# Patient Record
Sex: Male | Born: 1965
Health system: Southern US, Community
[De-identification: ages and names within clinical notes are randomized; demographics above are authoritative.]

## PROBLEM LIST (undated history)

## (undated) DIAGNOSIS — M5430 Sciatica, unspecified side: Secondary | ICD-10-CM

## (undated) DIAGNOSIS — Z86010 Personal history of colonic polyps: Secondary | ICD-10-CM

## (undated) DIAGNOSIS — G4730 Sleep apnea, unspecified: Secondary | ICD-10-CM

## (undated) DIAGNOSIS — E785 Hyperlipidemia, unspecified: Secondary | ICD-10-CM

## (undated) DIAGNOSIS — I10 Essential (primary) hypertension: Secondary | ICD-10-CM

## (undated) HISTORY — DX: Hyperlipidemia, unspecified: E78.5

## (undated) HISTORY — DX: Personal history of colonic polyps: Z86.010

## (undated) HISTORY — DX: Sciatica, unspecified side: M54.30

## (undated) HISTORY — DX: Sleep apnea, unspecified: G47.30

## (undated) HISTORY — DX: Essential (primary) hypertension: I10

---

## 2003-03-03 ENCOUNTER — Encounter: Payer: Self-pay | Admitting: Family Medicine

## 2006-06-15 ENCOUNTER — Emergency Department (HOSPITAL_COMMUNITY): Admission: EM | Admit: 2006-06-15 | Discharge: 2006-06-15 | Payer: Self-pay | Admitting: Emergency Medicine

## 2007-04-26 ENCOUNTER — Ambulatory Visit: Payer: Self-pay | Admitting: Family Medicine

## 2007-04-26 DIAGNOSIS — F17200 Nicotine dependence, unspecified, uncomplicated: Secondary | ICD-10-CM | POA: Insufficient documentation

## 2007-04-26 DIAGNOSIS — R5383 Other fatigue: Secondary | ICD-10-CM

## 2007-04-26 DIAGNOSIS — R5381 Other malaise: Secondary | ICD-10-CM

## 2007-04-26 DIAGNOSIS — R6882 Decreased libido: Secondary | ICD-10-CM | POA: Insufficient documentation

## 2007-05-02 ENCOUNTER — Encounter: Payer: Self-pay | Admitting: Family Medicine

## 2007-05-03 ENCOUNTER — Ambulatory Visit: Payer: Self-pay | Admitting: Family Medicine

## 2007-05-03 DIAGNOSIS — E782 Mixed hyperlipidemia: Secondary | ICD-10-CM | POA: Insufficient documentation

## 2007-05-03 LAB — CONVERTED CEMR LAB
BUN: 11 mg/dL (ref 6–23)
Basophils Absolute: 0.1 10*3/uL (ref 0.0–0.1)
Basophils Relative: 1.2 % — ABNORMAL HIGH (ref 0.0–1.0)
CO2: 31 meq/L (ref 19–32)
Calcium: 9 mg/dL (ref 8.4–10.5)
Cholesterol: 223 mg/dL (ref 0–200)
Creatinine, Ser: 0.8 mg/dL (ref 0.4–1.5)
Direct LDL: 146.1 mg/dL
HDL: 27.6 mg/dL — ABNORMAL LOW (ref >39.0)
MCHC: 34.4 g/dL (ref 30.0–36.0)
Monocytes Relative: 12.9 % — ABNORMAL HIGH (ref 3.0–11.0)
Platelets: 225 10*3/uL (ref 150–400)
RBC: 4.93 M/uL (ref 4.22–5.81)
RDW: 13 % (ref 11.5–14.6)
TSH: 0.68 microintl units/mL (ref 0.35–5.50)
Total CHOL/HDL Ratio: 8.1
Triglycerides: 329 mg/dL (ref 0–149)
VLDL: 66 mg/dL — ABNORMAL HIGH (ref 0–40)
Vitamin B-12: 314 pg/mL (ref 211–911)

## 2007-05-06 LAB — CONVERTED CEMR LAB
Sex Hormone Binding: 26 nmol/L (ref 13–71)
Testosterone Free: 122.1 pg/mL (ref 47.0–244.0)

## 2007-07-23 ENCOUNTER — Ambulatory Visit: Payer: Self-pay | Admitting: Family Medicine

## 2007-07-23 DIAGNOSIS — M5430 Sciatica, unspecified side: Secondary | ICD-10-CM | POA: Insufficient documentation

## 2007-12-26 ENCOUNTER — Ambulatory Visit: Payer: Self-pay | Admitting: Family Medicine

## 2007-12-31 ENCOUNTER — Encounter: Payer: Self-pay | Admitting: Family Medicine

## 2008-02-28 ENCOUNTER — Ambulatory Visit: Payer: Self-pay | Admitting: Family Medicine

## 2008-02-28 DIAGNOSIS — R109 Unspecified abdominal pain: Secondary | ICD-10-CM

## 2008-02-28 LAB — CONVERTED CEMR LAB
BUN: 10 mg/dL (ref 6–23)
Chloride: 104 meq/L (ref 96–112)
Creatinine, Ser: 0.9 mg/dL (ref 0.4–1.5)
GFR calc Af Amer: 119 mL/min
GFR calc non Af Amer: 98 mL/min
Glucose, Bld: 100 mg/dL — ABNORMAL HIGH (ref 70–99)
Potassium: 5 meq/L (ref 3.5–5.1)

## 2008-03-02 ENCOUNTER — Ambulatory Visit: Payer: Self-pay | Admitting: Internal Medicine

## 2008-03-03 DIAGNOSIS — R1900 Intra-abdominal and pelvic swelling, mass and lump, unspecified site: Secondary | ICD-10-CM

## 2008-05-21 ENCOUNTER — Ambulatory Visit: Payer: Self-pay | Admitting: Family Medicine

## 2008-05-21 DIAGNOSIS — J069 Acute upper respiratory infection, unspecified: Secondary | ICD-10-CM | POA: Insufficient documentation

## 2008-06-10 ENCOUNTER — Ambulatory Visit: Payer: Self-pay | Admitting: Cardiology

## 2008-08-02 ENCOUNTER — Emergency Department (HOSPITAL_COMMUNITY): Admission: EM | Admit: 2008-08-02 | Discharge: 2008-08-03 | Payer: Self-pay | Admitting: Emergency Medicine

## 2008-11-23 ENCOUNTER — Emergency Department (HOSPITAL_COMMUNITY): Admission: EM | Admit: 2008-11-23 | Discharge: 2008-11-23 | Payer: Self-pay | Admitting: Emergency Medicine

## 2009-02-13 ENCOUNTER — Emergency Department (HOSPITAL_COMMUNITY): Admission: EM | Admit: 2009-02-13 | Discharge: 2009-02-13 | Payer: Self-pay | Admitting: Emergency Medicine

## 2009-08-29 ENCOUNTER — Emergency Department (HOSPITAL_COMMUNITY): Admission: EM | Admit: 2009-08-29 | Discharge: 2009-08-29 | Payer: Self-pay | Admitting: Family Medicine

## 2009-08-30 ENCOUNTER — Emergency Department (HOSPITAL_COMMUNITY): Admission: EM | Admit: 2009-08-30 | Discharge: 2009-08-31 | Payer: Self-pay | Admitting: Emergency Medicine

## 2009-08-30 ENCOUNTER — Emergency Department (HOSPITAL_COMMUNITY): Admission: EM | Admit: 2009-08-30 | Discharge: 2009-08-30 | Payer: Self-pay | Admitting: Emergency Medicine

## 2009-12-30 ENCOUNTER — Emergency Department (HOSPITAL_COMMUNITY): Admission: EM | Admit: 2009-12-30 | Discharge: 2009-12-30 | Payer: Self-pay | Admitting: Emergency Medicine

## 2010-06-14 ENCOUNTER — Ambulatory Visit: Payer: Self-pay | Admitting: Family Medicine

## 2010-06-14 DIAGNOSIS — E785 Hyperlipidemia, unspecified: Secondary | ICD-10-CM | POA: Insufficient documentation

## 2010-06-14 LAB — CONVERTED CEMR LAB
Blood in Urine, dipstick: NEGATIVE
Nitrite: NEGATIVE
Specific Gravity, Urine: 1.02
Urobilinogen, UA: 2
WBC Urine, dipstick: NEGATIVE

## 2010-06-15 LAB — CONVERTED CEMR LAB
ALT: 32 units/L (ref 0–53)
AST: 29 units/L (ref 0–37)
Albumin: 4.3 g/dL (ref 3.5–5.2)
Alkaline Phosphatase: 78 units/L (ref 39–117)
Basophils Absolute: 0 10*3/uL (ref 0.0–0.1)
Basophils Relative: 0.5 % (ref 0.0–3.0)
Calcium: 9.2 mg/dL (ref 8.4–10.5)
Eosinophils Relative: 1.8 % (ref 0.0–5.0)
GFR calc non Af Amer: 100.9 mL/min (ref >60)
Glucose, Bld: 92 mg/dL (ref 70–99)
HCT: 49 % (ref 39.0–52.0)
HDL: 36.6 mg/dL — ABNORMAL LOW (ref >39.00)
Hemoglobin: 16.7 g/dL (ref 13.0–17.0)
Lymphocytes Relative: 29.8 % (ref 12.0–46.0)
Lymphs Abs: 2.3 10*3/uL (ref 0.7–4.0)
Monocytes Relative: 11.5 % (ref 3.0–12.0)
Neutro Abs: 4.4 10*3/uL (ref 1.4–7.7)
Potassium: 4.6 meq/L (ref 3.5–5.1)
RBC: 5.12 M/uL (ref 4.22–5.81)
RDW: 14.1 % (ref 11.5–14.6)
Sodium: 140 meq/L (ref 135–145)
TSH: 0.9 microintl units/mL (ref 0.35–5.50)
Total CHOL/HDL Ratio: 7
Triglycerides: 188 mg/dL — ABNORMAL HIGH (ref 0.0–149.0)
WBC: 7.8 10*3/uL (ref 4.5–10.5)

## 2010-06-18 ENCOUNTER — Encounter: Admission: RE | Admit: 2010-06-18 | Discharge: 2010-06-18 | Payer: Self-pay | Admitting: Family Medicine

## 2010-06-22 DIAGNOSIS — M549 Dorsalgia, unspecified: Secondary | ICD-10-CM | POA: Insufficient documentation

## 2010-10-18 NOTE — Assessment & Plan Note (Signed)
Summary: PT EST (TXR FROM [facility])  OK PER DR FRY // RS   Vital Signs:  Patient profile:   45 year old male Height:      70 inches Weight:      337 pounds BMI:     48.53 Temp:     98.3 degrees F oral BP sitting:   120 / 90  (left arm) Cuff size:   regular CC: Pt to Est from [employer], src Is Patient Diabetic? No   History of Present Illness: 45 yr old male to establish with [place] and for a cpx. he had been seeing Dr. Ermalene Searing, but he switched to here because it is closer to his home and job. he is fasting today. His main compaint is chronic right sided low back pain and pain that radiates down the back of the right leg to the foot. He also has numbness down the right leg off and on. The pain is mostly a dull ache, but it can be more severe at times. No hx of trauma. This has been going on for about 3 years. He tried PT for it a few years ago, but this did not help.  He takes Motrin as needed . Because he cannot exercise the way he used to, he has put in around 100 lbs of weight over the past few years.   Preventive Screening-Counseling & Management  Alcohol-Tobacco     Smoking Status: current     Smoking Cessation Counseling: YES     Packs/Day: 0.5  Current Medications (verified): 1)  Multivitamins   Tabs (Multiple Vitamin) .... Once Daily  Allergies (verified): No Known Drug Allergies  Past History:  Past Medical History: sciatica Hyperlipidemia  Past Surgical History: Reviewed history from 04/26/2007 and no changes required. Denies surgical history  Family History: Reviewed history from 04/26/2007 and no changes required. father age  9 DM mother age 31 healthy 3 sisters, 1 brother: healthy MGF: DM, MI  uncle with  MI < 24, uncle with throat ca  Social History: Reviewed history from 04/26/2007 and no changes required. 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, longterm no abuse Current Smoker 1/2 ppd x 28 years Alcohol use-yes, 2 beers  per week Drug use-yes remote marijuana, cocaine, LSD age 16 Regular exercise-yes wlaks 45 min daily, [employer] occ Diet: fruit veggies, skips meals a lot  Packs/Day:  0.5  Review of Systems  The patient denies anorexia, fever, weight loss, vision loss, decreased hearing, hoarseness, chest pain, syncope, dyspnea on exertion, peripheral edema, prolonged cough, headaches, hemoptysis, abdominal pain, melena, hematochezia, severe indigestion/heartburn, hematuria, incontinence, genital sores, muscle weakness, suspicious skin lesions, transient blindness, difficulty walking, depression, unusual weight change, abnormal bleeding, enlarged lymph nodes, angioedema, breast masses, and testicular masses.    Physical Exam  General:  morbidly obese  Head:  Normocephalic and atraumatic without obvious abnormalities. No apparent alopecia or balding. Eyes:  No corneal or conjunctival inflammation noted. EOMI. Perrla. Funduscopic exam benign, without hemorrhages, exudates or papilledema. Vision grossly normal. Ears:  External ear exam shows no significant lesions or deformities.  Otoscopic examination reveals clear canals, tympanic membranes are intact bilaterally without bulging, retraction, inflammation or discharge. Hearing is grossly normal bilaterally. Nose:  External nasal examination shows no deformity or inflammation. Nasal mucosa are pink and moist without lesions or exudates. Mouth:  Oral mucosa and oropharynx without lesions or exudates.  Teeth in good repair. Neck:  No deformities, masses, or tenderness noted. Chest Wall:  No deformities,  masses, tenderness or gynecomastia noted. Lungs:  Normal respiratory effort, chest expands symmetrically. Lungs are clear to auscultation, no crackles or wheezes. Heart:  Normal rate and regular rhythm. S1 and S2 normal without gallop, murmur, click, rub or other extra sounds. Abdomen:  Bowel sounds positive,abdomen soft and non-tender without masses, organomegaly or  hernias noted. Genitalia:  Testes bilaterally descended without nodularity, tenderness or masses. No scrotal masses or lesions. No penis lesions or urethral discharge. Msk:  No deformity or scoliosis noted of thoracic or lumbar spine.  Not tender in the back but he is tender in the right sciatic notch. Negative SLR  Pulses:  R and L carotid,radial,femoral,dorsalis pedis and posterior tibial pulses are full and equal bilaterally Extremities:  No clubbing, cyanosis, edema, or deformity noted with normal full range of motion of all joints.   Neurologic:  No cranial nerve deficits noted. Station and gait are normal. Plantar reflexes are down-going bilaterally. DTRs are symmetrical throughout. Sensory, motor and coordinative functions appear intact. Skin:  Intact without suspicious lesions or rashes Cervical Nodes:  No lymphadenopathy noted Axillary Nodes:  No palpable lymphadenopathy Inguinal Nodes:  No significant adenopathy Psych:  Cognition and judgment appear intact. Alert and cooperative with normal attention span and concentration. No apparent delusions, illusions, hallucinations   Impression & Recommendations:  Problem # 1:  HEALTH MAINTENANCE EXAM (ICD-V70.0)  Orders: UA Dipstick w/o Micro (automated)  (81003) Venipuncture (42595) TLB-Lipid Panel (80061-LIPID) TLB-BMP (Basic Metabolic Panel-BMET) (80048-METABOL) TLB-CBC Platelet - w/Differential (85025-CBCD) TLB-Hepatic/Liver Function Pnl (80076-HEPATIC) TLB-TSH (Thyroid Stimulating Hormone) (84443-TSH) Specimen Handling (63875)  Problem # 2:  SCIATICA, RIGHT (ICD-724.3)  Complete Medication List: 1)  Multivitamins Tabs (Multiple vitamin) .... Once daily  Patient Instructions: 1)  get labs today.  2)  Tobacco is very bad for your health and your loved ones ! You should stop smoking !  3)  It is important that you exercise reguarly at least 20 minutes 5 times a week. If you develop chest pain, have severe difficulty breathing,  or feel very tired, stop exercising immediately and seek medical attention.  4)  You need to lose weight. Consider a lower calorie diet and regular exercise.  5)  We will set up an MRI of the lumbar spine soon  Laboratory Results   Urine Tests  Date/Time Recieved: June 14, 2010 12:19 PM  Date/Time Reported: June 14, 2010 12:19 PM   Routine Urinalysis   Color: yellow Appearance: Clear Glucose: negative   (Normal Range: Negative) Bilirubin: 1+   (Normal Range: Negative) Ketone: trace (5)   (Normal Range: Negative) Spec. Gravity: 1.020   (Normal Range: 1.003-1.035) Blood: negative   (Normal Range: Negative) pH: 7.0   (Normal Range: 5.0-8.0) Protein: 1+   (Normal Range: Negative) Urobilinogen: 2.0   (Normal Range: 0-1) Nitrite: negative   (Normal Range: Negative) Leukocyte Esterace: negative   (Normal Range: Negative)    Comments: Wynona Canes, CMA  June 14, 2010 12:19 PM

## 2010-10-18 NOTE — Miscellaneous (Signed)
Summary: Orders Update   Clinical Lists Changes  Orders: Added new Referral order of Radiology Referral (Radiology) - Signed 

## 2011-02-03 NOTE — Assessment & Plan Note (Signed)
[facility]                                 ON-CALL NOTE   Jeremy, Guerrero                      MRN:          [id number]  DATE:05/24/2008                            DOB:          Jul 04, 1966    DATE OF INTERACTION:  May 24, 2008 at 5:11 p.m.   PHONE NUMBER:  [phone].   CALLER:  Jorja Loa, [facility].   OBJECTIVE:  Pharmacist has a question, was given Hyco-tuss the other day  for cough and the patient said that he was told to use Zithromax if he  did not improve.  He cannot find his prescription.  He says, he gave it  to the pharmacy.  The pharmacy cannot find it either and they want to  okay to give him a prescription for his Zithromax.  I gave him a verbal  order.  I gave him a Z-PAK as directed.   Primary care Christie Copley is Dr. Ermalene Searing.  Home office is [facility].     Arta Silence, MD  Electronically Signed    RNS/MedQ  DD: 05/24/2008  DT: 05/25/2008  Job #: [id number]

## 2011-04-03 ENCOUNTER — Ambulatory Visit (INDEPENDENT_AMBULATORY_CARE_PROVIDER_SITE_OTHER)
Admission: RE | Admit: 2011-04-03 | Discharge: 2011-04-03 | Disposition: A | Discharge status: Home / Self Care | Payer: PRIVATE HEALTH INSURANCE | Source: Ambulatory Visit | Attending: Family Medicine | Admitting: Family Medicine

## 2011-04-03 ENCOUNTER — Ambulatory Visit (INDEPENDENT_AMBULATORY_CARE_PROVIDER_SITE_OTHER): Payer: PRIVATE HEALTH INSURANCE | Admitting: Family Medicine

## 2011-04-03 ENCOUNTER — Encounter: Payer: Self-pay | Admitting: Family Medicine

## 2011-04-03 VITALS — BP 126/82 | HR 81 | Temp 98.6°F | Wt 348.0 lb

## 2011-04-03 DIAGNOSIS — S9030XA Contusion of unspecified foot, initial encounter: Secondary | ICD-10-CM

## 2011-04-03 NOTE — Progress Notes (Signed)
  Subjective:    Patient ID: Jeremy Guerrero, male    DOB: [birth year], 45 y.o.   MRN: [id number]  HPI Here for an injury to the left foot which occurred at home 2 days ago. He was walking in the dark and ran the foot into an ottoman. He has had pain and swelling in the forefoot ever since. Taking Aleve and using ice.    Review of Systems  Constitutional: Negative.   Musculoskeletal: Positive for joint swelling.       Objective:   Physical Exam  Constitutional:       Walks with a limp   Musculoskeletal:       The left foot is swollen. The 4th toe is ecchymotic and tender. Alignment is good. The 4th MTP joint is quite tender.           Assessment & Plan:  Probable fracture of the toe or metatarsal. Wear supportive shoes. Get Xrays today

## 2011-04-07 ENCOUNTER — Telehealth: Payer: Self-pay | Admitting: *Deleted

## 2011-04-07 ENCOUNTER — Telehealth: Payer: Self-pay

## 2011-04-07 NOTE — Telephone Encounter (Signed)
Talked to patient about x-ray.

## 2011-04-07 NOTE — Telephone Encounter (Signed)
Message copied by Beverely Low on Fri Apr 07, 2011  3:18 PM ------      Message from: Gershon Crane A      Created: Wed Apr 05, 2011  4:59 AM       There is a fracture in the 4th toe at the joint, as expected, but the foot bone is okay. No need for casting. Wear supportive shoes and give it time to heal. Recheck prn

## 2011-04-07 NOTE — Telephone Encounter (Signed)
LMTCB

## 2011-04-13 ENCOUNTER — Telehealth: Payer: Self-pay | Admitting: Family Medicine

## 2011-04-13 NOTE — Telephone Encounter (Signed)
Left voice message with results.

## 2011-04-13 NOTE — Telephone Encounter (Signed)
Message copied by Baldemar Friday on Thu Apr 13, 2011  5:10 PM ------      Message from: Gershon Crane A      Created: Wed Apr 05, 2011  4:59 AM       There is a fracture in the 4th toe at the joint, as expected, but the foot bone is okay. No need for casting. Wear supportive shoes and give it time to heal. Recheck prn

## 2011-06-21 LAB — POCT I-STAT, CHEM 8
BUN: 10
Creatinine, Ser: 1.2
Glucose, Bld: 114 — ABNORMAL HIGH
Sodium: 139
TCO2: 28

## 2011-08-14 ENCOUNTER — Emergency Department (HOSPITAL_COMMUNITY)
Admission: EM | Admit: 2011-08-14 | Discharge: 2011-08-14 | Disposition: A | Discharge status: Home / Self Care | Payer: PRIVATE HEALTH INSURANCE | Attending: Emergency Medicine | Admitting: Emergency Medicine

## 2011-08-14 ENCOUNTER — Encounter (HOSPITAL_COMMUNITY): Payer: Self-pay | Admitting: *Deleted

## 2011-08-14 DIAGNOSIS — R0602 Shortness of breath: Secondary | ICD-10-CM | POA: Insufficient documentation

## 2011-08-14 DIAGNOSIS — R110 Nausea: Secondary | ICD-10-CM | POA: Insufficient documentation

## 2011-08-14 DIAGNOSIS — J029 Acute pharyngitis, unspecified: Secondary | ICD-10-CM

## 2011-08-14 DIAGNOSIS — H6690 Otitis media, unspecified, unspecified ear: Secondary | ICD-10-CM

## 2011-08-14 DIAGNOSIS — J069 Acute upper respiratory infection, unspecified: Secondary | ICD-10-CM | POA: Insufficient documentation

## 2011-08-14 DIAGNOSIS — F17200 Nicotine dependence, unspecified, uncomplicated: Secondary | ICD-10-CM | POA: Insufficient documentation

## 2011-08-14 DIAGNOSIS — H9209 Otalgia, unspecified ear: Secondary | ICD-10-CM | POA: Insufficient documentation

## 2011-08-14 MED ORDER — IBUPROFEN 800 MG PO TABS
800.0000 mg | ORAL_TABLET | Freq: Once | ORAL | Status: AC
  Administered 2011-08-14: 800 mg via ORAL
  Filled 2011-08-14: qty 1

## 2011-08-14 MED ORDER — AZITHROMYCIN 250 MG PO TABS: 250.0000 mg | ORAL_TABLET | Freq: Every day | ORAL | Status: AC

## 2011-08-14 MED ORDER — HYDROCOD POLST-CHLORPHEN POLST 10-8 MG/5ML PO LQCR: 5.0000 mL | Freq: Two times a day (BID) | ORAL | Status: DC

## 2011-08-14 MED ORDER — ALBUTEROL SULFATE HFA 108 (90 BASE) MCG/ACT IN AERS: 2.0000 | INHALATION_SPRAY | Freq: Four times a day (QID) | RESPIRATORY_TRACT | Status: DC

## 2011-08-14 MED ORDER — PENICILLIN G BENZATHINE 1200000 UNIT/2ML IM SUSP
1.2000 10*6.[IU] | Freq: Once | INTRAMUSCULAR | Status: AC
  Administered 2011-08-14: 1.2 10*6.[IU] via INTRAMUSCULAR
  Filled 2011-08-14 (×2): qty 2

## 2011-08-14 MED ORDER — ALBUTEROL SULFATE HFA 108 (90 BASE) MCG/ACT IN AERS
1.0000 | INHALATION_SPRAY | Freq: Four times a day (QID) | RESPIRATORY_TRACT | Status: DC | PRN
Start: 2011-08-14 — End: 2016-02-09

## 2011-08-14 NOTE — ED Provider Notes (Addendum)
History     CSN: [id number] Arrival date & time: 08/14/2011  6:12 AM   First MD Initiated Contact with Patient 08/14/11 [phone]      Chief Complaint  Patient presents with  . Otalgia  . Sore Throat  . Nausea  . Fever  . Shortness of Breath   patient had sore throat, beginning on Wednesday. Other symptoms began Thursday night, including earache, sore throat, nausea.  He states the sore throat is worsening. He is also having a left-sided earache. He is a smoker, but denies any other past medical history. He has had low-grade temps only. She denies any chest pain or any exertional dyspnea. He's had no back pain. No headache. Patient denies any recent sick contacts.  (Consider location/radiation/quality/duration/timing/severity/associated sxs/prior treatment) HPI  History reviewed. No pertinent past medical history.  History reviewed. No pertinent past surgical history.  History reviewed. No pertinent family history.  History  Substance Use Topics  . Smoking status: Current Everyday Smoker -- 0.5 packs/day    Types: Cigarettes  . Smokeless tobacco: Never Used  . Alcohol Use: Yes     5 beers a month      Review of Systems  All other systems reviewed and are negative.    Allergies  Review of patient's allergies indicates no known allergies.  Home Medications   Current Outpatient Rx  Name Route Sig Dispense Refill  . CHOLESTEROL DEFENSE PO Oral Take by mouth daily.        BP 139/74  Pulse 112  Temp(Src) 100.7 F (38.2 C) (Oral)  Resp 20  SpO2 93%  Physical Exam  Constitutional: He is oriented to person, place, and time. He appears well-developed and well-nourished. No distress.       Large frame male who is awake, alert, oriented, no acute distress. Nontoxic in appearance. He is speaking comfortably. No diaphoresis.  HENT:  Head: Normocephalic and atraumatic.  Right Ear: External ear normal.  Mouth/Throat: Oropharyngeal exudate present.       Oral pharynx  clear. Other than trace exudate on the left tonsillar pillar, airways patent, no abscess. Left tympanic membrane is erythematous.  Eyes: Conjunctivae and EOM are normal. Pupils are equal, round, and reactive to light.  Neck: Normal range of motion. Neck supple. No tracheal deviation present. No thyromegaly present.  Cardiovascular: Normal rate and regular rhythm.  Exam reveals no gallop and no friction rub.   No murmur heard. Pulmonary/Chest: Breath sounds normal. No respiratory distress. He has no wheezes. He has no rales. He exhibits no tenderness.  Abdominal: Soft. Bowel sounds are normal. He exhibits no distension. There is no tenderness. There is no rebound and no guarding.  Musculoskeletal: Normal range of motion. He exhibits no edema and no tenderness.  Lymphadenopathy:    He has no cervical adenopathy.  Neurological: He is alert and oriented to person, place, and time. No cranial nerve deficit. Coordination normal.  Skin: Skin is warm and dry. No rash noted.  Psychiatric: He has a normal mood and affect.    ED Course  Procedures (including critical care time)   Labs Reviewed  RAPID STREP SCREEN   No results found.   No diagnosis found.    MDM  Pt is seen and examined;  Initial history and physical completed.  Will follow.          Kesleigh Morson A. Patrica Duel, MD 08/14/11 0725  Results for orders placed during the hospital encounter of 08/14/11  RAPID STREP SCREEN  Component Value Range   Streptococcus, Group A Screen (Direct) NEGATIVE  NEGATIVE    No results found.    Evelynne Spiers A. Patrica Duel, MD 08/14/11 [phone]

## 2011-08-14 NOTE — Discharge Instructions (Signed)
Pharyngitis, Viral and Bacterial Pharyngitis is soreness (inflammation) or infection of the pharynx. It is also called a sore throat. CAUSES  Most sore throats are caused by viruses and are part of a cold. However, some sore throats are caused by strep and other bacteria. Sore throats can also be caused by post nasal drip from draining sinuses, allergies and sometimes from sleeping with an open mouth. Infectious sore throats can be spread from person to person by coughing, sneezing and sharing cups or eating utensils. TREATMENT  Sore throats that are viral usually last 3-4 days. Viral illness will get better without medications (antibiotics). Strep throat and other bacterial infections will usually begin to get better about 24-48 hours after you begin to take antibiotics. HOME CARE INSTRUCTIONS   If the caregiver feels there is a bacterial infection or if there is a positive strep test, they will prescribe an antibiotic. The full course of antibiotics must be taken. If the full course of antibiotic is not taken, you or your child may become ill again. If you or your child has strep throat and do not finish all of the medication, serious heart or kidney diseases may develop.   Drink enough water and fluids to keep your urine clear or pale yellow.   Only take over-the-counter or prescription medicines for pain, discomfort or fever as directed by your caregiver.   Get lots of rest.   Gargle with salt water ( tsp. of salt in a glass of water) as often as every 1-2 hours as you need for comfort.   Hard candies may soothe the throat if individual is not at risk for choking. Throat sprays or lozenges may also be used.  SEEK MEDICAL CARE IF:   Large, tender lumps in the neck develop.   A rash develops.   Green, yellow-brown or bloody sputum is coughed up.   Your baby is older than 3 months with a rectal temperature of 100.5 F (38.1 C) or higher for more than 1 day.  SEEK IMMEDIATE MEDICAL CARE  IF:   A stiff neck develops.   You or your child are drooling or unable to swallow liquids.   You or your child are vomiting, unable to keep medications or liquids down.   You or your child has severe pain, unrelieved with recommended medications.   You or your child are having difficulty breathing (not due to stuffy nose).   You or your child are unable to fully open your mouth.   You or your child develop redness, swelling, or severe pain anywhere on the neck.   You have a fever.   Your baby is older than 3 months with a rectal temperature of 102 F (38.9 C) or higher.   Your baby is 37 months old or younger with a rectal temperature of 100.4 F (38 C) or higher.  MAKE SURE YOU:   Understand these instructions.   Will watch your condition.   Will get help right away if you are not doing well or get worse.  Document Released: 09/04/2005 Document Revised: 05/17/2011 Document Reviewed: 12/02/2007 [facility] Patient Information 2012 [place], [place].   Otitis Media, Adult A middle ear infection is an infection in the space behind the eardrum. The medical name for this is "otitis media." It may happen after a common cold. It is caused by a germ that starts growing in that space. You may feel swollen glands in your neck on the side of the ear infection. HOME CARE INSTRUCTIONS  Take your medicine as directed until it is gone, even if you feel better after the first few days.   Only take over-the-counter or prescription medicines for pain, discomfort, or fever as directed by your caregiver.   Occasional use of a nasal decongestant a couple times per day may help with discomfort and help the eustachian tube to drain better.  Follow up with your caregiver in 10 to 14 days or as directed, to be certain that the infection has cleared. Not keeping the appointment could result in a chronic or permanent injury, pain, hearing loss and disability. If there is any problem keeping the  appointment, you must call back to this facility for assistance. SEEK IMMEDIATE MEDICAL CARE IF:   You are not getting better in 2 to 3 days.   You have pain that is not controlled with medication.   You feel worse instead of better.   You cannot use the medication as directed.   You develop swelling, redness or pain around the ear or stiffness in your neck.  MAKE SURE YOU:   Understand these instructions.   Will watch your condition.   Will get help right away if you are not doing well or get worse.  Document Released: 06/09/2004 Document Revised: 05/17/2011 Document Reviewed: 04/10/2008 [facility] Patient Information 2012 [place], [place].   RESOURCE GUIDE  Dental Problems  Patients with Medicaid: [facility] [phone] [address]                                           [phone] [address] Phone:  [phone]                                                  Phone:  [phone]  If unable to pay or uninsured, contact:  [facility] or [facility]. to become qualified for the adult dental clinic.  Chronic Pain Problems Contact [facility]  [phone] Patients need to be referred by their primary care doctor.  Insufficient Money for Medicine Contact United Way:  call "211" or [facility] 325-735-7688.  No Primary Care Doctor Call [facility]  [phone] Other agencies that provide inexpensive medical care    [facility]  [phone]    [facility]  [phone]    [facility]  [phone]    [facility]  [phone]    [facility]  [phone]    [facility]  [phone]  Psychological Services [facility]  [phone] [facility] Services  [phone] [facility]   [phone] (emergency services 7793606554)  Substance Abuse Resources [facility]  [phone] [facility]  [phone] [facility] [phone] Floydene Flock [phone] Residential & Outpatient Substance Abuse Program  ([phone]  Abuse/Neglect [facility] Child Abuse Hotline 4236375511 [facility] Child Abuse Hotline 854-519-4459 (After Hours)  Emergency Shelter [facility] (307)[phone]  Maternity Homes Room at the [place] [facility] [place] [phone] [facility] Services [phone]  MRSA Hotline #:   2243662253  [facility]  [facility]     [employer]                          [facility] Dept. [address] [place]                       [address]      [address]  Blondell Reveal Phone:  [phone]                                   Phone:  [phone]                 Phone:  [phone]  [facility] Phone:  [phone]  [facility] Child Abuse Hotline [phone] (After Hours)

## 2011-08-14 NOTE — ED Notes (Signed)
Pt c/o sore throat starting on Wed. All other s/s started Thursday night.

## 2011-08-14 NOTE — ED Notes (Signed)
Called called pharmacy x3 because albuterol is unavailable in this pyxis. Still no albuterol, notified md and he wrote for prescription

## 2011-08-15 ENCOUNTER — Encounter: Payer: Self-pay | Admitting: Family Medicine

## 2011-08-15 ENCOUNTER — Ambulatory Visit (INDEPENDENT_AMBULATORY_CARE_PROVIDER_SITE_OTHER): Payer: PRIVATE HEALTH INSURANCE | Admitting: Family Medicine

## 2011-08-15 VITALS — BP 120/84 | HR 98 | Temp 99.1°F | Wt 340.0 lb

## 2011-08-15 DIAGNOSIS — J40 Bronchitis, not specified as acute or chronic: Secondary | ICD-10-CM

## 2011-08-15 NOTE — Progress Notes (Signed)
  Subjective:    Patient ID: Jeremy Guerrero, male    DOB: Oct 24, 1965, 45 y.o.   MRN: [id number]  HPI Here for 5 days of fevers to 102 degrees, chest tightness, coughing up yellow sputum, and a ST. He went to the ER yesterday and was given a shot of Bicillin and a Zpack. He is using an inhaler prn. Today he feels no better. Drinking fluids and taking Tylenol   Review of Systems  Constitutional: Positive for fever.  HENT: Negative.   Eyes: Negative.   Respiratory: Positive for cough and chest tightness.        Objective:   Physical Exam  Constitutional: He appears well-developed and well-nourished.  HENT:  Right Ear: External ear normal.  Left Ear: External ear normal.  Nose: Nose normal.  Mouth/Throat: Oropharynx is clear and moist. No oropharyngeal exudate.  Eyes: Conjunctivae are normal.  Neck: No thyromegaly present.  Pulmonary/Chest: Effort normal. No respiratory distress. He has no wheezes. He has no rales.  Lymphadenopathy:    He has no cervical adenopathy.          Assessment & Plan:  Continue with the current meds. We will add a shot of Depomedrol today. Off work yesterday and today

## 2011-12-04 ENCOUNTER — Ambulatory Visit (INDEPENDENT_AMBULATORY_CARE_PROVIDER_SITE_OTHER): Payer: 59 | Admitting: Family

## 2011-12-04 ENCOUNTER — Encounter: Payer: Self-pay | Admitting: Family

## 2011-12-04 VITALS — BP 120/76 | Temp 98.7°F | Resp 18

## 2011-12-04 DIAGNOSIS — J069 Acute upper respiratory infection, unspecified: Secondary | ICD-10-CM

## 2011-12-04 DIAGNOSIS — R0982 Postnasal drip: Secondary | ICD-10-CM

## 2011-12-04 MED ORDER — FEXOFENADINE-PSEUDOEPHED ER 180-240 MG PO TB24: 1.0000 | ORAL_TABLET | Freq: Every day | ORAL | Status: DC

## 2011-12-04 MED ORDER — AMOXICILLIN 500 MG PO TABS: 1000.0000 mg | ORAL_TABLET | Freq: Two times a day (BID) | ORAL | Status: AC

## 2011-12-04 NOTE — Progress Notes (Signed)
Subjective:    Patient ID: Jeremy Guerrero, male    DOB: [birth year], 46 y.o.   MRN: [id number]  HPI 46 year old white male, half a pack per day smoker, patient of Dr. Clent Ridges his in with complaints of sore throat, sinus pressure and she'll and one on for one day. He is taking 1 Mucinex with no relief. Denies any sneezing, coughing, or congestion. Patient is concerned that he is getting sick and he is headed out of town. Patient reports a history of strep pharyngitis but upon review of the notes his strep test was negative. Denies any lightheadedness, dizziness, chest pain, palpitations, shortness of breath or edema.   Review of Systems  Constitutional: Positive for chills.  HENT: Positive for ear pain, sore throat and postnasal drip. Negative for sneezing.   Eyes: Negative.   Respiratory: Negative.  Negative for cough and wheezing.   Cardiovascular: Negative.   Gastrointestinal: Negative.   Skin: Negative.   Neurological: Negative.   Psychiatric/Behavioral: Negative.    Past Medical History  Diagnosis Date  . Sciatica   . Hyperlipidemia     History   Social History  . Marital Status: Single    Spouse Name: N/A    Number of Children: N/A  . Years of Education: N/A   Occupational History  . Not on file.   Social History Main Topics  . Smoking status: Current Everyday Smoker -- 0.5 packs/day    Types: Cigarettes  . Smokeless tobacco: Never Used  . Alcohol Use: Yes     5 beers a month  . Drug Use: No  . Sexually Active: Yes    Birth Control/ Protection: None   Other Topics Concern  . Not on file   Social History Narrative  . No narrative on file    No past surgical history on file.  Family History  Problem Relation Age of Onset  . Healthy Mother   . Diabetes Father   . Healthy Sister   . Healthy Brother   . Diabetes Maternal Grandfather   . Heart attack Maternal Grandfather   . Healthy Sister   . Healthy Sister   . Heart attack Other   . Cancer Other    throat    No Known Allergies  Current Outpatient Prescriptions on File Prior to Visit  Medication Sig Dispense Refill  . albuterol (PROVENTIL HFA;VENTOLIN HFA) 108 (90 BASE) MCG/ACT inhaler Inhale 1-2 puffs into the lungs every 6 (six) hours as needed for wheezing.  1 Inhaler  0  . guaiFENesin (MUCINEX) 600 MG 12 hr tablet Take 600 mg by mouth 2 (two) times daily.        . Nutritional Supplements (CHOLESTEROL DEFENSE PO) Take 1 tablet by mouth daily.       . Pseudoeph-Doxylamine-DM-APAP (NYQUIL PO) Take 5 mLs by mouth at bedtime as needed. Cough        . Pseudoephedrine-APAP-DM (DAYQUIL PO) Take 5 mLs by mouth daily as needed. Cough        . chlorpheniramine-HYDROcodone (TUSSIONEX PENNKINETIC ER) 10-8 MG/5ML LQCR Take 5 mLs by mouth every 12 (twelve) hours.  100 mL  0    BP 120/76  Temp(Src) 98.7 F (37.1 C) (Oral)  Resp 18chart    Objective:   Physical Exam  Constitutional: He is oriented to person, place, and time. He appears well-developed and well-nourished.  HENT:  Right Ear: External ear normal.  Left Ear: External ear normal.  Mouth/Throat: Oropharynx is clear and moist.  Neck: Normal range of  motion. Neck supple.  Cardiovascular: Normal rate, regular rhythm and normal heart sounds.   Pulmonary/Chest: Effort normal and breath sounds normal.  Musculoskeletal: Normal range of motion.  Neurological: He is alert and oriented to person, place, and time.  Skin: Skin is warm and dry.  Psychiatric: He has a normal mood and affect.          Assessment & Plan:  Assessment: Upper respiratory infection, post nasal drip  Plan: I had discussed with patient that I don't believe he needs antibiotics at this point since his symptoms started last night. I have advised Allegra-D once a day. Since he is going out of town, I will write a prescription from [facility] but only be taken if his symptoms worsen or persist. Recheck as scheduled and when necessary.

## 2011-12-04 NOTE — Patient Instructions (Signed)

## 2012-06-27 ENCOUNTER — Emergency Department (HOSPITAL_COMMUNITY)
Admission: EM | Admit: 2012-06-27 | Discharge: 2012-06-28 | Disposition: A | Discharge status: Home / Self Care | Payer: PRIVATE HEALTH INSURANCE | Attending: Emergency Medicine | Admitting: Emergency Medicine

## 2012-06-27 ENCOUNTER — Encounter (HOSPITAL_COMMUNITY): Payer: Self-pay | Admitting: *Deleted

## 2012-06-27 DIAGNOSIS — Z808 Family history of malignant neoplasm of other organs or systems: Secondary | ICD-10-CM | POA: Insufficient documentation

## 2012-06-27 DIAGNOSIS — F17200 Nicotine dependence, unspecified, uncomplicated: Secondary | ICD-10-CM | POA: Insufficient documentation

## 2012-06-27 DIAGNOSIS — Z8249 Family history of ischemic heart disease and other diseases of the circulatory system: Secondary | ICD-10-CM | POA: Insufficient documentation

## 2012-06-27 DIAGNOSIS — L089 Local infection of the skin and subcutaneous tissue, unspecified: Secondary | ICD-10-CM | POA: Insufficient documentation

## 2012-06-27 DIAGNOSIS — Z833 Family history of diabetes mellitus: Secondary | ICD-10-CM | POA: Insufficient documentation

## 2012-06-27 NOTE — ED Notes (Signed)
Pt has a mole to upper right chest he tried to cut off with a razor blade; states had a lot of bleeding at the time; no bleeding now

## 2012-06-28 NOTE — ED Provider Notes (Signed)
History     CSN: [id number]  Arrival date & time 06/27/12  2028   First MD Initiated Contact with Patient 06/27/12 2223      Chief Complaint  Patient presents with  . Laceration    (Consider location/radiation/quality/duration/timing/severity/associated sxs/prior treatment) HPI Comments: Jeremy Guerrero is a 46 y.o. Male who presents with complaint of a tender, infected, bleeding "skin tag." Pt state he had a flesh colored skin tag to the right neck. States had it for "years" but it has been gradually getting larger. States about a week ago, he read on the Internet that he could tie a string around it and it would fall off. Pt states he did that. Few days ago, noted that the tag changed color to a purple-reddish tint, and became tender. Today it started draining puss from where string was tied. States he then decided to use a razor and cut it off. States he made a small cut, but started bleeding and could not stop the bleeding with pressure so decided to come here. Pt denies easy bleeding or bruising, no blood thinners. No fever, chills, malaise. No other complaints.    Past Medical History  Diagnosis Date  . Sciatica   . Hyperlipidemia     History reviewed. No pertinent past surgical history.  Family History  Problem Relation Age of Onset  . Healthy Mother   . Diabetes Father   . Healthy Sister   . Healthy Brother   . Diabetes Maternal Grandfather   . Heart attack Maternal Grandfather   . Healthy Sister   . Healthy Sister   . Heart attack Other   . Cancer Other     throat    History  Substance Use Topics  . Smoking status: Current Every Day Smoker -- 0.5 packs/day    Types: Cigarettes  . Smokeless tobacco: Never Used  . Alcohol Use: Yes     5 beers a month      Review of Systems  Constitutional: Negative for fever and chills.  Respiratory: Negative.   Cardiovascular: Negative.   Skin: Positive for color change and wound.  Neurological: Negative for weakness  and numbness.  Hematological: Does not bruise/bleed easily.    Allergies  Review of patient's allergies indicates no known allergies.  Home Medications   Current Outpatient Rx  Name Route Sig Dispense Refill  . ALBUTEROL SULFATE HFA 108 (90 BASE) MCG/ACT IN AERS Inhalation Inhale 1-2 puffs into the lungs every 6 (six) hours as needed for wheezing. 1 Inhaler 0  . ADULT MULTIVITAMIN W/MINERALS CH Oral Take 1 tablet by mouth daily.    . CHOLESTEROL DEFENSE PO Oral Take 1 tablet by mouth daily.       BP 120/75  Pulse 76  Temp 98.4 F (36.9 C) (Oral)  Resp 18  SpO2 92%  Physical Exam  Nursing note and vitals reviewed. Constitutional: He appears well-developed and well-nourished. No distress.  Cardiovascular: Normal rate, regular rhythm and normal heart sounds.   Pulmonary/Chest: Effort normal and breath sounds normal. No respiratory distress. He has no wheezes. He has no rales.  Skin: Skin is warm and dry.       2 x2 cm pedunculated skin lesion to the right lower neck. It is purplish in color. There is a thread tied tightly around it. It is tender to palpation. There is minimal bleeding from the base of the lesion. No surrounding swelling or erythema. No purulent drainage noted.   Psychiatric: He has a normal mood  and affect.    ED Course  Procedures (including critical care time)   Excision of a skin lesion Performed by: Jaynie Crumble A Consent: Verbal consent obtained. Risks and benefits: risks, benefits and alternatives were discussed Type: skin growths  Body area: left neck  Anesthesia: local infiltration  Local anesthetic: lidocaine 2% w/ epinephrine  Anesthetic total: 2 ml  Using a #10 blade i removed the skin tag/lesion. Wound irrigated with saline. Closed with prolene 5.0, 3 stitches, simple interrupted. Wound hemostatic.    Patient tolerance: Patient tolerated the procedure well with no immediate complications.   Specimen removed sent to pathology.      1. Infected lesion of skin       MDM          Lottie Mussel, PA 06/28/12 0107

## 2012-06-28 NOTE — ED Notes (Signed)
Dressing applied to pt's right side of neck per PA request. Pt tolerated well.

## 2012-06-29 NOTE — ED Provider Notes (Signed)
Medical screening examination/treatment/procedure(s) were performed by non-physician practitioner and as supervising physician I was immediately available for consultation/collaboration.    Layia Walla R Moira Umholtz, MD 06/29/12 1650 

## 2012-07-05 ENCOUNTER — Encounter: Payer: Self-pay | Admitting: Family

## 2012-07-05 ENCOUNTER — Ambulatory Visit (INDEPENDENT_AMBULATORY_CARE_PROVIDER_SITE_OTHER): Payer: PRIVATE HEALTH INSURANCE | Admitting: Family

## 2012-07-05 VITALS — HR 85 | Temp 98.6°F | Wt 349.0 lb

## 2012-07-05 DIAGNOSIS — S1191XA Laceration without foreign body of unspecified part of neck, initial encounter: Secondary | ICD-10-CM

## 2012-07-05 DIAGNOSIS — Z4802 Encounter for removal of sutures: Secondary | ICD-10-CM

## 2012-07-05 DIAGNOSIS — S1190XA Unspecified open wound of unspecified part of neck, initial encounter: Secondary | ICD-10-CM

## 2012-07-05 NOTE — Progress Notes (Signed)
  Subjective:    Patient ID: Jeremy Guerrero, male    DOB: 28-Oct-1965, 46 y.o.   MRN: [id number]  HPI  Patient is in today for suture removal. He has 3 sutures inserted after he attempted to remove a mold from his right neck.  Review of Systems  Constitutional: Negative.   Skin: Positive for wound.       3 sutures to the right neck.       Objective:   Physical Exam  Cardiovascular: Normal rate, regular rhythm and normal heart sounds.   Pulmonary/Chest: Effort normal and breath sounds normal.  Skin: Skin is warm and dry.       Edges well approximated. Well healed 1 cm wound to the right neck. No s/s of infection. No drainage.           Assessment & Plan:  Assessment: Suture removal  Plan: Well healed. Recheck as needed.

## 2012-07-12 ENCOUNTER — Encounter: Payer: Self-pay | Admitting: Family Medicine

## 2012-07-12 ENCOUNTER — Ambulatory Visit (INDEPENDENT_AMBULATORY_CARE_PROVIDER_SITE_OTHER): Payer: PRIVATE HEALTH INSURANCE | Admitting: Family Medicine

## 2012-07-12 VITALS — BP 130/90 | Temp 98.2°F | Wt 345.0 lb

## 2012-07-12 DIAGNOSIS — M65849 Other synovitis and tenosynovitis, unspecified hand: Secondary | ICD-10-CM

## 2012-07-12 DIAGNOSIS — M778 Other enthesopathies, not elsewhere classified: Secondary | ICD-10-CM

## 2012-07-12 DIAGNOSIS — M65839 Other synovitis and tenosynovitis, unspecified forearm: Secondary | ICD-10-CM

## 2012-07-12 MED ORDER — DICLOFENAC SODIUM 75 MG PO TBEC: 75.0000 mg | DELAYED_RELEASE_TABLET | Freq: Two times a day (BID) | ORAL | Status: DC

## 2012-07-12 NOTE — Progress Notes (Signed)
  Subjective:    Patient ID: Jeremy Guerrero, male    DOB: [birth year], 46 y.o.   MRN: [id number]  HPI Here for 2 days of a sharp pain in the left hand. 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 Using hot Epsom salt soaks and Aleve.   Review of Systems  Constitutional: Negative.   Musculoskeletal: Positive for arthralgias.       Objective:   Physical Exam  Constitutional: He appears well-developed and well-nourished.  Musculoskeletal:       Tender over the left palm at the base of the flexor tendons on the 2nd and 3rd fingers. No swelling or masses. No erythema or warmth. Full ROM          Assessment & Plan:  Tendonitis. Reduce inflammation with Diclofenac. Wear a left hand and wrist brace. Recheck prn

## 2013-01-20 ENCOUNTER — Ambulatory Visit (INDEPENDENT_AMBULATORY_CARE_PROVIDER_SITE_OTHER): Payer: 59 | Admitting: Family Medicine

## 2013-01-20 ENCOUNTER — Encounter: Payer: Self-pay | Admitting: Family Medicine

## 2013-01-20 VITALS — BP 140/80 | HR 96 | Temp 98.9°F | Wt 338.0 lb

## 2013-01-20 DIAGNOSIS — M722 Plantar fascial fibromatosis: Secondary | ICD-10-CM

## 2013-01-20 MED ORDER — DICLOFENAC SODIUM 75 MG PO TBEC: 75.0000 mg | DELAYED_RELEASE_TABLET | Freq: Two times a day (BID) | ORAL | Status: DC

## 2013-01-20 NOTE — Progress Notes (Signed)
  Subjective:    Patient ID: Jeremy Guerrero, male    DOB: [birth year], 47 y.o.   MRN: [id number]  HPI Here for 2 weeks of pain in the bottom of the left foot. No recent trauma. Advil and hot soaks help a little. He is on his feet all day at his job.    Review of Systems  Constitutional: Negative.   Musculoskeletal: Positive for arthralgias.       Objective:   Physical Exam  Constitutional: He appears well-developed and well-nourished.  Limping   Musculoskeletal:  Very tender along the arch of the left foot, no erythema or swelling          Assessment & Plan:  Rest, wear arch supports inside his shoes. Add Diclofenac bid.

## 2013-07-10 ENCOUNTER — Ambulatory Visit (HOSPITAL_COMMUNITY): Payer: 59 | Attending: Family Medicine

## 2013-07-10 ENCOUNTER — Ambulatory Visit (INDEPENDENT_AMBULATORY_CARE_PROVIDER_SITE_OTHER): Payer: 59 | Admitting: Family Medicine

## 2013-07-10 ENCOUNTER — Encounter: Payer: Self-pay | Admitting: Family Medicine

## 2013-07-10 VITALS — BP 130/90 | HR 89 | Temp 98.5°F | Wt 343.0 lb

## 2013-07-10 DIAGNOSIS — F17200 Nicotine dependence, unspecified, uncomplicated: Secondary | ICD-10-CM | POA: Insufficient documentation

## 2013-07-10 DIAGNOSIS — M7989 Other specified soft tissue disorders: Secondary | ICD-10-CM

## 2013-07-10 DIAGNOSIS — E785 Hyperlipidemia, unspecified: Secondary | ICD-10-CM | POA: Insufficient documentation

## 2013-07-10 DIAGNOSIS — M79609 Pain in unspecified limb: Secondary | ICD-10-CM

## 2013-07-10 DIAGNOSIS — R609 Edema, unspecified: Secondary | ICD-10-CM | POA: Insufficient documentation

## 2013-07-10 NOTE — Progress Notes (Signed)
  Subjective:    Patient ID: Jeremy Guerrero, male    DOB: 07/15/1966, 47 y.o.   MRN: [id number]  HPI Here for the sudden onset 5 days ago of pain and swelling in the right lower leg. No recent trauma. No chest pain or SOB.    Review of Systems  Constitutional: Negative.   Respiratory: Negative.   Cardiovascular: Positive for leg swelling. Negative for chest pain and palpitations.       Objective:   Physical Exam  Constitutional: He appears well-developed and well-nourished. No distress.  Cardiovascular: Normal rate, regular rhythm, normal heart sounds and intact distal pulses.   Pulmonary/Chest: Effort normal and breath sounds normal.  Musculoskeletal:  The right calf is swollen and tender. No cords are felt. Jeremy Guerrero is positive           Assessment & Plan:  Possible DVT. Set up a venous doppler ASAP

## 2013-07-11 ENCOUNTER — Telehealth: Payer: Self-pay | Admitting: Family Medicine

## 2013-07-11 NOTE — Telephone Encounter (Signed)
Message copied by Baldemar Friday on Fri Jul 11, 2013 12:00 PM ------      Message from: Gershon Crane A      Created: Fri Jul 11, 2013 10:37 AM      Regarding: FW: prelim         Tell him the scan was negative for DVT. This indicates phlebitis which will resolve over time. Stay off the leg, keep it elevated, wrap it with hot towels or soak in a hot tub, use Ibuprofen. Recheck prn       ----- Message -----         From: Richardean Canal         Sent: 07/10/2013   3:31 PM           To: Nelwyn Salisbury, MD      Subject: prelim                                                   Venous duplex of the right leg appears negative for acute DVT.             Thank you        ------

## 2013-07-11 NOTE — Telephone Encounter (Signed)
I spoke with pt and gave results.  

## 2013-09-18 HISTORY — PX: KNEE SURGERY: SHX244

## 2013-10-28 ENCOUNTER — Ambulatory Visit (INDEPENDENT_AMBULATORY_CARE_PROVIDER_SITE_OTHER): Payer: BC Managed Care – PPO | Admitting: Family Medicine

## 2013-10-28 ENCOUNTER — Encounter: Payer: Self-pay | Admitting: Family Medicine

## 2013-10-28 VITALS — BP 132/88 | HR 93 | Temp 99.2°F | Ht 70.0 in | Wt 349.0 lb

## 2013-10-28 DIAGNOSIS — R6882 Decreased libido: Secondary | ICD-10-CM

## 2013-10-28 DIAGNOSIS — L309 Dermatitis, unspecified: Secondary | ICD-10-CM

## 2013-10-28 DIAGNOSIS — L259 Unspecified contact dermatitis, unspecified cause: Secondary | ICD-10-CM

## 2013-10-28 DIAGNOSIS — G4733 Obstructive sleep apnea (adult) (pediatric): Secondary | ICD-10-CM

## 2013-10-28 DIAGNOSIS — E785 Hyperlipidemia, unspecified: Secondary | ICD-10-CM

## 2013-10-28 LAB — TESTOSTERONE: Testosterone: 374.79 ng/dL (ref 350.00–890.00)

## 2013-10-28 LAB — CBC WITH DIFFERENTIAL/PLATELET
Basophils Absolute: 0 10*3/uL (ref 0.0–0.1)
Basophils Relative: 0.5 % (ref 0.0–3.0)
Eosinophils Absolute: 0.1 10*3/uL (ref 0.0–0.7)
Eosinophils Relative: 1.5 % (ref 0.0–5.0)
HCT: 53.8 % — ABNORMAL HIGH (ref 39.0–52.0)
HEMOGLOBIN: 17.6 g/dL — AB (ref 13.0–17.0)
Lymphocytes Relative: 30.5 % (ref 12.0–46.0)
Lymphs Abs: 2.8 10*3/uL (ref 0.7–4.0)
MCHC: 32.7 g/dL (ref 30.0–36.0)
MCV: 96.3 fl (ref 78.0–100.0)
Monocytes Absolute: 1 10*3/uL (ref 0.1–1.0)
Monocytes Relative: 10.6 % (ref 3.0–12.0)
NEUTROS ABS: 5.2 10*3/uL (ref 1.4–7.7)
NEUTROS PCT: 56.9 % (ref 43.0–77.0)
Platelets: 221 10*3/uL (ref 150.0–400.0)
RBC: 5.59 Mil/uL (ref 4.22–5.81)
RDW: 13.8 % (ref 11.5–14.6)
WBC: 9.2 10*3/uL (ref 4.5–10.5)

## 2013-10-28 LAB — BASIC METABOLIC PANEL
BUN: 10 mg/dL (ref 6–23)
CHLORIDE: 105 meq/L (ref 96–112)
CO2: 28 mEq/L (ref 19–32)
CREATININE: 1 mg/dL (ref 0.4–1.5)
Calcium: 9.4 mg/dL (ref 8.4–10.5)
GFR: 87.7 mL/min (ref >60.00)
GLUCOSE: 97 mg/dL (ref 70–99)
Potassium: 5.4 mEq/L — ABNORMAL HIGH (ref 3.5–5.1)
Sodium: 141 mEq/L (ref 135–145)

## 2013-10-28 LAB — POCT URINALYSIS DIPSTICK
Bilirubin, UA: NEGATIVE
Blood, UA: NEGATIVE
Glucose, UA: NEGATIVE
Ketones, UA: NEGATIVE
Leukocytes, UA: NEGATIVE
NITRITE UA: NEGATIVE
PH UA: 7
Spec Grav, UA: 1.02
UROBILINOGEN UA: 0.2

## 2013-10-28 LAB — HEPATIC FUNCTION PANEL
ALBUMIN: 4.1 g/dL (ref 3.5–5.2)
ALT: 31 U/L (ref 0–53)
AST: 24 U/L (ref 0–37)
Alkaline Phosphatase: 71 U/L (ref 39–117)
Bilirubin, Direct: 0 mg/dL (ref 0.0–0.3)
TOTAL PROTEIN: 7.3 g/dL (ref 6.0–8.3)
Total Bilirubin: 0.9 mg/dL (ref 0.3–1.2)

## 2013-10-28 LAB — LIPID PANEL
Cholesterol: 203 mg/dL — ABNORMAL HIGH (ref 0–200)
HDL: 29 mg/dL — AB (ref >39.00)
Total CHOL/HDL Ratio: 7
Triglycerides: 202 mg/dL — ABNORMAL HIGH (ref 0.0–149.0)
VLDL: 40.4 mg/dL — ABNORMAL HIGH (ref 0.0–40.0)

## 2013-10-28 LAB — LDL CHOLESTEROL, DIRECT: Direct LDL: 143.2 mg/dL

## 2013-10-28 LAB — TSH: TSH: 1.06 u[IU]/mL (ref 0.35–5.50)

## 2013-10-28 MED ORDER — VARENICLINE TARTRATE 0.5 MG X 11 & 1 MG X 42 PO MISC: ORAL | Status: DC

## 2013-10-28 MED ORDER — VARENICLINE TARTRATE 1 MG PO TABS: 1.0000 mg | ORAL_TABLET | Freq: Two times a day (BID) | ORAL | Status: DC

## 2013-10-28 MED ORDER — TRIAMCINOLONE ACETONIDE 0.1 % EX CREA: 1.0000 "application " | TOPICAL_CREAM | Freq: Two times a day (BID) | CUTANEOUS | Status: DC

## 2013-10-28 NOTE — Progress Notes (Signed)
   Subjective:    Patient ID: Jeremy Guerrero, male    DOB: 1966/03/14, 48 y.o.   MRN: [id number]  HPI Here for a number of isssues. First he asks for a rx for Chantix to quit smoking. He used this before successfully. He has had an itchy rash on the legs for one month. He thinks he has sleep panea because he is tired all the time, he snores at night, and his mother recently told him that he stops breathing in his sleep. He asks about erection problems, both with achieving them and keeping them.    Review of Systems  Constitutional: Positive for fatigue.  Respiratory: Negative.   Cardiovascular: Negative.        Objective:   Physical Exam  Constitutional: He is oriented to person, place, and time. He appears well-developed and well-nourished.  Neck: No thyromegaly present.  Cardiovascular: Normal rate, regular rhythm, normal heart sounds and intact distal pulses.   Pulmonary/Chest: Effort normal and breath sounds normal.  Lymphadenopathy:    He has no cervical adenopathy.  Neurological: He is alert and oriented to person, place, and time.          Assessment & Plan:  He probably does have sleep apnea, so we will refer to Pulmonary. Try Triamcinolone cream for the eczema. Get fasting labs today.

## 2013-10-28 NOTE — Progress Notes (Signed)
Pre visit review using our clinic review tool, if applicable. No additional management support is needed unless otherwise documented below in the visit note. 

## 2013-10-29 ENCOUNTER — Telehealth: Payer: Self-pay | Admitting: Family Medicine

## 2013-10-29 NOTE — Telephone Encounter (Signed)
Relevant patient education assigned to patient using Emmi. ° °

## 2013-10-30 ENCOUNTER — Encounter: Payer: Self-pay | Admitting: Pulmonary Disease

## 2013-10-30 ENCOUNTER — Ambulatory Visit (INDEPENDENT_AMBULATORY_CARE_PROVIDER_SITE_OTHER): Payer: BC Managed Care – PPO | Admitting: Pulmonary Disease

## 2013-10-30 VITALS — BP 142/98 | HR 78 | Temp 98.1°F | Ht 70.0 in | Wt 347.2 lb

## 2013-10-30 DIAGNOSIS — G4733 Obstructive sleep apnea (adult) (pediatric): Secondary | ICD-10-CM

## 2013-10-30 NOTE — Progress Notes (Signed)
   Subjective:    Patient ID: Jeremy Guerrero, male    DOB: [birth year], 48 y.o.   MRN: [id number]  HPI The patient is a 48 year old male who I've been asked to see for possible obstructive sleep apnea. He is been noted to have loud snoring, and also witnessed apneas during sleep by his bed partner. He has frequent awakenings at night, and is not rested in the mornings upon arising. He has significant sleep pressure during the day, and can fall asleep at his desk easily. He also will fall asleep watching television in the evening or movies. Finally, he does have sleep pressure with driving more than 2 hours. The patient's weight is up about 10 pounds over the last 2 years, and his Epworth score today is abnormal at 14.   Sleep Questionnaire What time do you typically go to bed?( Between what hours) 9-10pm 9-10pm at 1053 on 10/30/13 by [name], CMA How long does it take you to fall asleep? 20-29mins 20-14mins at 1053 on 10/30/13 by [name], CMA How many times during the night do you wake up? 3 3 at 1053 on 10/30/13 by [name], CMA What time do you get out of bed to start your day? 0530 0530 at 1053 on 10/30/13 by [name], CMA Do you drive or operate heavy machinery in your occupation? No No at 1053 on 10/30/13 by [name], CMA How much has your weight changed (up or down) over the past two years? (In pounds) 10 lb (4.536 kg) 10 lb (4.536 kg) at 1053 on 10/30/13 by [name], CMA Have you ever had a sleep study before? No No at 1053 on 10/30/13 by [name], CMA Do you currently use CPAP? No No at 1053 on 10/30/13 by [name], CMA Do you wear oxygen at any time? No    Review of Systems  Constitutional: Negative for fever and unexpected weight change.  HENT: Negative for congestion, dental problem, ear pain, nosebleeds, postnasal drip, rhinorrhea, sinus pressure, sneezing, sore throat and trouble swallowing.   Eyes: Negative for  redness and itching.  Respiratory: Positive for shortness of breath and wheezing. Negative for cough and chest tightness.   Cardiovascular: Negative for palpitations and leg swelling.  Gastrointestinal: Negative for nausea and vomiting.  Genitourinary: Negative for dysuria.  Musculoskeletal: Negative for joint swelling.  Skin: Negative for rash.  Neurological: Negative for headaches.  Hematological: Does not bruise/bleed easily.  Psychiatric/Behavioral: Negative for dysphoric mood. The patient is nervous/anxious.        Objective:   Physical Exam Constitutional:  Morbidly obese male, no acute distress  HENT:  Nares patent without discharge  Oropharynx without exudate, palate and uvula are edematous and swollen, +elongation.   Eyes:  Perrla, eomi, no scleral icterus  Neck:  No JVD, no TMG  Cardiovascular:  Normal rate, regular rhythm, no rubs or gallops.  No murmurs        Intact distal pulses  Pulmonary :  Normal breath sounds, no stridor or respiratory distress   No rales, rhonchi, or wheezing  Abdominal:  Soft, nondistended, bowel sounds present.  No tenderness noted.   Musculoskeletal:  1+ lower extremity edema noted.  Lymph Nodes:  No cervical lymphadenopathy noted  Skin:  No cyanosis noted  Neurologic:  Alert, appropriate, moves all 4 extremities without obvious deficit.         Assessment & Plan:

## 2013-10-30 NOTE — Patient Instructions (Signed)
Will schedule for home sleep testing, and arrange followup once the results are available.  Work on weight loss 

## 2013-10-30 NOTE — Assessment & Plan Note (Signed)
The patient's history is classic for clinically significant obstructive sleep apnea. It had a long discussion with him about sleep apnea, including its impact to his quality of life and cardiovascular health. At this point, I think he needs a sleep study for diagnosis, and he is an excellent candidate for home sleep testing. The patient is agreeable to this approach.

## 2013-11-14 ENCOUNTER — Telehealth: Payer: Self-pay | Admitting: Pulmonary Disease

## 2013-11-14 NOTE — Telephone Encounter (Signed)
lmom for pt we are alittle behind on HST WE WILL TRY TO CALL HIM NEXT WEEK Joellen Jersey

## 2013-11-14 NOTE — Telephone Encounter (Signed)
PCC's please advise on home sleep study thanks

## 2013-12-01 DIAGNOSIS — G4733 Obstructive sleep apnea (adult) (pediatric): Secondary | ICD-10-CM

## 2013-12-04 ENCOUNTER — Telehealth: Payer: Self-pay | Admitting: Pulmonary Disease

## 2013-12-04 ENCOUNTER — Encounter: Payer: Self-pay | Admitting: Pulmonary Disease

## 2013-12-04 DIAGNOSIS — G4733 Obstructive sleep apnea (adult) (pediatric): Secondary | ICD-10-CM

## 2013-12-04 NOTE — Telephone Encounter (Signed)
Pt needs ov to review sleep study results.  

## 2013-12-05 NOTE — Telephone Encounter (Signed)
Pt scheduled for 945 appt with [place] on 12/08/13

## 2013-12-08 ENCOUNTER — Ambulatory Visit (INDEPENDENT_AMBULATORY_CARE_PROVIDER_SITE_OTHER): Payer: BC Managed Care – PPO | Admitting: Pulmonary Disease

## 2013-12-08 ENCOUNTER — Encounter: Payer: Self-pay | Admitting: Pulmonary Disease

## 2013-12-08 VITALS — BP 122/84 | HR 74 | Temp 98.4°F | Ht 70.0 in | Wt 343.8 lb

## 2013-12-08 DIAGNOSIS — G4733 Obstructive sleep apnea (adult) (pediatric): Secondary | ICD-10-CM

## 2013-12-08 NOTE — Progress Notes (Signed)
   Subjective:    Patient ID: Jeremy Guerrero, male    DOB: [birth year], 48 y.o.   MRN: [id number]  HPI The patient comes in today for followup of his recent home sleep test. He was found to have moderate OSA, with an AHI of 21 events per hour. I have reviewed the study with him in detail, and answered all of his questions.   Review of Systems  Constitutional: Negative for fever and unexpected weight change.  HENT: Negative for congestion, dental problem, ear pain, nosebleeds, postnasal drip, rhinorrhea, sinus pressure, sneezing, sore throat and trouble swallowing.   Eyes: Negative for redness and itching.  Respiratory: Negative for cough, chest tightness, shortness of breath and wheezing.   Cardiovascular: Negative for palpitations and leg swelling.  Gastrointestinal: Negative for nausea and vomiting.  Genitourinary: Negative for dysuria.  Musculoskeletal: Negative for joint swelling.  Skin: Negative for rash.  Neurological: Negative for headaches.  Hematological: Does not bruise/bleed easily.  Psychiatric/Behavioral: Negative for dysphoric mood. The patient is not nervous/anxious.        Objective:   Physical Exam Obese male in no acute distress Nose without purulence or discharge noted Neck without lymphadenopathy or thyromegaly Lower extremities with mild edema, no cyanosis Alert and oriented, moves all 4 extremities.       Assessment & Plan:

## 2013-12-08 NOTE — Patient Instructions (Signed)
Will start on cpap at a moderate pressure level.  Please call if having tolerance issues.  Work on weight loss followup with me again in 8 weeks.  

## 2013-12-08 NOTE — Assessment & Plan Note (Signed)
The patient has been found to have moderate OSA by his recent home sleep testing, and I have recommended aggressive treatment with a trial of CPAP while he is working on weight reduction. The patient is agreeable to this approach. I will set the patient up on cpap at a moderate pressure level to allow for desensitization, and will troubleshoot the device over the next 4-6weeks if needed.  The pt is to call me if having issues with tolerance.  Will then optimize the pressure once patient is able to wear cpap on a consistent basis.

## 2014-02-06 ENCOUNTER — Ambulatory Visit: Payer: BC Managed Care – PPO | Admitting: Pulmonary Disease

## 2014-03-04 ENCOUNTER — Emergency Department (HOSPITAL_COMMUNITY)
Admission: EM | Admit: 2014-03-04 | Discharge: 2014-03-04 | Disposition: A | Discharge status: Home / Self Care | Payer: BC Managed Care – PPO | Source: Home / Self Care

## 2014-03-04 ENCOUNTER — Encounter (HOSPITAL_COMMUNITY): Payer: Self-pay | Admitting: Emergency Medicine

## 2014-03-04 DIAGNOSIS — M76892 Other specified enthesopathies of left lower limb, excluding foot: Secondary | ICD-10-CM

## 2014-03-04 DIAGNOSIS — M6580 Other synovitis and tenosynovitis, unspecified site: Secondary | ICD-10-CM

## 2014-03-04 NOTE — ED Notes (Signed)
States he began to have pain in his left knee and leg last week prior to flight [place] for a week long conference, and pain has only gotten worse. Minimal relief of pain w aleve

## 2014-03-04 NOTE — ED Provider Notes (Signed)
CSN: [id number]     Arrival date & time 03/04/14  1748 History   First MD Initiated Contact with Patient 03/04/14 1907     Chief Complaint  Patient presents with  . Leg Pain   (Consider location/radiation/quality/duration/timing/severity/associated sxs/prior Treatment) HPI Comments: 48 year old severely obese male complaining of left knee pain. The pain began on June 5 20/15 in the morning. Later that day he completed a flight to [place]. The next day or 2 he felt a better. He attended a conference in which he had prolonged sitting during the days following. Is exacerbated the pain in the knee which is located primarily in the popliteal and medial aspect of the knee joint. There is pain with ambulation as well prolonged sitting. Denies calf swelling or pain. No known injury or trauma.   Past Medical History  Diagnosis Date  . Sciatica   . Hyperlipidemia    Past Surgical History  Procedure Laterality Date  . No past surgeries     Family History  Problem Relation Age of Onset  . Healthy Mother   . Diabetes Father   . Healthy Sister   . Healthy Brother   . Diabetes Maternal Grandfather   . Heart attack Maternal Grandfather   . Healthy Sister   . Healthy Sister   . Heart attack Other   . Cancer Other     throat   History  Substance Use Topics  . Smoking status: Former Smoker -- 1.00 packs/day for 30 years    Types: Cigarettes    Quit date: 11/27/2013  . Smokeless tobacco: Never Used  . Alcohol Use: Yes     Comment: 6 beers a month    Review of Systems  Constitutional: Negative.   Respiratory: Negative.   Gastrointestinal: Negative.   Genitourinary: Negative.   Musculoskeletal: Negative for joint swelling.       As per HPI  Skin: Negative.   Neurological: Negative for dizziness, weakness and numbness.    Allergies  Review of patient's allergies indicates no known allergies.  Home Medications   Prior to Admission medications   Medication Sig Start Date End  Date Taking? Authorizing Lakethia Coppess  albuterol (PROVENTIL HFA;VENTOLIN HFA) 108 (90 BASE) MCG/ACT inhaler Inhale 1-2 puffs into the lungs every 6 (six) hours as needed for wheezing. 08/14/11 12/08/13  Peter A. Lauris Poag, MD  Multiple Vitamin (MULTIVITAMIN WITH MINERALS) TABS Take 1 tablet by mouth daily.    Historical Lawana Hartzell, MD  Nutritional Supplements (CHOLESTEROL DEFENSE PO) Take 1 tablet by mouth daily.     Historical Emmarose Klinke, MD  varenicline (CHANTIX CONTINUING MONTH PAK) 1 MG tablet Take 1 tablet (1 mg total) by mouth 2 (two) times daily. 10/28/13   Laurey Morale, MD   BP 149/96  Pulse 77  Temp(Src) 98.4 F (36.9 C) (Oral)  Resp 16  SpO2 97% Physical Exam  Nursing note and vitals reviewed. Constitutional: He is oriented to person, place, and time. He appears well-developed and well-nourished.  HENT:  Head: Normocephalic and atraumatic.  Eyes: EOM are normal.  Neck: Normal range of motion. Neck supple.  Pulmonary/Chest: Effort normal. No respiratory distress.  Musculoskeletal:  Left knee exam reveals no swelling, deformity or discoloration. There is tenderness to bony prominences of the medial aspect of the knee as well as tender cordlike structures/ tendons leading away from the sites insertion. No effusion is appreciated. No bulging for tenderness in the joint spaces. Some tenderness along the medial joint line. This pain and tenderness is located in  the distal hamstring tendons. No laxity. Negative drawer, negative varus and valgus. These maneuvers do cause pain localized over the medial femoral condyle. No evidence of popliteal cyst. No calf tenderness or swelling or discoloration. Distal neurovascular motor sensory is intact. Normal warm.  Neurological: He is alert and oriented to person, place, and time. No cranial nerve deficit.  Skin: Skin is warm and dry.  Psychiatric: He has a normal mood and affect.    ED Course  Procedures (including critical care time) Labs Review Labs  Reviewed - No data to display  Imaging Review No results found.   MDM   1. Tendinitis of left knee      ice Limit weightbearing increase movement such as flexion and extension. Knee immobilizer with weight bearing. Removed for above flexion and extension movements NSAIDs when necessary  If not improving call the above orthopedist for appointment.in the next 4-5 days     Janne Napoleon, NP 03/04/14 1934

## 2014-03-04 NOTE — Discharge Instructions (Signed)
Tendinitis Tendinitis is swelling and inflammation of the tendons. Tendons are band-like tissues that connect muscle to bone. Tendinitis commonly occurs in the:   Shoulders (rotator cuff).  Heels (Achilles tendon).  Elbows (triceps tendon). CAUSES Tendinitis is usually caused by overusing the tendon, muscles, and joints involved. When the tissue surrounding a tendon (synovium) becomes inflamed, it is called tenosynovitis. Tendinitis commonly develops in people whose jobs require repetitive motions. SYMPTOMS  Pain.  Tenderness.  Mild swelling. DIAGNOSIS Tendinitis is usually diagnosed by physical exam. Your health care provider may also order X-rays or other imaging tests. TREATMENT Your health care provider may recommend certain medicines or exercises for your treatment. HOME CARE INSTRUCTIONS   Use a sling or splint for as long as directed by your health care provider until the pain decreases.  Put ice on the injured area.  Put ice in a plastic bag.  Place a towel between your skin and the bag.  Leave the ice on for 15-20 minutes, 3-4 times a day, or as directed by your health care provider.  Avoid using the limb while the tendon is painful. Perform gentle range of motion exercises only as directed by your health care provider. Stop exercises if pain or discomfort increase, unless directed otherwise by your health care provider.  Only take over-the-counter or prescription medicines for pain, discomfort, or fever as directed by your health care provider. SEEK MEDICAL CARE IF:   Your pain and swelling increase.  You develop new, unexplained symptoms, especially increased numbness in the hands. MAKE SURE YOU:   Understand these instructions.  Will watch your condition.  Will get help right away if you are not doing well or get worse. Document Released: 09/01/2000 Document Revised: 09/09/2013 Document Reviewed: 11/21/2010 [facility] Patient Information 2015 [place],  [place]. This information is not intended to replace advice given to you by your health care provider. Make sure you discuss any questions you have with your health care provider.  Popliteus Tendinitis  Tendonitis is a condition that is characterized by inflammation of a tendon. A tendon is the soft tissue that connects muscles to the skeletal system allowing for body movements. Popliteus tendonitis affects the popliteus tendon, which connects the popliteus muscle to the thigh bone (femur) near the knee. The popliteus muscle helps bend and rotate the knee. Popliteus tendonitis is often caused by a tendon tear (strain). Strains are classified into three categories. Grade 1 strains cause pain, but the tendon is not lengthened. Grade 2 strains include a lengthened ligament due to the ligament being stretched or partially ruptured. With grade 2 strains there is still function, although the function may be diminished. Grade 3 strains are characterized by a complete tear of the tendon or muscle, and function is usually impaired. SYMPTOMS   Pain in the knee, specifically the outer (lateral) and back (posterior) portions.  Pain that worsens with use of the popliteus muscle (standing on a slightly bent knee or rotating the knee).  A crackling sound (crepitation) when the tendon is moved or touched (uncommon, except when tested just after exercising). CAUSES Popliteus tendonitis occurs when damage to the popliteus tendon elicits an inflammatory (healing) response. Popliteus tendonitis is often an overuse injury. RISK INCREASES WITH:  Activities that require extensive running or walking downhill.  Poor strength and flexibility.  Failure to warm-up properly before activity.  Flat feet. PREVENTION  Warm up and stretch properly before activity.  Allow for adequate recovery between workouts.  Maintain physical fitness:  Strength, flexibility, and  endurance.  Cardiovascular fitness.  Learn and  implement proper training regimens and sports technique.  Arch supports (orthotics) for individuals with flat feet. PROGNOSIS  If treated properly, then the symptoms of popliteus tendonitis usually resolve within 6 weeks.  RELATED COMPLICATIONS   Prolonged healing time, if improperly treated or re-injured.  Recurrent symptoms that result in a chronic problem. TREATMENT  Treatment initially involves the use of ice and medication to help reduce pain and inflammation. The use of strengthening and stretching exercises may help reduce pain with activity. These exercises may be performed at home or with referral to a therapist. Many individuals find that the use of a compression bandage or a knee sleeve helps reduce symptoms. If you have flat feet, then your caregiver may recommend arch supports. It is important to learn/modify techniques for running uphill/downhill that do not aggravate your symptoms. If symptoms persist for greater than 6 months despite conservative (non-surgical) treatment, then surgery may be recommended to remove the tendon sheath (lining).  MEDICATION   If pain medication is necessary, then nonsteroidal anti-inflammatory medications, such as aspirin and ibuprofen, or other minor pain relievers, such as acetaminophen, are often recommended.  Do not take pain medication within 7 days before surgery.  Prescription pain relievers may be given if deemed necessary by your caregiver. Use only as directed and only as much as you need. HEAT AND COLD  Cold treatment (icing) relieves pain and reduces inflammation. Cold treatment should be applied for 10 to 15 minutes every 2 to 3 hours for inflammation and pain and immediately after any activity that aggravates your symptoms. Use ice packs or massage the area with a piece of ice (ice massage).  Heat treatment may be used prior to performing the stretching and strengthening activities prescribed by your caregiver, physical therapist, or  athletic trainer. Use a heat pack or soak the injury in warm water. SEEK MEDICAL CARE IF:  Treatment seems to offer no benefit, or the condition worsens.  Any medications produce adverse side effects.

## 2014-03-04 NOTE — ED Provider Notes (Signed)
Medical screening examination/treatment/procedure(s) were performed by a resident physician or non-physician practitioner and as the supervising physician I was immediately available for consultation/collaboration.  Lynne Leader, MD    Gregor Hams, MD 03/04/14 2006

## 2014-03-24 ENCOUNTER — Encounter: Payer: Self-pay | Admitting: Family Medicine

## 2014-03-24 ENCOUNTER — Ambulatory Visit (INDEPENDENT_AMBULATORY_CARE_PROVIDER_SITE_OTHER): Payer: BC Managed Care – PPO | Admitting: Family Medicine

## 2014-03-24 VITALS — BP 116/82 | Temp 98.2°F | Ht 70.0 in | Wt 341.0 lb

## 2014-03-24 DIAGNOSIS — M25561 Pain in right knee: Secondary | ICD-10-CM | POA: Insufficient documentation

## 2014-03-24 DIAGNOSIS — M25569 Pain in unspecified knee: Secondary | ICD-10-CM

## 2014-03-24 DIAGNOSIS — M25562 Pain in left knee: Principal | ICD-10-CM

## 2014-03-24 MED ORDER — TRAMADOL HCL 50 MG PO TABS: 100.0000 mg | ORAL_TABLET | Freq: Three times a day (TID) | ORAL | Status: DC | PRN

## 2014-03-24 NOTE — Progress Notes (Signed)
Pre visit review using our clinic review tool, if applicable. No additional management support is needed unless otherwise documented below in the visit note. 

## 2014-03-24 NOTE — Progress Notes (Signed)
   Subjective:    Patient ID: Jeremy Guerrero, male    DOB: 1966-07-25, 48 y.o.   MRN: [id number]  HPI Here for worsening bilateral knee pains. He has had trouble with the left knee for several years but this has gotten worse, and now he has a similar pain in the right knee. He is taking Aleve bid. He has never seen an Orthopedist. He has gained 40 lbs of weight in the past few months since he quit smoking.    Review of Systems  Constitutional: Negative.   Musculoskeletal: Positive for arthralgias.       Objective:   Physical Exam  Constitutional:  Obese, limping a bit   Musculoskeletal:  Both knees show some joint space tenderness, no edema, full ROM          Assessment & Plan:  Probable osteoarthritis. Add Tramadol to use for pain. Refer to Orthopedics.

## 2014-04-16 ENCOUNTER — Encounter (HOSPITAL_COMMUNITY): Payer: Self-pay | Admitting: Emergency Medicine

## 2014-04-16 ENCOUNTER — Emergency Department (HOSPITAL_COMMUNITY)
Admission: EM | Admit: 2014-04-16 | Discharge: 2014-04-16 | Disposition: A | Discharge status: Home / Self Care | Payer: BC Managed Care – PPO | Attending: Emergency Medicine | Admitting: Emergency Medicine

## 2014-04-16 DIAGNOSIS — M25562 Pain in left knee: Secondary | ICD-10-CM

## 2014-04-16 DIAGNOSIS — M25561 Pain in right knee: Secondary | ICD-10-CM

## 2014-04-16 DIAGNOSIS — Z87891 Personal history of nicotine dependence: Secondary | ICD-10-CM | POA: Insufficient documentation

## 2014-04-16 DIAGNOSIS — Z862 Personal history of diseases of the blood and blood-forming organs and certain disorders involving the immune mechanism: Secondary | ICD-10-CM | POA: Insufficient documentation

## 2014-04-16 DIAGNOSIS — Z79899 Other long term (current) drug therapy: Secondary | ICD-10-CM | POA: Insufficient documentation

## 2014-04-16 DIAGNOSIS — G8929 Other chronic pain: Secondary | ICD-10-CM | POA: Insufficient documentation

## 2014-04-16 DIAGNOSIS — M62838 Other muscle spasm: Secondary | ICD-10-CM | POA: Insufficient documentation

## 2014-04-16 DIAGNOSIS — M25569 Pain in unspecified knee: Secondary | ICD-10-CM | POA: Insufficient documentation

## 2014-04-16 DIAGNOSIS — Z8639 Personal history of other endocrine, nutritional and metabolic disease: Secondary | ICD-10-CM | POA: Insufficient documentation

## 2014-04-16 MED ORDER — DIAZEPAM 5 MG PO TABS: 5.0000 mg | ORAL_TABLET | Freq: Four times a day (QID) | ORAL | Status: DC | PRN

## 2014-04-16 NOTE — ED Provider Notes (Signed)
CSN: [id number]     Arrival date & time 04/16/14  1856 History   First MD Initiated Contact with Patient 04/16/14 2244     Chief Complaint  Patient presents with  . Leg Pain     (Consider location/radiation/quality/duration/timing/severity/associated sxs/prior Treatment) HPI 48 year old male with chronic severe bilateral knee pain 24 hours a day for months with left knee pain worse than right knee pain scheduled for surgery next week by orthopedics, patient also has occasional sudden bilateral calf muscle spasms lasting several minutes at a time, all of his symptoms have been present for months, occasionally his left knee will give way but there is been no fall and no recent trauma, he is no color change to his legs no swelling to his legs no weakness or numbness to his legs he has no pain to his hips no pain to his thighs no pain to his ankles or feet and currently has no pain to his calves but does get intermittent bilateral calf pain multiple times per day sudden onset lasting a few minutes with palpable spasms, he takes hydrocodone during the daytime for his knee pain and oxycodone at nighttime for his knee pain, he is now looking for something to help with intermittent muscle spasms. He is able to walk with a limp due to his left knee pain and thinks crutches would help him so that he does not have to bear so much weight on his left knee. Past Medical History  Diagnosis Date  . Sciatica   . Hyperlipidemia    Past Surgical History  Procedure Laterality Date  . No past surgeries     Family History  Problem Relation Age of Onset  . Healthy Mother   . Diabetes Father   . Healthy Sister   . Healthy Brother   . Diabetes Maternal Grandfather   . Heart attack Maternal Grandfather   . Healthy Sister   . Healthy Sister   . Heart attack Other   . Cancer Other     throat   History  Substance Use Topics  . Smoking status: Former Smoker -- 1.00 packs/day for 30 years    Types: Cigarettes     Quit date: 11/27/2013  . Smokeless tobacco: Never Used  . Alcohol Use: Yes     Comment: 6 beers a month    Review of Systems 10 Systems reviewed and are negative for acute change except as noted in the HPI.   Allergies  Review of patient's allergies indicates no known allergies.  Home Medications   Prior to Admission medications   Medication Sig Start Date End Date Taking? Authorizing Provider  albuterol (PROVENTIL HFA;VENTOLIN HFA) 108 (90 BASE) MCG/ACT inhaler Inhale 1-2 puffs into the lungs every 6 (six) hours as needed for wheezing. 08/14/11 03/25/15 Yes Peter A. Lauris Poag, MD  Multiple Vitamin (MULTIVITAMIN WITH MINERALS) TABS Take 1 tablet by mouth daily.   Yes Historical Provider, MD  Nutritional Supplements (CHOLESTEROL DEFENSE PO) Take 1 tablet by mouth daily.    Yes Historical Provider, MD  oxyCODONE-acetaminophen (PERCOCET/ROXICET) 5-325 MG per tablet Take 1-2 tablets by mouth every 6 (six) hours as needed for severe pain.   Yes Historical Provider, MD  traMADol (ULTRAM) 50 MG tablet Take 2 tablets (100 mg total) by mouth every 8 (eight) hours as needed for moderate pain. 03/24/14  Yes Laurey Morale, MD  diazepam (VALIUM) 5 MG tablet Take 1 tablet (5 mg total) by mouth every 6 (six) hours as needed for muscle  spasms (spasms). 04/16/14   Babette Relic, MD   BP 140/84  Pulse 73  Temp(Src) 98.5 F (36.9 C) (Oral)  Resp 16  Ht 5\' 11"  (1.803 m)  Wt 300 lb (136.079 kg)  BMI 41.86 kg/m2  SpO2 97% Physical Exam  Nursing note and vitals reviewed. Constitutional:  Awake, alert, nontoxic appearance.  HENT:  Head: Atraumatic.  Eyes: Right eye exhibits no discharge. Left eye exhibits no discharge.  Neck: Neck supple.  Pulmonary/Chest: Effort normal. He exhibits no tenderness.  Abdominal: Soft. There is no tenderness. There is no rebound.  Musculoskeletal: He exhibits tenderness. He exhibits no edema.  Baseline ROM, no obvious new focal weakness. Bilateral lower Chinese is no  tenderness to his hips or thighs or calves ankles or feet. Bilateral dorsalis pedis pulses intact. Bilateral feet have capillary refill less than 2 seconds normal light touch good movement. Bilateral knees have diffuse tenderness without erythema or increased warmth. Bilateral knees are stable with negative Lachman's testing and no laxity with varus or valgus stress testing but he does have worse pain in both knees with McMurray's testing. There is no asymmetry to the calf circumference bilaterally.  Neurological:  Mental status and motor strength appears baseline for patient and situation.  Skin: No rash noted.  Psychiatric: He has a normal mood and affect.    ED Course  Procedures (including critical care time) Patient / Family / Caregiver informed of clinical course, understand medical decision-making process, and agree with plan. Labs Review Labs Reviewed - No data to display  Imaging Review No results found.   EKG Interpretation None      MDM   Final diagnoses:  Chronic knee pain, left  Chronic knee pain, right  Muscle spasms of both lower extremities    I doubt any other EMC precluding discharge at this time including, but not necessarily limited to the following:SBI, DVT.    Babette Relic, MD 04/17/14 2059

## 2014-04-16 NOTE — Discharge Instructions (Signed)
Return sooner to the emergency department or your doctor immediately if you develop thigh pain, leg swelling, redness to your legs, color change to your legs, weakness or numbness to your legs, chest pain, shortness of breath, or other concerns.

## 2014-04-16 NOTE — ED Notes (Signed)
Patient is alert and oriented x3.  He was given DC instructions and follow up visit instructions.  Patient gave verbal understanding.  He was DC ambulatory under his own power to home.  V/S stable.  He was not showing any signs of distress on DC 

## 2014-04-16 NOTE — ED Notes (Signed)
Pt is c/o bilateral leg pain  Pt has swelling noted in both lower extremities  Pt has known tear in left knee and is scheduled for surgery on 8/5  Pt states the pain is worse and he it is almost unbearable to bear weight on the left  Pt states the pain is more in his calf area than the knee  Pt has been taking oxycodone for the pain but states it is not helping

## 2014-04-16 NOTE — ED Notes (Addendum)
Initial contact- A&Ox4. C/o constant bilateral leg pain x2 weeks. Has torn meniscus on left knee but has constant pain on right and left leg. Took oxycodone at 1800 today. Also has ultram at home for pain. Hx sciatica pain but states "this doesn't feel the same. I'm not able to put weight on it and it's had to walk. I feel like my legs are going to buckle underneath me. " Left leg looks slightly more swollen than right. 1+ pedal and posterior popliteal pulses. Not on blood thinners. No hx blood clots. Hx smoking (30 years)-quit 11/2013. In NAD. Awaiting MD.

## 2014-05-12 ENCOUNTER — Telehealth: Payer: Self-pay | Admitting: Family Medicine

## 2014-05-12 NOTE — Telephone Encounter (Signed)
Pt is requesting an order for labs for STD's and HIV.

## 2014-05-14 NOTE — Telephone Encounter (Signed)
He needs an OV for this  

## 2014-05-14 NOTE — Telephone Encounter (Signed)
Left message for pt to call and schedule appt 

## 2014-05-18 NOTE — Telephone Encounter (Signed)
appt scheduled for pt.  

## 2014-05-19 ENCOUNTER — Ambulatory Visit (INDEPENDENT_AMBULATORY_CARE_PROVIDER_SITE_OTHER): Payer: BC Managed Care – PPO | Admitting: Family Medicine

## 2014-05-19 ENCOUNTER — Encounter: Payer: Self-pay | Admitting: Family Medicine

## 2014-05-19 VITALS — BP 126/78 | Temp 98.4°F | Ht 70.0 in | Wt 346.0 lb

## 2014-05-19 DIAGNOSIS — Z23 Encounter for immunization: Secondary | ICD-10-CM

## 2014-05-19 DIAGNOSIS — Z202 Contact with and (suspected) exposure to infections with a predominantly sexual mode of transmission: Secondary | ICD-10-CM

## 2014-05-19 NOTE — Addendum Note (Signed)
Addended by: Aggie Hacker A on: 05/19/2014 02:01 PM   Modules accepted: Orders

## 2014-05-19 NOTE — Progress Notes (Signed)
   Subjective:    Patient ID: Jeremy Guerrero, male    DOB: 04-05-1966, 49 y.o.   MRN: [id number]  HPI Here asking for advice. He had been in a monogamous relationship for 13 years but they broke up recently. Since then he has been in contact with several individuals and is exploring the possibility of meeting people online. He is worried about STD exposure and wants to be tested. He also asks about any preventive medications he can take for diseases like HIV. One of his recent contacts has been treated for HIV but his viral counts have been undetectable lately. He wonders what his risks are. He feels great and has no symptoms himself.   Review of Systems  Constitutional: Negative.        Objective:   Physical Exam  Constitutional: He appears well-developed and well-nourished.          Assessment & Plan:  We will test for various STDs today. I will refer him to Infectious Disease for more detailed advice

## 2014-05-19 NOTE — Progress Notes (Signed)
Pre visit review using our clinic review tool, if applicable. No additional management support is needed unless otherwise documented below in the visit note. 

## 2014-05-20 LAB — HEPATITIS B SURFACE ANTIBODY,QUALITATIVE: Hep B S Ab: NEGATIVE

## 2014-05-20 LAB — RPR

## 2014-05-20 LAB — HEPATITIS C ANTIBODY: HCV AB: NEGATIVE

## 2014-05-20 LAB — GC/CHLAMYDIA PROBE AMP, URINE
Chlamydia, Swab/Urine, PCR: POSITIVE — AB
GC Probe Amp, Urine: NEGATIVE

## 2014-05-20 LAB — HSV(HERPES SIMPLEX VRS) I + II AB-IGG
HSV 1 GLYCOPROTEIN G AB, IGG: 7.12 IV — AB
HSV 2 Glycoprotein G Ab, IgG: 5.69 IV — ABNORMAL HIGH

## 2014-05-20 LAB — HIV ANTIBODY (ROUTINE TESTING W REFLEX): HIV 1&2 Ab, 4th Generation: NONREACTIVE

## 2014-05-20 LAB — HEPATITIS B SURFACE ANTIGEN: Hepatitis B Surface Ag: NEGATIVE

## 2014-05-22 MED ORDER — DOXYCYCLINE HYCLATE 100 MG PO TABS: 100.0000 mg | ORAL_TABLET | Freq: Two times a day (BID) | ORAL | Status: DC

## 2014-05-22 NOTE — Addendum Note (Signed)
Addended by: Agnes Lawrence on: 05/22/2014 11:45 AM   Modules accepted: Orders

## 2014-06-02 ENCOUNTER — Ambulatory Visit (INDEPENDENT_AMBULATORY_CARE_PROVIDER_SITE_OTHER): Payer: BLUE CROSS/BLUE SHIELD | Admitting: Internal Medicine

## 2014-06-02 ENCOUNTER — Encounter: Payer: Self-pay | Admitting: Internal Medicine

## 2014-06-02 VITALS — BP 150/107 | HR 99 | Temp 98.3°F | Wt 346.0 lb

## 2014-06-02 DIAGNOSIS — Z7251 High risk heterosexual behavior: Secondary | ICD-10-CM | POA: Diagnosis not present

## 2014-06-02 DIAGNOSIS — E785 Hyperlipidemia, unspecified: Secondary | ICD-10-CM | POA: Diagnosis not present

## 2014-06-02 LAB — COMPLETE METABOLIC PANEL WITH GFR
ALK PHOS: 72 U/L (ref 39–117)
ALT: 25 U/L (ref 0–53)
AST: 25 U/L (ref 0–37)
Albumin: 4.3 g/dL (ref 3.5–5.2)
BUN: 13 mg/dL (ref 6–23)
CO2: 32 meq/L (ref 19–32)
CREATININE: 0.91 mg/dL (ref 0.50–1.35)
Calcium: 9.8 mg/dL (ref 8.4–10.5)
Chloride: 100 mEq/L (ref 96–112)
Glucose, Bld: 83 mg/dL (ref 70–99)
Potassium: 5 mEq/L (ref 3.5–5.3)
Sodium: 139 mEq/L (ref 135–145)
Total Bilirubin: 0.4 mg/dL (ref 0.2–1.2)
Total Protein: 6.8 g/dL (ref 6.0–8.3)

## 2014-06-02 MED ORDER — EMTRICITABINE-TENOFOVIR DF 200-300 MG PO TABS: 1.0000 | ORAL_TABLET | Freq: Every day | ORAL | Status: DC

## 2014-06-02 MED ORDER — ATORVASTATIN CALCIUM 20 MG PO TABS: 20.0000 mg | ORAL_TABLET | Freq: Every day | ORAL | Status: DC

## 2014-06-02 NOTE — Progress Notes (Signed)
Subjective:    Patient ID: Jeremy Guerrero, male    DOB: 1965-10-16, 48 y.o.   MRN: [id number]  HPI  48yo M who has sex with men, recently finished having a long term relationship. He is contemplating getting involved with other partners. Concern for risk of HIV. He is interested in starting prophylaxis. He was recently treated for chlamydia. hiv tests at that time were negative.  No Known Allergies Current Outpatient Prescriptions on File Prior to Visit  Medication Sig Dispense Refill  . albuterol (PROVENTIL HFA;VENTOLIN HFA) 108 (90 BASE) MCG/ACT inhaler Inhale 1-2 puffs into the lungs every 6 (six) hours as needed for wheezing.  1 Inhaler  0  . diazepam (VALIUM) 5 MG tablet Take 1 tablet (5 mg total) by mouth every 6 (six) hours as needed for muscle spasms (spasms).  15 tablet  0  . doxycycline (VIBRA-TABS) 100 MG tablet Take 1 tablet (100 mg total) by mouth 2 (two) times daily.  20 tablet  0  . Multiple Vitamin (MULTIVITAMIN WITH MINERALS) TABS Take 1 tablet by mouth daily.      . Nutritional Supplements (CHOLESTEROL DEFENSE PO) Take 1 tablet by mouth daily.       Marland Kitchen oxyCODONE-acetaminophen (PERCOCET/ROXICET) 5-325 MG per tablet Take 1-2 tablets by mouth every 6 (six) hours as needed for severe pain.      . traMADol (ULTRAM) 50 MG tablet Take 2 tablets (100 mg total) by mouth every 8 (eight) hours as needed for moderate pain.  60 tablet  2   No current facility-administered medications on file prior to visit.   Active Ambulatory Problems    Diagnosis Date Noted  . HYPERLIPIDEMIA, MIXED 05/03/2007  . HYPERLIPIDEMIA 06/14/2010  . NICOTINE ADDICTION 04/26/2007  . URI 05/21/2008  . SCIATICA, RIGHT 07/23/2007  . BACK PAIN 06/22/2010  . FATIGUE 04/26/2007  . ABDOMINAL PAIN, LOWER 02/28/2008  . ABDOMINAL MASS 03/03/2008  . LIBIDO, DECREASED 04/26/2007  . Eczema 10/28/2013  . OSA (obstructive sleep apnea) 10/30/2013  . Knee pain, bilateral 03/24/2014   Resolved Ambulatory Problems      Diagnosis Date Noted  . No Resolved Ambulatory Problems   Past Medical History  Diagnosis Date  . Hyperlipidemia    History  Substance Use Topics  . Smoking status: Former Smoker -- 1.00 packs/day for 30 years    Types: Cigarettes    Quit date: 11/27/2013  . Smokeless tobacco: Never Used  . Alcohol Use: Yes     Comment: 6 beers a month  family history includes Cancer in his other; Diabetes in his father and maternal grandfather; Healthy in his brother, mother, sister, sister, and sister; Heart attack in his maternal grandfather and other.  Review of Systems Review of Systems  Constitutional: Negative for fever, chills, diaphoresis, activity change, appetite change, fatigue and unexpected weight change.  HENT: Negative for congestion, sore throat, rhinorrhea, sneezing, trouble swallowing and sinus pressure.  Eyes: Negative for photophobia and visual disturbance.  Respiratory: Negative for cough, chest tightness, shortness of breath, wheezing and stridor.  Cardiovascular: Negative for chest pain, palpitations and leg swelling.  Gastrointestinal: Negative for nausea, vomiting, abdominal pain, diarrhea, constipation, blood in stool, abdominal distention and anal bleeding.  Genitourinary: Negative for dysuria, hematuria, flank pain and difficulty urinating.  Musculoskeletal: Negative for myalgias, back pain, joint swelling, arthralgias and gait problem.  Skin: Negative for color change, pallor, rash and wound.  Neurological: Negative for dizziness, tremors, weakness and light-headedness.  Hematological: Negative for adenopathy. Does not bruise/bleed easily.  Psychiatric/Behavioral: Negative for behavioral problems, confusion, sleep disturbance, dysphoric mood, decreased concentration and agitation.       Objective:   Physical Exam  BP 150/107  Pulse 99  Temp(Src) 98.3 F (36.8 C) (Oral)  Wt 346 lb (156.945 kg) Physical Exam  Constitutional: He is oriented to person, place,  and time. He appears well-developed and well-nourished. No distress.  HENT:  Mouth/Throat: Oropharynx is clear and moist. No oropharyngeal exudate.  Cardiovascular: Normal rate, regular rhythm and normal heart sounds. Exam reveals no gallop and no friction rub.  No murmur heard.  Pulmonary/Chest: Effort normal and breath sounds normal. No respiratory distress. He has no wheezes.  Abdominal: Soft. Bowel sounds are normal. He exhibits no distension. There is no tenderness.  Lymphadenopathy:  He has no cervical adenopathy.  Neurological: He is alert and oriented to person, place, and time.  Skin: Skin is warm and dry. No rash noted. No erythema.  Psychiatric: He has a normal mood and affect. His behavior is normal.         Assessment & Plan:  To start PrEP with truvada Will check cmp Will recheck hiv ab receommned to get hep b and hep a vaccine Will start on lipitor and stop otc med  rtc in 4 wk

## 2014-06-15 ENCOUNTER — Encounter: Payer: Self-pay | Admitting: Family Medicine

## 2014-06-15 ENCOUNTER — Ambulatory Visit (INDEPENDENT_AMBULATORY_CARE_PROVIDER_SITE_OTHER): Payer: BC Managed Care – PPO | Admitting: Family Medicine

## 2014-06-15 VITALS — Temp 98.4°F | Ht 70.0 in | Wt 349.0 lb

## 2014-06-15 DIAGNOSIS — L509 Urticaria, unspecified: Secondary | ICD-10-CM

## 2014-06-15 NOTE — Progress Notes (Signed)
Pre visit review using our clinic review tool, if applicable. No additional management support is needed unless otherwise documented below in the visit note. 

## 2014-06-15 NOTE — Progress Notes (Signed)
   Subjective:    Patient ID: Jeremy Guerrero, male    DOB: Feb 28, 1966, 48 y.o.   MRN: [id number]  HPI Here for 5 days of diffuse itching. No rash had been visible until last night when a small red spot appeared on the LLQ of the abdomen. He denies any new soaps or detergents, but he was started on a new medication 13 days ago. He has seen me with concerns about possible exposure to HIV and we did testing that was negative. He was referred to Infectious Disease and they started him on Truvada, which he started that day.    Review of Systems  Constitutional: Negative.   Respiratory: Negative.   Cardiovascular: Negative.   Skin: Positive for rash.       Objective:   Physical Exam  Constitutional: He appears well-developed and well-nourished. No distress.  Skin:  The LLQ has a small red wheal           Assessment & Plan:  He is having an allergic reaction, most likely to Truvada. He will stop this for now and use Benadryl prn. He will contact Dr. Baxter Flattery tomorrow to discuss this

## 2014-06-17 ENCOUNTER — Telehealth: Payer: Self-pay | Admitting: Family Medicine

## 2014-06-17 NOTE — Telephone Encounter (Signed)
Brandy with [facility] called to verify follow up and treatment for STD testing. Advised her to contact Dr Baxter Flattery at [facility] for further information.

## 2014-06-18 NOTE — Telephone Encounter (Signed)
Do I need to do anything with this note?

## 2014-06-19 ENCOUNTER — Telehealth: Payer: Self-pay | Admitting: Internal Medicine

## 2014-06-19 DIAGNOSIS — Z7251 High risk heterosexual behavior: Secondary | ICD-10-CM

## 2014-06-19 NOTE — Telephone Encounter (Signed)
Left message to stop taking truvada based upon recent visit with pcp starting to develop rash concerning for tenofovir rash. Secondly, I am not sure if I had acquired the correct information on his last unprotected sexual encounter and his HIV testing. Ideally would like him to retest for HIV in addition to doing HIV viral load before re-initiation truvada. Will sort out sexual history and current clinical presentation of rash

## 2014-06-30 ENCOUNTER — Encounter: Payer: Self-pay | Admitting: Internal Medicine

## 2014-06-30 ENCOUNTER — Ambulatory Visit (INDEPENDENT_AMBULATORY_CARE_PROVIDER_SITE_OTHER): Payer: BLUE CROSS/BLUE SHIELD | Admitting: Internal Medicine

## 2014-06-30 VITALS — BP 150/100 | HR 94 | Temp 98.8°F | Ht 71.0 in | Wt 354.0 lb

## 2014-06-30 DIAGNOSIS — Z7251 High risk heterosexual behavior: Secondary | ICD-10-CM

## 2014-06-30 NOTE — Progress Notes (Signed)
Subjective:    Patient ID: Jeremy Guerrero, male    DOB: 04-03-1966, 48 y.o.   MRN: [id number]  HPI Jeremy Guerrero is a 48yo M who was seen on 10/2 for evaluation of starting PrEP to prevent acquisition of HIV disease. He had been tested for HIV and STI roughly 1 month prior to that visit on 9/1 since he had been with several partners, one of which HIV+ with good viral suppression. He was found to have chlamydia and treated for STI. He has been on truvada/RLG in the past for post exposure prophylaxis and tolerated the regimen without difficulty. He states that he started taking truvada and after a week he started to notice a pruritic rash develop on legs ,arms, and abdomen. He thought it may be due to scabies from work. When he was seen by PCP who noticed irritation around abdomen mostly. He was instructed to stop all meds including truvada. Even after he stopped meds, he still noticed that he was itching. He then remembered that he used a new soap which may have contributed to itchy rash. He has replaced all soaps, takes benadryl plus hydrocodone lotion to address his rash which has now resolved.  He also mentioned that his insurance plus co pay card did not cover medication since his truvada out of pocket cost was $1500  Last time he had unprotected sex: 20-30 days.   Current Outpatient Prescriptions on File Prior to Visit  Medication Sig Dispense Refill  . albuterol (PROVENTIL HFA;VENTOLIN HFA) 108 (90 BASE) MCG/ACT inhaler Inhale 1-2 puffs into the lungs every 6 (six) hours as needed for wheezing.  1 Inhaler  0  . atorvastatin (LIPITOR) 20 MG tablet Take 1 tablet (20 mg total) by mouth daily.  30 tablet  11  . diazepam (VALIUM) 5 MG tablet Take 1 tablet (5 mg total) by mouth every 6 (six) hours as needed for muscle spasms (spasms).  15 tablet  0  . doxycycline (VIBRA-TABS) 100 MG tablet Take 1 tablet (100 mg total) by mouth 2 (two) times daily.  20 tablet  0  . emtricitabine-tenofovir (TRUVADA)  200-300 MG per tablet Take 1 tablet by mouth daily.  30 tablet  11  . Multiple Vitamin (MULTIVITAMIN WITH MINERALS) TABS Take 1 tablet by mouth daily.      . Nutritional Supplements (CHOLESTEROL DEFENSE PO) Take 1 tablet by mouth daily.       Marland Kitchen oxyCODONE-acetaminophen (PERCOCET/ROXICET) 5-325 MG per tablet Take 1-2 tablets by mouth every 6 (six) hours as needed for severe pain.      . traMADol (ULTRAM) 50 MG tablet Take 2 tablets (100 mg total) by mouth every 8 (eight) hours as needed for moderate pain.  60 tablet  2   No current facility-administered medications on file prior to visit.   Active Ambulatory Problems    Diagnosis Date Noted  . HYPERLIPIDEMIA, MIXED 05/03/2007  . HYPERLIPIDEMIA 06/14/2010  . NICOTINE ADDICTION 04/26/2007  . URI 05/21/2008  . SCIATICA, RIGHT 07/23/2007  . BACK PAIN 06/22/2010  . FATIGUE 04/26/2007  . ABDOMINAL PAIN, LOWER 02/28/2008  . ABDOMINAL MASS 03/03/2008  . LIBIDO, DECREASED 04/26/2007  . Eczema 10/28/2013  . OSA (obstructive sleep apnea) 10/30/2013  . Knee pain, bilateral 03/24/2014   Resolved Ambulatory Problems    Diagnosis Date Noted  . No Resolved Ambulatory Problems   Past Medical History  Diagnosis Date  . Hyperlipidemia      Review of Systems     Objective:   Physical  Exam BP 150/100  Pulse 94  Temp(Src) 98.8 F (37.1 C) (Oral)  Ht 5\' 11"  (1.803 m)  Wt 354 lb (160.573 kg)  BMI 49.39 kg/m2 Physical Exam  Constitutional: He is oriented to person, place, and time. He appears well-developed and well-nourished. No distress.  HENT:  Mouth/Throat: Oropharynx is clear and moist. No oropharyngeal exudate.  Cardiovascular: Normal rate, regular rhythm and normal heart sounds. Exam reveals no gallop and no friction rub.  No murmur heard.  Pulmonary/Chest: Effort normal and breath sounds normal. No respiratory distress. He has no wheezes.  Abdominal: Soft. Bowel sounds are normal. He exhibits no distension. There is no tenderness.    Lymphadenopathy:  He has no cervical adenopathy.  Neurological: He is alert and oriented to person, place, and time.  Skin: Skin is warm and dry. No rash noted. No erythema.  Psychiatric: He has a normal mood and affect. His behavior is normal.       Assessment & Plan:   PReP = we will wait on restarting truvada until we can get Patient assistance for PREP. Check HIV viral load, cbc.  Use protection for now.

## 2014-07-01 LAB — HIV-1 RNA ULTRAQUANT REFLEX TO GENTYP+: HIV-1 RNA Quant, Log: <1.3 {Log} (ref <1.30)

## 2014-07-01 LAB — HIV ANTIBODY (ROUTINE TESTING W REFLEX): HIV 1&2 Ab, 4th Generation: NONREACTIVE

## 2014-07-15 ENCOUNTER — Other Ambulatory Visit: Payer: Self-pay | Admitting: *Deleted

## 2014-07-15 DIAGNOSIS — Z7251 High risk heterosexual behavior: Secondary | ICD-10-CM

## 2014-07-15 MED ORDER — EMTRICITABINE-TENOFOVIR DF 200-300 MG PO TABS: 1.0000 | ORAL_TABLET | Freq: Every day | ORAL | Status: DC

## 2014-07-20 ENCOUNTER — Ambulatory Visit (INDEPENDENT_AMBULATORY_CARE_PROVIDER_SITE_OTHER): Payer: BC Managed Care – PPO | Admitting: Internal Medicine

## 2014-07-20 ENCOUNTER — Encounter: Payer: Self-pay | Admitting: Internal Medicine

## 2014-07-20 VITALS — BP 155/90 | HR 91 | Temp 97.2°F | Wt 362.0 lb

## 2014-07-20 DIAGNOSIS — Z7251 High risk heterosexual behavior: Secondary | ICD-10-CM | POA: Diagnosis not present

## 2014-07-21 NOTE — Progress Notes (Signed)
Cc: follow up on rash, Subjective:    Patient ID: Jeremy Guerrero, male    DOB: Dec 05, 1965, 48 y.o.   MRN: [id number]  HPI 48yo M who has MSM. Previously seen for PrEP. He was last seen 2 wk ago after having new onset rash, thought to be related to environmental exposure from work rather than [place] which he had just started. We are also trying to get patient assistance with high co pay cost. He is doing well. Feeling like he is back to his baseline.  Not on File   Current Outpatient Prescriptions on File Prior to Visit  Medication Sig Dispense Refill  . albuterol (PROVENTIL HFA;VENTOLIN HFA) 108 (90 BASE) MCG/ACT inhaler Inhale 1-2 puffs into the lungs every 6 (six) hours as needed for wheezing. 1 Inhaler 0  . atorvastatin (LIPITOR) 20 MG tablet Take 1 tablet (20 mg total) by mouth daily. 30 tablet 11  . diazepam (VALIUM) 5 MG tablet Take 1 tablet (5 mg total) by mouth every 6 (six) hours as needed for muscle spasms (spasms). 15 tablet 0  . doxycycline (VIBRA-TABS) 100 MG tablet Take 1 tablet (100 mg total) by mouth 2 (two) times daily. 20 tablet 0  . emtricitabine-tenofovir (TRUVADA) 200-300 MG per tablet Take 1 tablet by mouth daily. 30 tablet 2  . Multiple Vitamin (MULTIVITAMIN WITH MINERALS) TABS Take 1 tablet by mouth daily.    . Nutritional Supplements (CHOLESTEROL DEFENSE PO) Take 1 tablet by mouth daily.     Marland Kitchen oxyCODONE-acetaminophen (PERCOCET/ROXICET) 5-325 MG per tablet Take 1-2 tablets by mouth every 6 (six) hours as needed for severe pain.    . traMADol (ULTRAM) 50 MG tablet Take 2 tablets (100 mg total) by mouth every 8 (eight) hours as needed for moderate pain. 60 tablet 2   No current facility-administered medications on file prior to visit.   Active Ambulatory Problems    Diagnosis Date Noted  . HYPERLIPIDEMIA, MIXED 05/03/2007  . HYPERLIPIDEMIA 06/14/2010  . NICOTINE ADDICTION 04/26/2007  . URI 05/21/2008  . SCIATICA, RIGHT 07/23/2007  . BACK PAIN 06/22/2010  .  FATIGUE 04/26/2007  . ABDOMINAL PAIN, LOWER 02/28/2008  . ABDOMINAL MASS 03/03/2008  . LIBIDO, DECREASED 04/26/2007  . Eczema 10/28/2013  . OSA (obstructive sleep apnea) 10/30/2013  . Knee pain, bilateral 03/24/2014   Resolved Ambulatory Problems    Diagnosis Date Noted  . No Resolved Ambulatory Problems   Past Medical History  Diagnosis Date  . Hyperlipidemia       Review of Systems Review of Systems  Constitutional: Negative for fever, chills, diaphoresis, activity change, appetite change, fatigue and unexpected weight change.  HENT: Negative for congestion, sore throat, rhinorrhea, sneezing, trouble swallowing and sinus pressure.  Eyes: Negative for photophobia and visual disturbance.  Respiratory: Negative for cough, chest tightness, shortness of breath, wheezing and stridor.  Cardiovascular: Negative for chest pain, palpitations and leg swelling.  Gastrointestinal: Negative for nausea, vomiting, abdominal pain, diarrhea, constipation, blood in stool, abdominal distention and anal bleeding.  Genitourinary: Negative for dysuria, hematuria, flank pain and difficulty urinating.  Musculoskeletal: Negative for myalgias, back pain, joint swelling, arthralgias and gait problem.  Skin: Negative for color change, pallor, rash and wound.  Neurological: Negative for dizziness, tremors, weakness and light-headedness.  Hematological: Negative for adenopathy. Does not bruise/bleed easily.  Psychiatric/Behavioral: Negative for behavioral problems, confusion, sleep disturbance, dysphoric mood, decreased concentration and agitation.       Objective:   Physical Exam BP 155/90 mmHg  Pulse 91  Temp(Src) 97.2  F (36.2 C) (Oral)  Wt 362 lb (164.202 kg) Physical Exam  Constitutional: He is oriented to person, place, and time. He appears well-developed and well-nourished. No distress.  Neurological: He is alert and oriented to person, place, and time.  Skin: Skin is warm and dry. No rash  noted. No erythema.  Psychiatric: He has a normal mood and affect. His behavior is normal.       Assessment & Plan:  PrEP = awaiting to get patient assistance for truvada. Asked him to remain using condoms. Start truvada once he gets access. We will have him come back to clinic in 4-6 wk while on truvada to check CMP. If he does recurrent rash, he will need to call back for assessment.

## 2014-11-08 ENCOUNTER — Other Ambulatory Visit: Payer: Self-pay | Admitting: Internal Medicine

## 2014-11-14 ENCOUNTER — Other Ambulatory Visit: Payer: Self-pay | Admitting: Internal Medicine

## 2014-11-19 ENCOUNTER — Other Ambulatory Visit: Payer: Self-pay | Admitting: Licensed Clinical Social Worker

## 2014-11-19 DIAGNOSIS — Z7251 High risk heterosexual behavior: Secondary | ICD-10-CM

## 2014-11-19 MED ORDER — EMTRICITABINE-TENOFOVIR DF 200-300 MG PO TABS
1.0000 | ORAL_TABLET | Freq: Every day | ORAL | Status: DC
Start: 2014-11-19 — End: 2015-01-25

## 2014-12-28 ENCOUNTER — Emergency Department (INDEPENDENT_AMBULATORY_CARE_PROVIDER_SITE_OTHER): Payer: BLUE CROSS/BLUE SHIELD

## 2014-12-28 ENCOUNTER — Encounter (HOSPITAL_COMMUNITY): Payer: Self-pay

## 2014-12-28 ENCOUNTER — Emergency Department (INDEPENDENT_AMBULATORY_CARE_PROVIDER_SITE_OTHER)
Admission: EM | Admit: 2014-12-28 | Discharge: 2014-12-28 | Disposition: A | Discharge status: Home / Self Care | Payer: BLUE CROSS/BLUE SHIELD | Source: Home / Self Care | Attending: Emergency Medicine | Admitting: Emergency Medicine

## 2014-12-28 DIAGNOSIS — S93402A Sprain of unspecified ligament of left ankle, initial encounter: Secondary | ICD-10-CM | POA: Diagnosis not present

## 2014-12-28 NOTE — Discharge Instructions (Signed)

## 2014-12-28 NOTE — ED Notes (Addendum)
C/o pain , swelling after he stepped wrong yesterday, and twisted left ankle. C/o minimal relief w 400 mg motrin

## 2014-12-28 NOTE — ED Provider Notes (Signed)
CSN: [id number]     Arrival date & time 12/28/14  1350 History   First MD Initiated Contact with Patient 12/28/14 1624     Chief Complaint  Patient presents with  . Ankle Pain   (Consider location/radiation/quality/duration/timing/severity/associated sxs/prior Treatment) HPI Comments: 49 year old morbidly obese male states he was descending stairs and he accidentally twisted his left ankle yesterday. He had some swelling in pain yesterday but he treated it with ice and elevation and it improved. 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   Past Medical History  Diagnosis Date  . Sciatica   . Hyperlipidemia   . HIV disease    Past Surgical History  Procedure Laterality Date  . No past surgeries     Family History  Problem Relation Age of Onset  . Healthy Mother   . Diabetes Father   . Healthy Sister   . Healthy Brother   . Diabetes Maternal Grandfather   . Heart attack Maternal Grandfather   . Healthy Sister   . Healthy Sister   . Heart attack Other   . Cancer Other     throat   History  Substance Use Topics  . Smoking status: Former Smoker -- 1.00 packs/day for 30 years    Types: Cigarettes    Quit date: 11/27/2013  . Smokeless tobacco: Never Used  . Alcohol Use: Yes     Comment: 6 beers a month    Review of Systems  Constitutional: Positive for activity change. Negative for fever.  Respiratory: Negative.   Gastrointestinal: Negative.   Genitourinary: Negative.   Musculoskeletal:       As per history of present illness  Skin: Negative.   Neurological: Negative.     Allergies  Review of patient's allergies indicates not on file.  Home Medications   Prior to Admission medications   Medication Sig Start Date End Date Taking? Authorizing Provider  albuterol (PROVENTIL HFA;VENTOLIN HFA) 108 (90 BASE) MCG/ACT inhaler Inhale 1-2 puffs into the lungs every 6 (six) hours as needed for wheezing. 08/14/11 03/25/15  Peter A.  Lauris Poag, MD  atorvastatin (LIPITOR) 20 MG tablet Take 1 tablet (20 mg total) by mouth daily. 06/02/14   Carlyle Basques, MD  diazepam (VALIUM) 5 MG tablet Take 1 tablet (5 mg total) by mouth every 6 (six) hours as needed for muscle spasms (spasms). 04/16/14   Riki Altes, MD  emtricitabine-tenofovir (TRUVADA) 200-300 MG per tablet Take 1 tablet by mouth daily. 11/19/14   Carlyle Basques, MD  Multiple Vitamin (MULTIVITAMIN WITH MINERALS) TABS Take 1 tablet by mouth daily.    Historical Provider, MD  Nutritional Supplements (CHOLESTEROL DEFENSE PO) Take 1 tablet by mouth daily.     Historical Provider, MD   BP 127/83 mmHg  Pulse 80  Temp(Src) 98 F (36.7 C) (Oral)  Resp 18  SpO2 94% Physical Exam  Constitutional: He is oriented to person, place, and time. He appears well-developed and well-nourished. No distress.  Neck: Normal range of motion. Neck supple.  Pulmonary/Chest: Effort normal. No respiratory distress.  Musculoskeletal:  Left ankle with swelling and tenderness primarily to the lateral aspect. No deformity. Plantar flexion and dorsiflexion mostly intact. Distal neurovascular motor sensory is intact. Pedal pulses 2+.  Neurological: He is alert and oriented to person, place, and time.  Skin: Skin is warm and dry.  Psychiatric: He has a normal mood and affect.  Nursing note and vitals reviewed.   ED Course  Procedures (including  critical care time) Labs Review Labs Reviewed - No data to display  Imaging Review Dg Ankle Complete Left  12/28/2014   CLINICAL DATA:  Pain and swelling  EXAM: LEFT ANKLE COMPLETE - 3+ VIEW  COMPARISON:  None.  FINDINGS: Frontal, oblique, and lateral views were obtained. There is generalized soft tissue swelling. There is no demonstrable fracture or effusion. Ankle mortise appears intact. There are small spurs arising from the posterior and inferior calcaneus.  IMPRESSION: Soft tissue swelling. No demonstrable fracture. Mortise intact. Small calcaneal spurs  present.   Electronically Signed   By: Lowella Grip III M.D.   On: 12/28/2014 16:02     MDM   1. Ankle sprain, left, initial encounter    RICE ASO Stay off [facility]  Janne Napoleon, NP 12/28/14 1637

## 2015-01-25 ENCOUNTER — Telehealth: Payer: Self-pay | Admitting: *Deleted

## 2015-01-25 DIAGNOSIS — Z7251 High risk heterosexual behavior: Secondary | ICD-10-CM

## 2015-01-25 MED ORDER — EMTRICITABINE-TENOFOVIR DF 200-300 MG PO TABS
1.0000 | ORAL_TABLET | Freq: Every day | ORAL | Status: DC
Start: 2015-01-25 — End: 2015-04-19

## 2015-01-25 NOTE — Telephone Encounter (Signed)
Pt needing f/u appt w/ Dr. Baxter Flattery.  Requested pt call [facility] for appt.  Number given.

## 2015-01-25 NOTE — Telephone Encounter (Signed)
Pt need to make a f/u appt with Dr. Baxter Flattery.  Last appt 07/2014.

## 2015-02-27 ENCOUNTER — Other Ambulatory Visit: Payer: Self-pay | Admitting: Internal Medicine

## 2015-03-24 ENCOUNTER — Other Ambulatory Visit: Payer: BLUE CROSS/BLUE SHIELD

## 2015-03-24 DIAGNOSIS — Z7251 High risk heterosexual behavior: Secondary | ICD-10-CM

## 2015-03-25 LAB — HIV-1 RNA QUANT-NO REFLEX-BLD
HIV 1 RNA Quant: <20 copies/mL (ref <20)
HIV-1 RNA Quant, Log: <1.3 {Log} (ref <1.30)

## 2015-03-25 LAB — RPR

## 2015-03-26 LAB — URINE CYTOLOGY ANCILLARY ONLY
Chlamydia: NEGATIVE
Neisseria Gonorrhea: NEGATIVE

## 2015-04-12 ENCOUNTER — Other Ambulatory Visit: Payer: Self-pay | Admitting: Internal Medicine

## 2015-04-15 ENCOUNTER — Ambulatory Visit (INDEPENDENT_AMBULATORY_CARE_PROVIDER_SITE_OTHER): Payer: BLUE CROSS/BLUE SHIELD | Admitting: Internal Medicine

## 2015-04-15 ENCOUNTER — Encounter: Payer: Self-pay | Admitting: Internal Medicine

## 2015-04-15 VITALS — BP 148/94 | HR 80 | Temp 98.7°F | Wt 364.0 lb

## 2015-04-15 DIAGNOSIS — R030 Elevated blood-pressure reading, without diagnosis of hypertension: Secondary | ICD-10-CM

## 2015-04-15 DIAGNOSIS — Z7251 High risk heterosexual behavior: Secondary | ICD-10-CM

## 2015-04-15 DIAGNOSIS — Z79899 Other long term (current) drug therapy: Secondary | ICD-10-CM | POA: Diagnosis not present

## 2015-04-15 LAB — BASIC METABOLIC PANEL WITH GFR
BUN: 13 mg/dL (ref 7–25)
CO2: 29 meq/L (ref 20–31)
CREATININE: 0.86 mg/dL (ref 0.60–1.35)
Calcium: 9.4 mg/dL (ref 8.6–10.3)
Chloride: 102 mEq/L (ref 98–110)
GFR, Est Non African American: >89 mL/min (ref >=60)
Glucose, Bld: 86 mg/dL (ref 65–99)
Potassium: 5.4 mEq/L — ABNORMAL HIGH (ref 3.5–5.3)
Sodium: 141 mEq/L (ref 135–146)

## 2015-04-15 NOTE — Progress Notes (Signed)
   Subjective:    Patient ID: Jeremy Guerrero, male    DOB: 09/06/1966, 49 y.o.   MRN: [id number]  HPI 49yo M who is taking truvada to prevent risk for HIV acquisition. He reports not missing doses. He is using condoms with new partners but also has main partners where he is also on [place]. He reports doing well otherwise.   Current Outpatient Prescriptions on File Prior to Visit  Medication Sig Dispense Refill  . atorvastatin (LIPITOR) 20 MG tablet Take 1 tablet (20 mg total) by mouth daily. 30 tablet 11  . diazepam (VALIUM) 5 MG tablet Take 1 tablet (5 mg total) by mouth every 6 (six) hours as needed for muscle spasms (spasms). 15 tablet 0  . Multiple Vitamin (MULTIVITAMIN WITH MINERALS) TABS Take 1 tablet by mouth daily.    . Nutritional Supplements (CHOLESTEROL DEFENSE PO) Take 1 tablet by mouth daily.     Marland Kitchen albuterol (PROVENTIL HFA;VENTOLIN HFA) 108 (90 BASE) MCG/ACT inhaler Inhale 1-2 puffs into the lungs every 6 (six) hours as needed for wheezing. 1 Inhaler 0   No current facility-administered medications on file prior to visit.   Active Ambulatory Problems    Diagnosis Date Noted  . HYPERLIPIDEMIA, MIXED 05/03/2007  . HYPERLIPIDEMIA 06/14/2010  . NICOTINE ADDICTION 04/26/2007  . URI 05/21/2008  . SCIATICA, RIGHT 07/23/2007  . BACK PAIN 06/22/2010  . FATIGUE 04/26/2007  . ABDOMINAL PAIN, LOWER 02/28/2008  . ABDOMINAL MASS 03/03/2008  . LIBIDO, DECREASED 04/26/2007  . Eczema 10/28/2013  . OSA (obstructive sleep apnea) 10/30/2013  . Knee pain, bilateral 03/24/2014   Resolved Ambulatory Problems    Diagnosis Date Noted  . No Resolved Ambulatory Problems   Past Medical History  Diagnosis Date  . Hyperlipidemia   . HIV disease      Review of Systems     Objective:   Physical Exam  BP 148/94 mmHg  Pulse 80  Temp(Src) 98.7 F (37.1 C) (Oral)  Wt 364 lb (165.109 kg)  Constitutional: He is oriented to person, place, and time. He appears well-developed and  well-nourished. No distress.  HENT:  Mouth/Throat: Oropharynx is clear and moist. No oropharyngeal exudate.  Cardiovascular: Normal rate, regular rhythm and normal heart sounds. Exam reveals no gallop and no friction rub.  No murmur heard.  Pulmonary/Chest: Effort normal and breath sounds normal. No respiratory distress. He has no wheezes.  Lymphadenopathy:  He has no cervical adenopathy.  Neurological: He is alert and oriented to person, place, and time.  Skin: Skin is warm and dry. No rash noted. No erythema.  Psychiatric: He has a normal mood and affect. His behavior is normal.        Assessment & Plan:  High risk sex = continue with taking truvada, will check for hiv  Monitoring toxicity = will check cmp to look at kidney function and if having any changes due to truvada  Pre-hypertension = recommend to check occasionally to see if elevated  And may warrant treatment if still elevated at next visit

## 2015-04-19 ENCOUNTER — Other Ambulatory Visit: Payer: Self-pay | Admitting: Licensed Clinical Social Worker

## 2015-04-19 DIAGNOSIS — Z7251 High risk heterosexual behavior: Secondary | ICD-10-CM

## 2015-04-19 MED ORDER — EMTRICITABINE-TENOFOVIR DF 200-300 MG PO TABS: 1.0000 | ORAL_TABLET | Freq: Every day | ORAL | Status: DC

## 2015-07-17 ENCOUNTER — Other Ambulatory Visit: Payer: Self-pay | Admitting: Internal Medicine

## 2015-07-17 DIAGNOSIS — Z7251 High risk heterosexual behavior: Secondary | ICD-10-CM

## 2015-10-23 ENCOUNTER — Other Ambulatory Visit: Payer: Self-pay | Admitting: Internal Medicine

## 2016-01-12 ENCOUNTER — Ambulatory Visit (INDEPENDENT_AMBULATORY_CARE_PROVIDER_SITE_OTHER): Payer: BLUE CROSS/BLUE SHIELD | Admitting: Family Medicine

## 2016-01-12 ENCOUNTER — Encounter: Payer: Self-pay | Admitting: Family Medicine

## 2016-01-12 VITALS — BP 156/98 | Temp 98.3°F | Ht 71.0 in | Wt 373.0 lb

## 2016-01-12 DIAGNOSIS — I10 Essential (primary) hypertension: Secondary | ICD-10-CM | POA: Diagnosis not present

## 2016-01-12 MED ORDER — LISINOPRIL-HYDROCHLOROTHIAZIDE 20-25 MG PO TABS: 1.0000 | ORAL_TABLET | Freq: Every day | ORAL | Status: DC

## 2016-01-12 NOTE — Progress Notes (Signed)
Pre visit review using our clinic review tool, if applicable. No additional management support is needed unless otherwise documented below in the visit note. 

## 2016-01-12 NOTE — Progress Notes (Signed)
   Subjective:    Patient ID: Jeremy Guerrero, male    DOB: July 02, 1966, 50 y.o.   MRN: [id number]  HPI Here with concerns about high BP. Over the past 2 years he has steadily been gaining weight, and his chart reveals a slow rise in BP as well. He admits to feeling dizzy and having mild headaches in the past few months. No chest pain or SOB. He had a wellness screening at work yesterday and his BP was 166/102. He quit smoking a few years ago fortunately.    Review of Systems  Constitutional: Negative.   Respiratory: Negative.   Cardiovascular: Negative.   Neurological: Positive for dizziness and headaches. Negative for tremors, seizures, syncope, facial asymmetry, speech difficulty, weakness, light-headedness and numbness.       Objective:   Physical Exam  Constitutional: He is oriented to person, place, and time.  Morbidly obese   Neck: No thyromegaly present.  Cardiovascular: Normal rate, regular rhythm, normal heart sounds and intact distal pulses.   Pulmonary/Chest: Effort normal and breath sounds normal. No respiratory distress. He has no wheezes. He has no rales.  Musculoskeletal:  Lower legs and feet show 1+ edema   Lymphadenopathy:    He has no cervical adenopathy.  Neurological: He is alert and oriented to person, place, and time.          Assessment & Plan:  HTN. He needs to lose weight and we discussed diet and exercise. Start on Lisinopril HCT daily. I asked him to schedule for a cpx and fasting labs here in 3 weeks. We will reassess the HTN at that time and will get an EKG.  Laurey Morale, MD

## 2016-01-26 ENCOUNTER — Other Ambulatory Visit (INDEPENDENT_AMBULATORY_CARE_PROVIDER_SITE_OTHER): Payer: BLUE CROSS/BLUE SHIELD

## 2016-01-26 DIAGNOSIS — Z0000 Encounter for general adult medical examination without abnormal findings: Secondary | ICD-10-CM

## 2016-01-26 LAB — CBC WITH DIFFERENTIAL/PLATELET
BASOS ABS: 0.1 10*3/uL (ref 0.0–0.1)
Basophils Relative: 0.5 % (ref 0.0–3.0)
EOS ABS: 0.1 10*3/uL (ref 0.0–0.7)
Eosinophils Relative: 1.2 % (ref 0.0–5.0)
HCT: 52.5 % — ABNORMAL HIGH (ref 39.0–52.0)
HEMOGLOBIN: 17.9 g/dL — AB (ref 13.0–17.0)
LYMPHS ABS: 2.7 10*3/uL (ref 0.7–4.0)
Lymphocytes Relative: 28.2 % (ref 12.0–46.0)
MCHC: 34 g/dL (ref 30.0–36.0)
MCV: 95.6 fl (ref 78.0–100.0)
MONO ABS: 0.7 10*3/uL (ref 0.1–1.0)
Monocytes Relative: 7.9 % (ref 3.0–12.0)
NEUTROS PCT: 62.2 % (ref 43.0–77.0)
Neutro Abs: 5.9 10*3/uL (ref 1.4–7.7)
Platelets: 209 10*3/uL (ref 150.0–400.0)
RBC: 5.49 Mil/uL (ref 4.22–5.81)
RDW: 13.6 % (ref 11.5–15.5)
WBC: 9.4 10*3/uL (ref 4.0–10.5)

## 2016-01-26 LAB — TSH: TSH: 1.05 u[IU]/mL (ref 0.35–4.50)

## 2016-01-26 LAB — LIPID PANEL
Cholesterol: 234 mg/dL — ABNORMAL HIGH (ref 0–200)
HDL: 34.2 mg/dL — ABNORMAL LOW (ref >39.00)
LDL Cholesterol: 163 mg/dL — ABNORMAL HIGH (ref 0–99)
NonHDL: 199.86
Total CHOL/HDL Ratio: 7
Triglycerides: 185 mg/dL — ABNORMAL HIGH (ref 0.0–149.0)
VLDL: 37 mg/dL (ref 0.0–40.0)

## 2016-01-26 LAB — HEPATIC FUNCTION PANEL
ALBUMIN: 4.3 g/dL (ref 3.5–5.2)
ALK PHOS: 64 U/L (ref 39–117)
ALT: 31 U/L (ref 0–53)
AST: 24 U/L (ref 0–37)
Bilirubin, Direct: 0.1 mg/dL (ref 0.0–0.3)
Total Bilirubin: 0.7 mg/dL (ref 0.2–1.2)
Total Protein: 6.8 g/dL (ref 6.0–8.3)

## 2016-01-26 LAB — POC URINALSYSI DIPSTICK (AUTOMATED)
Bilirubin, UA: NEGATIVE
GLUCOSE UA: NEGATIVE
KETONES UA: NEGATIVE
Leukocytes, UA: NEGATIVE
Nitrite, UA: NEGATIVE
Protein, UA: NEGATIVE
RBC UA: NEGATIVE
Spec Grav, UA: 1.015
UROBILINOGEN UA: 1
pH, UA: 7

## 2016-01-26 LAB — BASIC METABOLIC PANEL
BUN: 16 mg/dL (ref 6–23)
CO2: 31 mEq/L (ref 19–32)
CREATININE: 0.87 mg/dL (ref 0.40–1.50)
Calcium: 10 mg/dL (ref 8.4–10.5)
Chloride: 98 mEq/L (ref 96–112)
GFR: 98.52 mL/min (ref >60.00)
GLUCOSE: 108 mg/dL — AB (ref 70–99)
Potassium: 4.5 mEq/L (ref 3.5–5.1)
Sodium: 138 mEq/L (ref 135–145)

## 2016-01-26 LAB — PSA: PSA: 1.08 ng/mL (ref 0.10–4.00)

## 2016-02-09 ENCOUNTER — Ambulatory Visit (INDEPENDENT_AMBULATORY_CARE_PROVIDER_SITE_OTHER): Payer: BLUE CROSS/BLUE SHIELD | Admitting: Family Medicine

## 2016-02-09 ENCOUNTER — Encounter: Payer: Self-pay | Admitting: Family Medicine

## 2016-02-09 VITALS — BP 138/84 | HR 116 | Temp 98.5°F | Ht 71.0 in | Wt 366.0 lb

## 2016-02-09 DIAGNOSIS — R739 Hyperglycemia, unspecified: Secondary | ICD-10-CM | POA: Insufficient documentation

## 2016-02-09 DIAGNOSIS — Z0000 Encounter for general adult medical examination without abnormal findings: Secondary | ICD-10-CM | POA: Diagnosis not present

## 2016-02-09 NOTE — Progress Notes (Signed)
   Subjective:    Patient ID: Jeremy Guerrero, male    DOB: 1966-09-04, 50 y.o.   MRN: [id number]  HPI 50 yr old male for a well exam. This is also to follow up on HTN. One month ago he was started on Lisinopril HCT and he has tolerated this well. He has changed his diet and is working out at [employer] 3 days a week. He has lost 7 lbs.    Review of Systems  Constitutional: Negative.   HENT: Negative.   Eyes: Negative.   Respiratory: Negative.   Cardiovascular: Negative.   Gastrointestinal: Negative.   Genitourinary: Negative.   Musculoskeletal: Negative.   Skin: Negative.   Neurological: Negative.   Psychiatric/Behavioral: Negative.        Objective:   Physical Exam  Constitutional: He is oriented to person, place, and time. He appears well-developed. No distress.  Morbidly obese   HENT:  Head: Normocephalic and atraumatic.  Right Ear: External ear normal.  Left Ear: External ear normal.  Nose: Nose normal.  Mouth/Throat: Oropharynx is clear and moist. No oropharyngeal exudate.  Eyes: Conjunctivae and EOM are normal. Pupils are equal, round, and reactive to light. Right eye exhibits no discharge. Left eye exhibits no discharge. No scleral icterus.  Neck: Neck supple. No JVD present. No tracheal deviation present. No thyromegaly present.  Cardiovascular: Normal rate, regular rhythm, normal heart sounds and intact distal pulses.  Exam reveals no gallop and no friction rub.   No murmur heard. EKG normal   Pulmonary/Chest: Effort normal and breath sounds normal. No respiratory distress. He has no wheezes. He has no rales. He exhibits no tenderness.  Abdominal: Soft. Bowel sounds are normal. He exhibits no distension and no mass. There is no tenderness. There is no rebound and no guarding.  Genitourinary: Rectum normal, prostate normal and penis normal. Guaiac negative stool. No penile tenderness.  Musculoskeletal: Normal range of motion. He exhibits no edema or tenderness.    Lymphadenopathy:    He has no cervical adenopathy.  Neurological: He is alert and oriented to person, place, and time. He has normal reflexes. No cranial nerve deficit. He exhibits normal muscle tone. Coordination normal.  Skin: Skin is warm and dry. No rash noted. He is not diaphoretic. No erythema. No pallor.  Psychiatric: He has a normal mood and affect. His behavior is normal. Judgment and thought content normal.          Assessment & Plan:  Well exam. We discussed diet and exercise. His LDL and glucose are also elevated, and we will try to improve these with weight loss and exercise. Refer for a colonoscopy. Recheck BP, lipids, and glucose in 3 months.  Laurey Morale, MD

## 2016-02-09 NOTE — Progress Notes (Signed)
Pre visit review using our clinic review tool, if applicable. No additional management support is needed unless otherwise documented below in the visit note. 

## 2016-02-10 ENCOUNTER — Encounter: Payer: Self-pay | Admitting: Internal Medicine

## 2016-03-17 ENCOUNTER — Telehealth: Payer: Self-pay | Admitting: *Deleted

## 2016-03-17 NOTE — Telephone Encounter (Signed)
Dr Carlean Purl, This patient is a direct colon with you on 7-19, Wednesday in the [facility].  He has a hx of OSA, HTN, Abd.mass, never had a colon before.  He has a BMI of 51.2 in may 2017 and weight 366.0lb    Do you want an OV or can he be a direct hospital? Please advise, thanks for your time,  Marijean Niemann

## 2016-03-21 NOTE — Telephone Encounter (Signed)
Direct hospital 

## 2016-03-22 NOTE — Telephone Encounter (Signed)
I have scheduled him for 05/30/16 8:30 with a 7:00 arrival time.  Let me know Friday if that won't work

## 2016-03-22 NOTE — Telephone Encounter (Signed)
Thanks Dr Carlean Purl, Barbera Setters can you schedule please. Thanks Lelan Pons PV

## 2016-03-22 NOTE — Telephone Encounter (Signed)
Sheri, this gentleman's PV is Friday 7-7 if you can schedule him before Friday . Thanks  Lelan Pons

## 2016-03-22 NOTE — Telephone Encounter (Signed)
Noted, thanks marie PV

## 2016-04-05 ENCOUNTER — Encounter: Payer: BLUE CROSS/BLUE SHIELD | Admitting: Internal Medicine

## 2016-04-12 ENCOUNTER — Telehealth: Payer: Self-pay | Admitting: *Deleted

## 2016-04-12 NOTE — Telephone Encounter (Signed)
Patient called the front desk, asking when his medication could be refilled.  Patient's call was lost during transfer.  RN called back, left generic message notifying patient that he needed regular labs and physician follow up for this medication to be refilled.  Asked him to call back if he wanted to set up appointments. Landis Gandy, RN

## 2016-05-12 ENCOUNTER — Other Ambulatory Visit: Payer: Self-pay | Admitting: Family Medicine

## 2016-05-16 ENCOUNTER — Other Ambulatory Visit (INDEPENDENT_AMBULATORY_CARE_PROVIDER_SITE_OTHER): Payer: Managed Care, Other (non HMO)

## 2016-05-16 DIAGNOSIS — Z7251 High risk heterosexual behavior: Secondary | ICD-10-CM

## 2016-05-16 LAB — COMPREHENSIVE METABOLIC PANEL
ALT: 27 U/L (ref 9–46)
AST: 21 U/L (ref 10–35)
Albumin: 4.1 g/dL (ref 3.6–5.1)
Alkaline Phosphatase: 66 U/L (ref 40–115)
BUN: 14 mg/dL (ref 7–25)
CALCIUM: 9.5 mg/dL (ref 8.6–10.3)
CHLORIDE: 101 mmol/L (ref 98–110)
CO2: 29 mmol/L (ref 20–31)
Creat: 0.96 mg/dL (ref 0.70–1.33)
Glucose, Bld: 87 mg/dL (ref 65–99)
Potassium: 5.1 mmol/L (ref 3.5–5.3)
Sodium: 139 mmol/L (ref 135–146)
TOTAL PROTEIN: 6.7 g/dL (ref 6.1–8.1)
Total Bilirubin: 0.6 mg/dL (ref 0.2–1.2)

## 2016-05-16 NOTE — Addendum Note (Signed)
Addended by: Dolan Amen D on: 05/16/2016 04:03 PM   Modules accepted: Orders

## 2016-05-17 LAB — URINE CYTOLOGY ANCILLARY ONLY
Chlamydia: NEGATIVE
Neisseria Gonorrhea: NEGATIVE

## 2016-05-17 LAB — T-HELPER CELL (CD4) - (RCID CLINIC ONLY)
CD4 T CELL HELPER: 49 % (ref 33–55)
CD4 T Cell Abs: 1580 /uL (ref 400–2700)

## 2016-05-17 LAB — HIV-1 RNA QUANT-NO REFLEX-BLD: HIV-1 RNA Quant, Log: <1.3 Log copies/mL (ref <1.30)

## 2016-05-17 LAB — RPR

## 2016-05-18 ENCOUNTER — Ambulatory Visit (AMBULATORY_SURGERY_CENTER): Payer: Self-pay | Admitting: *Deleted

## 2016-05-18 ENCOUNTER — Encounter (INDEPENDENT_AMBULATORY_CARE_PROVIDER_SITE_OTHER): Payer: Self-pay

## 2016-05-18 VITALS — Ht 71.0 in | Wt 359.2 lb

## 2016-05-18 DIAGNOSIS — Z1211 Encounter for screening for malignant neoplasm of colon: Secondary | ICD-10-CM

## 2016-05-18 NOTE — Progress Notes (Signed)
No egg or soy allergy known to patient  No issues with past sedation with any surgeries  or procedures, no intubation problems  No diet pills per patient No home 02 use per patient  No blood thinners per patient  Pt denies issues with constipation  No A fib or A flutter   emmi declined'   

## 2016-05-23 ENCOUNTER — Encounter (HOSPITAL_COMMUNITY): Payer: Self-pay | Admitting: *Deleted

## 2016-05-23 NOTE — Progress Notes (Signed)
Showed dr germeroth 02-09-16 ekg, ok to use per dr germeroth for 05-30-16 procedure per dr germeroth.

## 2016-05-30 ENCOUNTER — Encounter: Payer: Self-pay | Admitting: Internal Medicine

## 2016-05-30 ENCOUNTER — Ambulatory Visit (HOSPITAL_COMMUNITY)
Admission: RE | Admit: 2016-05-30 | Discharge: 2016-05-30 | Disposition: A | Discharge status: Home / Self Care | Payer: Managed Care, Other (non HMO) | Source: Ambulatory Visit | Attending: Internal Medicine | Admitting: Internal Medicine

## 2016-05-30 ENCOUNTER — Ambulatory Visit (HOSPITAL_COMMUNITY): Admit: 2016-05-30 | Payer: Managed Care, Other (non HMO) | Admitting: Internal Medicine

## 2016-05-30 ENCOUNTER — Ambulatory Visit (INDEPENDENT_AMBULATORY_CARE_PROVIDER_SITE_OTHER): Payer: Managed Care, Other (non HMO) | Admitting: Internal Medicine

## 2016-05-30 ENCOUNTER — Encounter (HOSPITAL_COMMUNITY)
Admission: RE | Disposition: A | Discharge status: Home / Self Care | Payer: Self-pay | Source: Ambulatory Visit | Attending: Internal Medicine

## 2016-05-30 ENCOUNTER — Encounter (HOSPITAL_COMMUNITY): Payer: Self-pay

## 2016-05-30 ENCOUNTER — Other Ambulatory Visit (HOSPITAL_COMMUNITY)
Admission: RE | Admit: 2016-05-30 | Discharge: 2016-05-30 | Disposition: A | Discharge status: Home / Self Care | Payer: Managed Care, Other (non HMO) | Source: Ambulatory Visit | Attending: Internal Medicine | Admitting: Internal Medicine

## 2016-05-30 ENCOUNTER — Ambulatory Visit (HOSPITAL_COMMUNITY): Payer: Managed Care, Other (non HMO) | Admitting: Anesthesiology

## 2016-05-30 VITALS — BP 132/87 | HR 93 | Wt 356.0 lb

## 2016-05-30 DIAGNOSIS — G4730 Sleep apnea, unspecified: Secondary | ICD-10-CM | POA: Diagnosis not present

## 2016-05-30 DIAGNOSIS — D122 Benign neoplasm of ascending colon: Secondary | ICD-10-CM | POA: Insufficient documentation

## 2016-05-30 DIAGNOSIS — Z7251 High risk heterosexual behavior: Secondary | ICD-10-CM

## 2016-05-30 DIAGNOSIS — Z113 Encounter for screening for infections with a predominantly sexual mode of transmission: Secondary | ICD-10-CM | POA: Insufficient documentation

## 2016-05-30 DIAGNOSIS — Z6841 Body Mass Index (BMI) 40.0 and over, adult: Secondary | ICD-10-CM | POA: Insufficient documentation

## 2016-05-30 DIAGNOSIS — I10 Essential (primary) hypertension: Secondary | ICD-10-CM | POA: Diagnosis not present

## 2016-05-30 DIAGNOSIS — Z79899 Other long term (current) drug therapy: Secondary | ICD-10-CM | POA: Insufficient documentation

## 2016-05-30 DIAGNOSIS — B20 Human immunodeficiency virus [HIV] disease: Secondary | ICD-10-CM | POA: Diagnosis not present

## 2016-05-30 DIAGNOSIS — Z9989 Dependence on other enabling machines and devices: Secondary | ICD-10-CM | POA: Diagnosis not present

## 2016-05-30 DIAGNOSIS — Z87891 Personal history of nicotine dependence: Secondary | ICD-10-CM | POA: Diagnosis not present

## 2016-05-30 DIAGNOSIS — Z1211 Encounter for screening for malignant neoplasm of colon: Secondary | ICD-10-CM | POA: Diagnosis not present

## 2016-05-30 HISTORY — PX: COLONOSCOPY WITH PROPOFOL: SHX5780

## 2016-05-30 SURGERY — COLONOSCOPY WITH PROPOFOL
Anesthesia: Monitor Anesthesia Care

## 2016-05-30 MED ORDER — LIDOCAINE 2% (20 MG/ML) 5 ML SYRINGE
INTRAMUSCULAR | Status: DC | PRN
  Administered 2016-05-30 (×2): 50 mg via INTRAVENOUS

## 2016-05-30 MED ORDER — PROPOFOL 500 MG/50ML IV EMUL
INTRAVENOUS | Status: DC | PRN
  Administered 2016-05-30: 100 ug/kg/min via INTRAVENOUS

## 2016-05-30 MED ORDER — EMTRICITABINE-TENOFOVIR DF 200-300 MG PO TABS: 1.0000 | ORAL_TABLET | Freq: Every day | ORAL | 11 refills | Status: DC

## 2016-05-30 MED ORDER — SODIUM CHLORIDE 0.9 % IV SOLN: INTRAVENOUS | Status: DC

## 2016-05-30 MED ORDER — LACTATED RINGERS IV SOLN
INTRAVENOUS | Status: DC
  Administered 2016-05-30: 08:00:00 via INTRAVENOUS

## 2016-05-30 MED ORDER — PROPOFOL 10 MG/ML IV BOLUS
INTRAVENOUS | Status: DC | PRN
  Administered 2016-05-30: 30 mg via INTRAVENOUS
  Administered 2016-05-30: 50 mg via INTRAVENOUS

## 2016-05-30 SURGICAL SUPPLY — 21 items

## 2016-05-30 NOTE — Patient Instructions (Signed)
Will need to have you come back every 3 months for testing.   I will see you back in clinic in 6 months   Start taking your medication after I call you in 2 days

## 2016-05-30 NOTE — Anesthesia Postprocedure Evaluation (Signed)
Anesthesia Post Note  Patient: Jules Seliga  Procedure(s) Performed: Procedure(s) (LRB): COLONOSCOPY WITH PROPOFOL (N/A)  Patient location during evaluation: PACU Anesthesia Type: MAC Level of consciousness: awake and alert Pain management: pain level controlled Vital Signs Assessment: post-procedure vital signs reviewed and stable Respiratory status: spontaneous breathing, nonlabored ventilation, respiratory function stable and patient connected to nasal cannula oxygen Cardiovascular status: stable and blood pressure returned to baseline Anesthetic complications: no    Last Vitals:  Vitals:   05/30/16 0900 05/30/16 0910  BP: (!) 149/92 (!) 150/76  Pulse: 89 81  Resp: 20 (!) 22  Temp:      Last Pain:  Vitals:   05/30/16 0855  TempSrc: Leonard Schwartz

## 2016-05-30 NOTE — Op Note (Signed)
[facility] Patient Name: Jeremy Guerrero Procedure Date: 05/30/2016 MRN: [id number] Attending MD: Gatha Mayer , MD Date of Birth: 07-03-1966 CSN: [id number] Age: 50 Admit Type: Outpatient Procedure:                Colonoscopy Indications:              Screening for colorectal malignant neoplasm, This                            is the patient's first colonoscopy Providers:                Gatha Mayer, MD, Cleda Daub, RN, Ralene Bathe, Technician, Karis Juba, CRNA Referring MD:              Medicines:                Propofol per Anesthesia, Monitored Anesthesia Care Complications:            No immediate complications. Estimated Blood Loss:     Estimated blood loss was minimal. Procedure:                Pre-Anesthesia Assessment:                           - Prior to the procedure, a History and Physical                            was performed, and patient medications and                            allergies were reviewed. The patient's tolerance of                            previous anesthesia was also reviewed. The risks                            and benefits of the procedure and the sedation                            options and risks were discussed with the patient.                            All questions were answered, and informed consent                            was obtained. Prior Anticoagulants: The patient has                            taken no previous anticoagulant or antiplatelet                            agents. ASA Grade Assessment: III - A patient with  severe systemic disease. After reviewing the risks                            and benefits, the patient was deemed in                            satisfactory condition to undergo the procedure.                           After obtaining informed consent, the colonoscope                            was passed under direct vision.  Throughout the                            procedure, the patient's blood pressure, pulse, and                            oxygen saturations were monitored continuously. The                            [id number] scope was introduced through                            the anus and advanced to the the cecum, identified                            by appendiceal orifice and ileocecal valve. The                            ileocecal valve, appendiceal orifice, and rectum                            were photographed. The quality of the bowel                            preparation was excellent. The bowel preparation                            used was Miralax. Scope In: 8:29:38 AM Scope Out: 8:43:38 AM Scope Withdrawal Time: 0 hours 11 minutes 58 seconds  Total Procedure Duration: 0 hours 14 minutes 0 seconds  Findings:      The perianal and digital rectal examinations were normal. Pertinent       negatives include normal prostate (size, shape, and consistency).      A 2 mm polyp was found in the ascending colon. The polyp was sessile.       The polyp was removed with a cold biopsy forceps. Resection and       retrieval were complete. Verification of patient identification for the       specimen was done. Estimated blood loss was minimal.      The exam was otherwise without abnormality on direct and retroflexion       views. Impression:               -  One 2 mm polyp in the ascending colon, removed                            with a cold biopsy forceps. Resected and retrieved.                           - The examination was otherwise normal on direct                            and retroflexion views. Moderate Sedation:      Please see anesthesia notes, moderate sedation not given Recommendation:           - Patient has a contact number available for                            emergencies. The signs and symptoms of potential                            delayed complications were  discussed with the                            patient. Return to normal activities tomorrow.                            Written discharge instructions were provided to the                            patient.                           - Resume previous diet.                           - Continue present medications.                           - Repeat colonoscopy is recommended. The                            colonoscopy date will be determined after pathology                            results from today's exam become available for                            review.                           - CC TO DR. Alysia Penna Procedure Code(s):        --- Professional ---                           [phone], Colonoscopy, flexible; with biopsy, single                            or multiple Diagnosis Code(s):        ---  Professional ---                           Z12.11, Encounter for screening for malignant                            neoplasm of colon                           D12.2, Benign neoplasm of ascending colon CPT copyright 2016 American Medical Association. All rights reserved. The codes documented in this report are preliminary and upon coder review may  be revised to meet current compliance requirements. Gatha Mayer, MD 05/30/2016 8:59:38 AM This report has been signed electronically. Number of Addenda: 0

## 2016-05-30 NOTE — Anesthesia Preprocedure Evaluation (Addendum)
Anesthesia Evaluation  Patient identified by MRN, date of birth, ID band Patient awake    Reviewed: Allergy & Precautions, NPO status , Patient's Chart, lab work & pertinent test results  Airway Mallampati: III  TM Distance: >3 FB Neck ROM: Full    Dental   Pulmonary sleep apnea and Continuous Positive Airway Pressure Ventilation , former smoker,    breath sounds clear to auscultation       Cardiovascular hypertension, Pt. on medications  Rhythm:Regular Rate:Normal     Neuro/Psych negative neurological ROS     GI/Hepatic negative GI ROS, Neg liver ROS,   Endo/Other  Morbid obesity  Renal/GU negative Renal ROS     Musculoskeletal   Abdominal   Peds  Hematology negative hematology ROS (+)   Anesthesia Other Findings   Reproductive/Obstetrics                            Anesthesia Physical Anesthesia Plan  ASA: III  Anesthesia Plan: MAC   Post-op Pain Management:    Induction: Intravenous  Airway Management Planned: Natural Airway, Nasal Cannula and Simple Face Mask  Additional Equipment:   Intra-op Plan:   Post-operative Plan:   Informed Consent: I have reviewed the patients History and Physical, chart, labs and discussed the procedure including the risks, benefits and alternatives for the proposed anesthesia with the patient or authorized representative who has indicated his/her understanding and acceptance.     Plan Discussed with: CRNA  Anesthesia Plan Comments:         Anesthesia Quick Evaluation

## 2016-05-30 NOTE — Progress Notes (Signed)
RFV: PrEP  Patient ID: Jeremy Guerrero, male   DOB: Jan 05, 1966, 50 y.o.   MRN: [id number]  HPI Jeremy Guerrero is a 50yo M with history of HTN, on PreP. He reports doing well overall though he is working more hours, feeling stressed. He uses condoms with new partners, though admits not using condoms with his former partner.  Outpatient Encounter Prescriptions as of 05/30/2016  Medication Sig  . lisinopril-hydrochlorothiazide (PRINZIDE,ZESTORETIC) 20-25 MG tablet TAKE 1 TABLET BY MOUTH DAILY  . Multiple Vitamin (MULTIVITAMIN WITH MINERALS) TABS Take 1 tablet by mouth daily.  . Nutritional Supplements (CHOLESTEROL DEFENSE PO) Take 1 tablet by mouth daily.   . TRUVADA 200-300 MG tablet TAKE 1 TABLET BY MOUTH EVERY DAY   No facility-administered encounter medications on file as of 05/30/2016.      Patient Active Problem List   Diagnosis Date Noted  . Colon cancer screening   . Benign neoplasm of ascending colon   . Hyperglycemia 02/09/2016  . HTN (hypertension) 01/12/2016  . Morbid obesity ([place]) 01/12/2016  . Knee pain, bilateral 03/24/2014  . OSA (obstructive sleep apnea) 10/30/2013  . Eczema 10/28/2013  . BACK PAIN 06/22/2010  . HYPERLIPIDEMIA 06/14/2010  . ABDOMINAL MASS 03/03/2008  . ABDOMINAL PAIN, LOWER 02/28/2008  . SCIATICA, RIGHT 07/23/2007  . FATIGUE 04/26/2007  . LIBIDO, DECREASED 04/26/2007     Health Maintenance Due  Topic Date Due  . TETANUS/TDAP  07/22/1984  . COLONOSCOPY  07/23/2015  . INFLUENZA VACCINE  04/18/2016     Review of Systems  Constitutional: Negative for fever, chills, diaphoresis, activity change, appetite change, fatigue and unexpected weight change.  HENT: Negative for congestion, sore throat, rhinorrhea, sneezing, trouble swallowing and sinus pressure.  Eyes: Negative for photophobia and visual disturbance.  Respiratory: Negative for cough, chest tightness, shortness of breath, wheezing and stridor.  Cardiovascular: Negative for chest pain,  palpitations and leg swelling.  Gastrointestinal: Negative for nausea, vomiting, abdominal pain, diarrhea, constipation, blood in stool, abdominal distention and anal bleeding.  Genitourinary: Negative for dysuria, hematuria, flank pain and difficulty urinating.  Musculoskeletal: Negative for myalgias, back pain, joint swelling, arthralgias and gait problem.  Skin: Negative for color change, pallor, rash and wound.  Neurological: Negative for dizziness, tremors, weakness and light-headedness.  Hematological: Negative for adenopathy. Does not bruise/bleed easily.  Psychiatric/Behavioral: Negative for behavioral problems, confusion, sleep disturbance, dysphoric mood, decreased concentration and agitation.    Physical Exam   BP 132/87   Pulse 93   Wt (!) 356 lb (161.5 kg)   BMI 49.65 kg/m  Physical Exam  Constitutional: He is oriented to person, place, and time. He appears well-developed and well-nourished. No distress.  HENT:  Mouth/Throat: Oropharynx is clear and moist. No oropharyngeal exudate.  Cardiovascular: Normal rate, regular rhythm and normal heart sounds. Exam reveals no gallop and no friction rub.  No murmur heard.  Pulmonary/Chest: Effort normal and breath sounds normal. No respiratory distress. He has no wheezes.  Abdominal: Soft. Bowel sounds are normal. He exhibits no distension. There is no tenderness.  Lymphadenopathy:  He has no cervical adenopathy.  Neurological: He is alert and oriented to person, place, and time.  Skin: Skin is warm and dry. No rash noted. No erythema.  Psychiatric: He has a normal mood and affect. His behavior is normal.    Lab Results  Component Value Date   CD4TCELL 49 05/16/2016   Lab Results  Component Value Date   CD4TABS 1,580 05/16/2016   Lab Results  Component Value  Date   HIV1RNAQUANT <20 05/16/2016   Lab Results  Component Value Date   HEPBSAB NEG 05/19/2014   No results found for: RPR  CBC Lab Results  Component Value  Date   WBC 9.4 01/26/2016   RBC 5.49 01/26/2016   HGB 17.9 (H) 01/26/2016   HCT 52.5 (H) 01/26/2016   PLT 209.0 01/26/2016   MCV 95.6 01/26/2016   MCHC 34.0 01/26/2016   RDW 13.6 01/26/2016   LYMPHSABS 2.7 01/26/2016   MONOABS 0.7 01/26/2016   EOSABS 0.1 01/26/2016   BASOSABS 0.1 01/26/2016   BMET Lab Results  Component Value Date   NA 139 05/16/2016   K 5.1 05/16/2016   CL 101 05/16/2016   CO2 29 05/16/2016   GLUCOSE 87 05/16/2016   BUN 14 05/16/2016   CREATININE 0.96 05/16/2016   CALCIUM 9.5 05/16/2016   GFRNONAA >89 04/15/2015   GFRAA >89 04/15/2015     Assessment and Plan   Prep = will refill truvada, has been off for one month, will retest for hiv   Health maintenance = getting flu vac next week at work  Sexual health = will check gc/chlam of mouth

## 2016-05-30 NOTE — H&P (Signed)
[facility] History and Physical   Primary Care Physician:  Laurey Morale, MD   Reason for Procedure:   colon cancer screening  Plan:    Colonoscopy - The risks and benefits as well as alternatives of endoscopic procedure(s) have been discussed and reviewed. All questions answered. The patient agrees to proceed.       HPI: Jeremy Guerrero is a 50 y.o. male here for preventive colonoscopy.   Past Medical History:  Diagnosis Date  . Hyperlipidemia   . Hypertension   . Sciatica   . Sleep apnea    wears cpap    Past Surgical History:  Procedure Laterality Date  . KNEE SURGERY Left 2015   torn meniscus    Prior to Admission medications   Medication Sig Start Date End Date Taking? Authorizing Provider  bisacodyl (DULCOLAX) 5 MG EC tablet Take 5 mg by mouth once. X 4 for colon 9-12 Tuesday   Yes Historical Provider, MD  lisinopril-hydrochlorothiazide (PRINZIDE,ZESTORETIC) 20-25 MG tablet TAKE 1 TABLET BY MOUTH DAILY 05/12/16  Yes Laurey Morale, MD  Multiple Vitamin (MULTIVITAMIN WITH MINERALS) TABS Take 1 tablet by mouth daily.   Yes Historical Provider, MD  Nutritional Supplements (CHOLESTEROL DEFENSE PO) Take 1 tablet by mouth daily.    Yes Historical Provider, MD  polyethylene glycol powder (MIRALAX) powder Take 1 Container by mouth once. For colon 9-12 [facility]   Yes Historical Provider, MD  TRUVADA 200-300 MG tablet TAKE 1 TABLET BY MOUTH EVERY DAY 07/19/15  Yes Carlyle Basques, MD    Current Facility-Administered Medications  Medication Dose Route Frequency Provider Last Rate Last Dose  . lactated ringers infusion   Intravenous Continuous Garlan Fair, MD        Allergies as of 03/22/2016  . (No Known Allergies)    Family History  Problem Relation Age of Onset  . Healthy Sister   . Healthy Brother   . Ovarian cancer Sister   . Healthy Sister   . Healthy Mother   . Diabetes Father   . Diabetes Maternal Grandfather   . Heart attack Maternal  Grandfather   . Heart attack Other   . Cancer Other     throat  . Colon cancer Neg Hx   . Colon polyps Neg Hx   . Esophageal cancer Neg Hx   . Rectal cancer Neg Hx   . Stomach cancer Neg Hx     Social History   Social History  . Marital status: Single    Spouse name: N/A  . Number of children: N/A  . Years of education: N/A   Occupational History  . Advice worker [employer]   Social History Main Topics  . Smoking status: Former Smoker    Packs/day: 1.00    Years: 30.00    Types: Cigarettes    Quit date: 11/27/2013  . Smokeless tobacco: Never Used  . Alcohol use 0.0 oz/week     Comment: 6 beers a month  . Drug use: No  . Sexual activity: Yes    Partners: Male    Birth control/ protection: None   Other Topics Concern  . Not on file   Social History Narrative  . No narrative on file    Review of Systems:  All other review of systems negative except as mentioned in the HPI.  Physical Exam: Vital signs in last 24 hours: Temp:  [97.7 F (36.5 C)] 97.7 F (36.5 C) (09/12 0731) Pulse Rate:  [90] 90 (09/12 0731) Resp:  [  26] 26 (09/12 0731) BP: (141)/(87) 141/87 (09/12 0731) SpO2:  [94 %] 94 % (09/12 0731) Weight:  [359 lb (162.8 kg)] 359 lb (162.8 kg) (09/12 0731)   General:   Alert,  Well-developed, well-nourished, pleasant and cooperative in NAD Lungs:  Clear throughout to auscultation.   Heart:  Regular rate and rhythm; no murmurs, clicks, rubs,  or gallops. Abdomen:  Soft, nontender and nondistended. Normal bowel sounds.   Neuro/Psych:  Alert and cooperative. Normal mood and affect. A and O x 3   @Carl  Simonne Maffucci, MD, [facility] [phone] (pager) 05/30/2016 8:19 AM@

## 2016-05-30 NOTE — Discharge Instructions (Signed)
° °  I found and removed one tiny polyp that looks benign - all else ok. I will let you know pathology results and when to have another routine colonoscopy by mail.  I appreciate the opportunity to care for you. Gatha Mayer, MD, [facility]

## 2016-05-30 NOTE — Transfer of Care (Signed)
Immediate Anesthesia Transfer of Care Note  Patient: Jeremy Guerrero  Procedure(s) Performed: Procedure(s): COLONOSCOPY WITH PROPOFOL (N/A)  Patient Location: PACU  Anesthesia Type:MAC  Level of Consciousness: awake, alert  and oriented  Airway & Oxygen Therapy: Patient Spontanous Breathing and Patient connected to face mask oxygen  Post-op Assessment: Report given to RN and Post -op Vital signs reviewed and stable  Post vital signs: Reviewed and stable  Last Vitals:  Vitals:   05/30/16 0731  BP: (!) 141/87  Pulse: 90  Resp: (!) 26  Temp: 36.5 C    Last Pain:  Vitals:   05/30/16 0731  TempSrc: Oral         Complications: No apparent anesthesia complications

## 2016-05-31 ENCOUNTER — Encounter (HOSPITAL_COMMUNITY): Payer: Self-pay | Admitting: Internal Medicine

## 2016-05-31 LAB — HIV ANTIBODY (ROUTINE TESTING W REFLEX): HIV: NONREACTIVE

## 2016-06-01 LAB — CYTOLOGY, (ORAL, ANAL, URETHRAL) ANCILLARY ONLY
Chlamydia: NEGATIVE
NEISSERIA GONORRHEA: NEGATIVE

## 2016-06-02 ENCOUNTER — Encounter: Payer: Self-pay | Admitting: Internal Medicine

## 2016-06-02 ENCOUNTER — Encounter: Payer: Self-pay | Admitting: *Deleted

## 2016-06-02 DIAGNOSIS — Z7251 High risk heterosexual behavior: Secondary | ICD-10-CM | POA: Insufficient documentation

## 2016-06-02 DIAGNOSIS — Z86010 Personal history of colonic polyps: Secondary | ICD-10-CM

## 2016-06-02 DIAGNOSIS — Z860101 Personal history of adenomatous and serrated colon polyps: Secondary | ICD-10-CM

## 2016-06-02 HISTORY — DX: Personal history of colonic polyps: Z86.010

## 2016-06-02 HISTORY — DX: Personal history of adenomatous and serrated colon polyps: Z86.0101

## 2016-06-02 NOTE — Progress Notes (Signed)
2 mm adenoma Colon recall 2024 - 7 yrs Letter created

## 2016-06-05 ENCOUNTER — Telehealth: Payer: Self-pay | Admitting: *Deleted

## 2016-06-05 NOTE — Telephone Encounter (Signed)
Resubmitted prior authorization for truvada for PREP.

## 2016-06-07 NOTE — Telephone Encounter (Addendum)
PA Approved - 06/05/16-09/04/16 by [facility] per faxed information.  Per [facility] the patient has already picked it up.

## 2016-06-09 NOTE — Telephone Encounter (Signed)
Thanks

## 2016-07-06 ENCOUNTER — Emergency Department (HOSPITAL_COMMUNITY)
Admission: EM | Admit: 2016-07-06 | Discharge: 2016-07-06 | Disposition: A | Discharge status: Home / Self Care | Payer: Managed Care, Other (non HMO) | Attending: Emergency Medicine | Admitting: Emergency Medicine

## 2016-07-06 ENCOUNTER — Emergency Department (HOSPITAL_COMMUNITY): Payer: Managed Care, Other (non HMO)

## 2016-07-06 ENCOUNTER — Encounter (HOSPITAL_COMMUNITY): Payer: Self-pay | Admitting: Emergency Medicine

## 2016-07-06 DIAGNOSIS — R1011 Right upper quadrant pain: Secondary | ICD-10-CM

## 2016-07-06 DIAGNOSIS — R112 Nausea with vomiting, unspecified: Secondary | ICD-10-CM

## 2016-07-06 DIAGNOSIS — Z87891 Personal history of nicotine dependence: Secondary | ICD-10-CM | POA: Insufficient documentation

## 2016-07-06 DIAGNOSIS — R072 Precordial pain: Secondary | ICD-10-CM | POA: Diagnosis present

## 2016-07-06 DIAGNOSIS — R0789 Other chest pain: Secondary | ICD-10-CM

## 2016-07-06 DIAGNOSIS — I10 Essential (primary) hypertension: Secondary | ICD-10-CM | POA: Diagnosis not present

## 2016-07-06 DIAGNOSIS — R1010 Upper abdominal pain, unspecified: Secondary | ICD-10-CM

## 2016-07-06 DIAGNOSIS — R197 Diarrhea, unspecified: Secondary | ICD-10-CM | POA: Insufficient documentation

## 2016-07-06 LAB — CBC
HEMATOCRIT: 53.8 % — AB (ref 39.0–52.0)
HEMOGLOBIN: 18.5 g/dL — AB (ref 13.0–17.0)
MCH: 33.2 pg (ref 26.0–34.0)
MCHC: 34.4 g/dL (ref 30.0–36.0)
MCV: 96.6 fL (ref 78.0–100.0)
Platelets: 204 10*3/uL (ref 150–400)
RBC: 5.57 MIL/uL (ref 4.22–5.81)
RDW: 13.4 % (ref 11.5–15.5)
WBC: 10.9 10*3/uL — ABNORMAL HIGH (ref 4.0–10.5)

## 2016-07-06 LAB — I-STAT TROPONIN, ED: Troponin i, poc: 0 ng/mL (ref 0.00–0.08)

## 2016-07-06 LAB — HEPATIC FUNCTION PANEL
ALBUMIN: 4.3 g/dL (ref 3.5–5.0)
ALT: 27 U/L (ref 17–63)
AST: 26 U/L (ref 15–41)
Alkaline Phosphatase: 67 U/L (ref 38–126)
BILIRUBIN DIRECT: 0.2 mg/dL (ref 0.1–0.5)
Indirect Bilirubin: 0.7 mg/dL (ref 0.3–0.9)
TOTAL PROTEIN: 7.4 g/dL (ref 6.5–8.1)
Total Bilirubin: 0.9 mg/dL (ref 0.3–1.2)

## 2016-07-06 LAB — BASIC METABOLIC PANEL
ANION GAP: 8 (ref 5–15)
BUN: 16 mg/dL (ref 6–20)
CO2: 30 mmol/L (ref 22–32)
Calcium: 9.6 mg/dL (ref 8.9–10.3)
Chloride: 99 mmol/L — ABNORMAL LOW (ref 101–111)
Creatinine, Ser: 0.97 mg/dL (ref 0.61–1.24)
GLUCOSE: 100 mg/dL — AB (ref 65–99)
POTASSIUM: 4.2 mmol/L (ref 3.5–5.1)
Sodium: 137 mmol/L (ref 135–145)

## 2016-07-06 LAB — LIPASE, BLOOD: Lipase: 20 U/L (ref 11–51)

## 2016-07-06 MED ORDER — DICYCLOMINE HCL 20 MG PO TABS: 20.0000 mg | ORAL_TABLET | Freq: Two times a day (BID) | ORAL | 0 refills | Status: DC

## 2016-07-06 MED ORDER — ONDANSETRON HCL 4 MG/2ML IJ SOLN
4.0000 mg | Freq: Once | INTRAMUSCULAR | Status: AC
  Administered 2016-07-06: 4 mg via INTRAVENOUS
  Filled 2016-07-06: qty 2

## 2016-07-06 MED ORDER — SODIUM CHLORIDE 0.9 % IV BOLUS (SEPSIS)
1000.0000 mL | Freq: Once | INTRAVENOUS | Status: AC
  Administered 2016-07-06: 1000 mL via INTRAVENOUS

## 2016-07-06 MED ORDER — ONDANSETRON HCL 4 MG PO TABS: 4.0000 mg | ORAL_TABLET | Freq: Four times a day (QID) | ORAL | 0 refills | Status: DC

## 2016-07-06 NOTE — ED Notes (Signed)
US at bedside

## 2016-07-06 NOTE — ED Notes (Signed)
Lab called and lab confirmed they had the blood for the lipase and hepatic function.  Lab will run the lab.

## 2016-07-06 NOTE — ED Provider Notes (Signed)
[facility] DEPT Provider Note   CSN: [id number] Arrival date & time: 07/06/16  1830     History   Chief Complaint Chief Complaint  Patient presents with  . Chest Pain    HPI Jeremy Guerrero is a 50 y.o. male who presents with chest pain. PMH significant for HTN and obesity. He states that he started to have substernal chest pain 2 days ago. It was severe, sharp, and radiated to the right side of his chest. It is intermittent, lasting about 5 sec each time and occurs every couple hours. He has never had this pain before. Denies cardiac or pulmonary hx. Never had a stress test. Yesterday he started to have diffuse upper abdominal pain. It is constant and feels like a dull ache. Worse with PO intake which causes him to become nausea and have dry heaves. Reports associated HA, lightheadedness, bilateral lateral neck pain, and diarrhea. Denies fever, chills, ear pain, sore throat, SOB, cough, vomiting, melena/hematochezia. Denies previous abdominal surgeries. Had a colonoscopy on 9/12 which was remarkable for a colon polyp which was removed.   HPI  Past Medical History:  Diagnosis Date  . History of adenomatous polyp of colon 06/02/2016  . Hyperlipidemia   . Hypertension   . Sciatica   . Sleep apnea    wears cpap    Patient Active Problem List   Diagnosis Date Noted  . History of adenomatous polyp of colon 06/02/2016  . High risk sexual behavior 06/02/2016  . Colon cancer screening   . Benign neoplasm of ascending colon   . Hyperglycemia 02/09/2016  . HTN (hypertension) 01/12/2016  . Morbid obesity ([place]) 01/12/2016  . Knee pain, bilateral 03/24/2014  . OSA (obstructive sleep apnea) 10/30/2013  . Eczema 10/28/2013  . BACK PAIN 06/22/2010  . HYPERLIPIDEMIA 06/14/2010  . ABDOMINAL MASS 03/03/2008  . ABDOMINAL PAIN, LOWER 02/28/2008  . SCIATICA, RIGHT 07/23/2007  . FATIGUE 04/26/2007  . LIBIDO, DECREASED 04/26/2007    Past Surgical History:  Procedure Laterality Date    . COLONOSCOPY WITH PROPOFOL N/A 05/30/2016   Procedure: COLONOSCOPY WITH PROPOFOL;  Surgeon: Gatha Mayer, MD;  Location: [facility]COPY;  Service: Endoscopy;  Laterality: N/A;  . KNEE SURGERY Left 2015   torn meniscus       Home Medications    Prior to Admission medications   Medication Sig Start Date End Date Taking? Authorizing Provider  lisinopril-hydrochlorothiazide (PRINZIDE,ZESTORETIC) 20-25 MG tablet TAKE 1 TABLET BY MOUTH DAILY 05/12/16  Yes Laurey Morale, MD  naproxen sodium (ANAPROX) 220 MG tablet Take 660 mg by mouth 2 (two) times daily as needed (pain).   Yes Historical Provider, MD  emtricitabine-tenofovir (TRUVADA) 200-300 MG tablet Take 1 tablet by mouth daily. Patient not taking: Reported on 07/06/2016 05/30/16   Carlyle Basques, MD    Family History Family History  Problem Relation Age of Onset  . Healthy Sister   . Healthy Brother   . Ovarian cancer Sister   . Healthy Sister   . Healthy Mother   . Diabetes Father   . Diabetes Maternal Grandfather   . Heart attack Maternal Grandfather   . Heart attack Other   . Cancer Other     throat  . Colon cancer Neg Hx   . Colon polyps Neg Hx   . Esophageal cancer Neg Hx   . Rectal cancer Neg Hx   . Stomach cancer Neg Hx     Social History Social History  Substance Use Topics  . Smoking status:  Former Smoker    Packs/day: 1.00    Years: 30.00    Types: Cigarettes    Quit date: 11/27/2013  . Smokeless tobacco: Never Used  . Alcohol use 0.0 oz/week     Comment: 6 beers a month     Allergies   Review of patient's allergies indicates no known allergies.   Review of Systems Review of Systems  Constitutional: Negative for chills and fever.  HENT: Negative for ear pain and sore throat.   Respiratory: Negative for cough and shortness of breath.   Cardiovascular: Positive for chest pain.  Gastrointestinal: Positive for abdominal pain, diarrhea and nausea. Negative for vomiting.     Physical Exam Updated  Vital Signs BP 114/77 (BP Location: Left Arm)   Pulse 86   Temp 98.3 F (36.8 C) (Oral)   Resp 25   SpO2 91%   Physical Exam  Constitutional: He is oriented to person, place, and time. He appears well-developed and well-nourished. No distress.  Obese, NAD, pleasant  HENT:  Head: Normocephalic and atraumatic.  Mouth/Throat: Uvula is midline, oropharynx is clear and moist and mucous membranes are normal.  Eyes: Conjunctivae are normal. Pupils are equal, round, and reactive to light. Right eye exhibits no discharge. Left eye exhibits no discharge. No scleral icterus.  Neck: Normal range of motion. Neck supple.  No midline tenderness. Bilateral SCM tenderness  Cardiovascular: Normal rate and regular rhythm.  Exam reveals no gallop and no friction rub.   No murmur heard. Pulmonary/Chest: Effort normal and breath sounds normal. No respiratory distress. He has no wheezes. He has no rales. He exhibits no tenderness.  Abdominal: Soft. Bowel sounds are normal. He exhibits no distension and no mass. There is no tenderness. There is no rebound and no guarding. No hernia.  Musculoskeletal: He exhibits no edema.  Lymphadenopathy:    He has no cervical adenopathy.  Neurological: He is alert and oriented to person, place, and time.  Skin: Skin is warm and dry.  Psychiatric: He has a normal mood and affect.  Nursing note and vitals reviewed.    ED Treatments / Results  Labs (all labs ordered are listed, but only abnormal results are displayed) Labs Reviewed  BASIC METABOLIC PANEL - Abnormal; Notable for the following:       Result Value   Chloride 99 (*)    Glucose, Bld 100 (*)    All other components within normal limits  CBC - Abnormal; Notable for the following:    WBC 10.9 (*)    Hemoglobin 18.5 (*)    HCT 53.8 (*)    All other components within normal limits  HEPATIC FUNCTION PANEL  LIPASE, BLOOD  I-STAT TROPOININ, ED    EKG  EKG Interpretation None       Radiology Dg  Chest 2 View  Result Date: 07/06/2016 CLINICAL DATA:  Intermittent sternal RIGHT chest pain. Nausea, vomiting and headache today. History of hypertension. EXAM: CHEST  2 VIEW COMPARISON:  Chest radiograph June 15, 2006 FINDINGS: Cardiomediastinal silhouette is normal. No pleural effusions or focal consolidations. Trachea projects midline and there is no pneumothorax. Soft tissue planes and included osseous structures are non-suspicious. Large body habitus. IMPRESSION: No acute cardiopulmonary process. Electronically Signed   By: Elon Alas M.D.   On: 07/06/2016 20:57   US Abdomen Limited Ruq  Result Date: 07/06/2016 CLINICAL DATA:  Acute onset of right upper quadrant abdominal pain. Nausea and vomiting. Initial encounter. EXAM: US ABDOMEN LIMITED - RIGHT UPPER QUADRANT COMPARISON:  CT of the abdomen and pelvis from 06/10/2008 FINDINGS: Gallbladder: Vague sludge is noted within the gallbladder. No definite stones are seen. No gallbladder wall thickening or pericholecystic fluid is seen. No ultrasonographic Murphy's sign is elicited. Common bile duct: Diameter: 0.4 cm, within normal limits in caliber, though not fully assessed distally. Liver: No focal lesion identified. Diffusely increased parenchymal echogenicity likely reflects fatty infiltration. IMPRESSION: 1. No acute abnormality seen at the right upper quadrant. 2. Vague sludge noted within the gallbladder. Gallbladder otherwise unremarkable. 3. Diffuse fatty infiltration within the liver. Electronically Signed   By: Garald Balding M.D.   On: 07/06/2016 22:46    Procedures Procedures (including critical care time)  Medications Ordered in ED Medications  sodium chloride 0.9 % bolus 1,000 mL (0 mLs Intravenous Stopped 07/06/16 2231)  ondansetron (ZOFRAN) injection 4 mg (4 mg Intravenous Given 07/06/16 2133)     Initial Impression / Assessment and Plan / ED Course  I have reviewed the triage vital signs and the nursing  notes.  Pertinent labs & imaging results that were available during my care of the patient were reviewed by me and considered in my medical decision making (see chart for details).  Clinical Course   50 year old male presents with atypical chest pain and abdominal pain with N/V/D. Most likely viral process. Patient is afebrile, not tachycardic or tachypneic, normotensive, and not hypoxic. CBC remarkable for mild leukocytosis of 10.9. CMP overall unremarkable. Lipase normal. Troponin is 0. EKG is NSR. CXR is clear. RUQ remarkable for sludge without gallstones or evidence of cholecystitis. Discussed results with patient. HEART score is 2. Do not feel delta trop is necessary as pain has been intermittent for 2 days. He is tolerating PO. Will d/c with Zofran and Bentyl. Follow up with PCP. Patient is NAD, non-toxic, with stable VS. Patient is informed of clinical course, understands medical decision making process, and agrees with plan. Opportunity for questions provided and all questions answered. Return precautions given.   Final Clinical Impressions(s) / ED Diagnoses   Final diagnoses:  RUQ pain  Chest pain, atypical  Pain of upper abdomen  Nausea vomiting and diarrhea    New Prescriptions New Prescriptions   DICYCLOMINE (BENTYL) 20 MG TABLET    Take 1 tablet (20 mg total) by mouth 2 (two) times daily.   ONDANSETRON (ZOFRAN) 4 MG TABLET    Take 1 tablet (4 mg total) by mouth every 6 (six) hours.     Recardo Evangelist, PA-C 07/06/16 [place], MD 07/07/16 [phone]

## 2016-07-06 NOTE — ED Triage Notes (Addendum)
Pt reports mid-CP that began this am. Radiated to R side and then had epigastric pain. Accompanied by lightheadedness at work and dry heaves. No SOB. Also having bilateral neck and head pain.

## 2016-07-10 ENCOUNTER — Ambulatory Visit (INDEPENDENT_AMBULATORY_CARE_PROVIDER_SITE_OTHER): Payer: Managed Care, Other (non HMO) | Admitting: Family Medicine

## 2016-07-10 VITALS — BP 110/72 | Temp 98.4°F | Wt 351.0 lb

## 2016-07-10 DIAGNOSIS — R1013 Epigastric pain: Secondary | ICD-10-CM

## 2016-07-11 ENCOUNTER — Encounter: Payer: Self-pay | Admitting: Family Medicine

## 2016-07-11 NOTE — Progress Notes (Signed)
   Subjective:    Patient ID: Jeremy Guerrero, male    DOB: 11/24/1965, 50 y.o.   MRN: [id number]  HPI Here for 6 days of generalized weakness, lightheadedness, nausea without vomiting, epigastric pain, and diarrhea. No fever. He went to the ER on 07-06-16 and had a normal EKG. His labs were remarkable only for a mildly elevated WBC count. An US showed sludge in the gall bladder. He was given IV fluids and Zofran, and he improved a bit. Today he feels about like he did last week, no better or worse. He is able to eat and drink. No recent travel hx.    Review of Systems  Constitutional: Positive for fatigue. Negative for chills, diaphoresis and fever.  Respiratory: Negative.   Cardiovascular: Negative.   Gastrointestinal: Positive for abdominal pain, diarrhea and nausea. Negative for abdominal distention, anal bleeding, blood in stool, constipation and vomiting.  Genitourinary: Negative.   Neurological: Negative.        Objective:   Physical Exam  Constitutional: He is oriented to person, place, and time. He appears well-developed and well-nourished. No distress.  Eyes: No scleral icterus.  Neck: No thyromegaly present.  Cardiovascular: Normal rate, regular rhythm, normal heart sounds and intact distal pulses.   Pulmonary/Chest: Effort normal and breath sounds normal.  Abdominal: Soft. Bowel sounds are normal. He exhibits no distension and no mass. There is no rebound and no guarding.  Moderately tender in the RUQ  Lymphadenopathy:    He has no cervical adenopathy.  Neurological: He is alert and oriented to person, place, and time.          Assessment & Plan:  Epigastric pain with nausea and diarrhea. This is most likely due to a viral illness or to gall bladder disease. We agreed to monitor this for now. He will drink plenty of fluids. He will recheck here tomorrow for follow up lab work.  Laurey Morale, MD

## 2016-09-13 ENCOUNTER — Telehealth: Payer: Self-pay | Admitting: *Deleted

## 2016-09-13 DIAGNOSIS — Z7253 High risk bisexual behavior: Secondary | ICD-10-CM

## 2016-09-13 NOTE — Telephone Encounter (Signed)
Received PA request for Truvada, used as Prep.  Patient should have scheduled 3 month lab for early December.  Patient will come 12/28 9:00 for lab.  Orders placed per protocol. PA request submitted. Landis Gandy, RN

## 2016-09-14 ENCOUNTER — Other Ambulatory Visit: Payer: Managed Care, Other (non HMO)

## 2016-09-14 DIAGNOSIS — Z7253 High risk bisexual behavior: Secondary | ICD-10-CM

## 2016-09-14 LAB — COMPLETE METABOLIC PANEL WITH GFR
ALK PHOS: 66 U/L (ref 40–115)
ALT: 29 U/L (ref 9–46)
AST: 23 U/L (ref 10–35)
Albumin: 4.1 g/dL (ref 3.6–5.1)
BUN: 13 mg/dL (ref 7–25)
CHLORIDE: 99 mmol/L (ref 98–110)
CO2: 32 mmol/L — AB (ref 20–31)
Calcium: 9.5 mg/dL (ref 8.6–10.3)
Creat: 0.81 mg/dL (ref 0.70–1.33)
GFR, Est African American: >89 mL/min (ref >=60)
GLUCOSE: 76 mg/dL (ref 65–99)
POTASSIUM: 4.6 mmol/L (ref 3.5–5.3)
SODIUM: 138 mmol/L (ref 135–146)
Total Bilirubin: 0.5 mg/dL (ref 0.2–1.2)
Total Protein: 7 g/dL (ref 6.1–8.1)

## 2016-09-15 LAB — HIV ANTIBODY (ROUTINE TESTING W REFLEX): HIV 1&2 Ab, 4th Generation: NONREACTIVE

## 2016-10-11 ENCOUNTER — Other Ambulatory Visit: Payer: Self-pay | Admitting: *Deleted

## 2016-10-11 DIAGNOSIS — B20 Human immunodeficiency virus [HIV] disease: Secondary | ICD-10-CM

## 2016-10-11 MED ORDER — EMTRICITABINE-TENOFOVIR DF 200-300 MG PO TABS: 1.0000 | ORAL_TABLET | Freq: Every day | ORAL | 2 refills | Status: DC

## 2016-10-25 ENCOUNTER — Other Ambulatory Visit: Payer: Self-pay | Admitting: *Deleted

## 2016-10-25 DIAGNOSIS — Z7251 High risk heterosexual behavior: Secondary | ICD-10-CM

## 2016-10-25 MED ORDER — EMTRICITABINE-TENOFOVIR DF 200-300 MG PO TABS: 1.0000 | ORAL_TABLET | Freq: Every day | ORAL | 2 refills | Status: DC

## 2016-11-27 ENCOUNTER — Other Ambulatory Visit: Payer: Self-pay | Admitting: *Deleted

## 2016-11-27 ENCOUNTER — Other Ambulatory Visit (HOSPITAL_COMMUNITY)
Admission: RE | Admit: 2016-11-27 | Discharge: 2016-11-27 | Disposition: A | Discharge status: Home / Self Care | Payer: Managed Care, Other (non HMO) | Source: Ambulatory Visit | Attending: Internal Medicine | Admitting: Internal Medicine

## 2016-11-27 ENCOUNTER — Other Ambulatory Visit: Payer: Managed Care, Other (non HMO)

## 2016-11-27 DIAGNOSIS — Z113 Encounter for screening for infections with a predominantly sexual mode of transmission: Secondary | ICD-10-CM | POA: Diagnosis not present

## 2016-11-27 DIAGNOSIS — Z7251 High risk heterosexual behavior: Secondary | ICD-10-CM

## 2016-11-27 LAB — BASIC METABOLIC PANEL
BUN: 16 mg/dL (ref 7–25)
CHLORIDE: 101 mmol/L (ref 98–110)
CO2: 29 mmol/L (ref 20–31)
CREATININE: 0.93 mg/dL (ref 0.70–1.33)
Calcium: 9.5 mg/dL (ref 8.6–10.3)
Glucose, Bld: 78 mg/dL (ref 65–99)
Potassium: 4.4 mmol/L (ref 3.5–5.3)
Sodium: 141 mmol/L (ref 135–146)

## 2016-11-27 MED ORDER — EMTRICITABINE-TENOFOVIR DF 200-300 MG PO TABS: 1.0000 | ORAL_TABLET | Freq: Every day | ORAL | 5 refills | Status: DC

## 2016-11-28 LAB — HIV ANTIBODY (ROUTINE TESTING W REFLEX): HIV 1&2 Ab, 4th Generation: NONREACTIVE

## 2016-11-28 LAB — RPR

## 2016-11-29 LAB — URINE CYTOLOGY ANCILLARY ONLY
CHLAMYDIA, DNA PROBE: NEGATIVE
NEISSERIA GONORRHEA: NEGATIVE

## 2016-12-12 ENCOUNTER — Ambulatory Visit (INDEPENDENT_AMBULATORY_CARE_PROVIDER_SITE_OTHER): Payer: Managed Care, Other (non HMO) | Admitting: Internal Medicine

## 2016-12-12 ENCOUNTER — Encounter: Payer: Self-pay | Admitting: Internal Medicine

## 2016-12-12 ENCOUNTER — Other Ambulatory Visit (HOSPITAL_COMMUNITY)
Admission: RE | Admit: 2016-12-12 | Discharge: 2016-12-12 | Disposition: A | Discharge status: Home / Self Care | Payer: Managed Care, Other (non HMO) | Source: Ambulatory Visit | Attending: Internal Medicine | Admitting: Internal Medicine

## 2016-12-12 VITALS — BP 128/85 | HR 78 | Temp 98.1°F | Wt 358.0 lb

## 2016-12-12 DIAGNOSIS — Z113 Encounter for screening for infections with a predominantly sexual mode of transmission: Secondary | ICD-10-CM | POA: Diagnosis not present

## 2016-12-12 DIAGNOSIS — Z7251 High risk heterosexual behavior: Secondary | ICD-10-CM

## 2016-12-12 DIAGNOSIS — Z79899 Other long term (current) drug therapy: Secondary | ICD-10-CM

## 2016-12-12 MED ORDER — EMTRICITABINE-TENOFOVIR DF 200-300 MG PO TABS: 1.0000 | ORAL_TABLET | Freq: Every day | ORAL | 2 refills | Status: DC

## 2016-12-12 MED ORDER — EMTRICITABINE-TENOFOVIR DF 200-300 MG PO TABS: 1.0000 | ORAL_TABLET | Freq: Every day | ORAL | 5 refills | Status: DC

## 2016-12-12 MED ORDER — LISINOPRIL-HYDROCHLOROTHIAZIDE 20-25 MG PO TABS: 1.0000 | ORAL_TABLET | Freq: Every day | ORAL | 10 refills | Status: DC

## 2016-12-12 NOTE — Progress Notes (Signed)
HPI: Jeremy Guerrero is a 51 y.o. male who is here for his PreP visit with Dr. Baxter Flattery  Allergies: No Known Allergies  Vitals: Temp: 98.1 F (36.7 C) (03/27 0902) BP: 128/85 (03/27 0902) Pulse Rate: 78 (03/27 0902)  Past Medical History: Past Medical History:  Diagnosis Date  . History of adenomatous polyp of colon 06/02/2016  . Hyperlipidemia   . Hypertension   . Sciatica   . Sleep apnea    wears cpap    Social History: Social History   Social History  . Marital status: Single    Spouse name: N/A  . Number of children: N/A  . Years of education: N/A   Occupational History  . [employer]   Social History Main Topics  . Smoking status: Former Smoker    Packs/day: 1.00    Years: 30.00    Types: Cigarettes    Quit date: 11/27/2013  . Smokeless tobacco: Never Used  . Alcohol use 0.0 oz/week     Comment: 6 beers a month  . Drug use: No  . Sexual activity: Yes    Partners: Male    Birth control/ protection: None   Other Topics Concern  . None   Social History Narrative  . None    Previous Regimen:   Current Regimen: Truvada  Labs: HIV 1 RNA Quant (copies/mL)  Date Value  05/16/2016 <20  03/24/2015 <20  06/30/2014 <20   CD4 T Cell Abs (/uL)  Date Value  05/16/2016 1,580   Hep B S Ab (no units)  Date Value  05/19/2014 NEG   Hepatitis B Surface Ag (no units)  Date Value  05/19/2014 NEGATIVE   HCV Ab (no units)  Date Value  05/19/2014 NEGATIVE    CrCl: CrCl cannot be calculated (Unknown ideal weight.).  Lipids:    Component Value Date/Time   CHOL 234 (H) 01/26/2016 0820   TRIG 185.0 (H) 01/26/2016 0820   HDL 34.20 (L) 01/26/2016 0820   CHOLHDL 7 01/26/2016 0820   VLDL 37.0 01/26/2016 0820   LDLCALC 163 (H) 01/26/2016 0820    Assessment: Butch has been on PreP for a while. Dr. Baxter Flattery referred him to f/u with pharmacy from now on. He currently has [facility]. Tx his prescription to [facility] along with his  Prinzide. His HIV ab came back neg 2 wks ago.   Recommendations:  Cont Truvada 1 PO qday x3 month only F/u in 3 months  Onnie Boer, PharmD, BCPS, AAHIVP, CPP Clinical Infectious [facility] 12/12/2016, 9:50 AM

## 2016-12-12 NOTE — Progress Notes (Signed)
RFV: 6 month visit for PrEP  Patient ID: Jeremy Guerrero, male   DOB: 18-Sep-1966, 51 y.o.   MRN: [id number]  HPI [name] is a 51yo M with HTN, OSA, eczema who is also taking PrEP. He takes truvada regularly without difficulty.he states that he has had only had one partner since November. He is starting to be involved in long distance relationship. His partner lives in [place].  Outpatient Encounter Prescriptions as of 12/12/2016  Medication Sig  . dicyclomine (BENTYL) 20 MG tablet Take 1 tablet (20 mg total) by mouth 2 (two) times daily.  Marland Kitchen emtricitabine-tenofovir (TRUVADA) 200-300 MG tablet Take 1 tablet by mouth daily.  Marland Kitchen lisinopril-hydrochlorothiazide (PRINZIDE,ZESTORETIC) 20-25 MG tablet TAKE 1 TABLET BY MOUTH DAILY  . naproxen sodium (ANAPROX) 220 MG tablet Take 660 mg by mouth 2 (two) times daily as needed (pain).  . ondansetron (ZOFRAN) 4 MG tablet Take 1 tablet (4 mg total) by mouth every 6 (six) hours.   No facility-administered encounter medications on file as of 12/12/2016.      Patient Active Problem List   Diagnosis Date Noted  . History of adenomatous polyp of colon 06/02/2016  . High risk sexual behavior 06/02/2016  . Colon cancer screening   . Benign neoplasm of ascending colon   . Hyperglycemia 02/09/2016  . HTN (hypertension) 01/12/2016  . Morbid obesity ([place]) 01/12/2016  . Knee pain, bilateral 03/24/2014  . OSA (obstructive sleep apnea) 10/30/2013  . Eczema 10/28/2013  . BACK PAIN 06/22/2010  . HYPERLIPIDEMIA 06/14/2010  . ABDOMINAL MASS 03/03/2008  . ABDOMINAL PAIN, LOWER 02/28/2008  . SCIATICA, RIGHT 07/23/2007  . FATIGUE 04/26/2007  . LIBIDO, DECREASED 04/26/2007   Social History  Substance Use Topics  . Smoking status: Former Smoker    Packs/day: 1.00    Years: 30.00    Types: Cigarettes    Quit date: 11/27/2013  . Smokeless tobacco: Never Used  . Alcohol use 0.0 oz/week     Comment: 6 beers a month    Health Maintenance Due  Topic Date Due  .  TETANUS/TDAP  07/22/1984  . INFLUENZA VACCINE  04/18/2016     Review of Systems Review of Systems  Constitutional: Negative for fever, chills, diaphoresis, activity change, appetite change, fatigue and unexpected weight change.  HENT: Negative for congestion, sore throat, rhinorrhea, sneezing, trouble swallowing and sinus pressure.  Eyes: Negative for photophobia and visual disturbance.  Respiratory: Negative for cough, chest tightness, shortness of breath, wheezing and stridor.  Cardiovascular: Negative for chest pain, palpitations and leg swelling.  Gastrointestinal: Negative for nausea, vomiting, abdominal pain, diarrhea, constipation, blood in stool, abdominal distention and anal bleeding.  Genitourinary: Negative for dysuria, hematuria, flank pain and difficulty urinating.  Musculoskeletal: Negative for myalgias, back pain, joint swelling, arthralgias and gait problem.  Skin: Negative for color change, pallor, rash and wound.  Neurological: Negative for dizziness, tremors, weakness and light-headedness.  Hematological: Negative for adenopathy. Does not bruise/bleed easily.  Psychiatric/Behavioral: Negative for behavioral problems, confusion, sleep disturbance, dysphoric mood, decreased concentration and agitation.    Physical Exam  BP 128/85   Pulse 78   Temp 98.1 F (36.7 C)   Wt (!) 358 lb (162.4 kg)   SpO2 96%   BMI 49.93 kg/m  Physical Exam  Constitutional: He is oriented to person, place, and time. He appears well-developed and well-nourished. No distress.  HENT:  Mouth/Throat: Oropharynx is clear and moist. No oropharyngeal exudate.  Cardiovascular: Normal rate, regular rhythm and normal heart sounds. Exam reveals  no gallop and no friction rub.  No murmur heard.  Pulmonary/Chest: Effort normal and breath sounds normal. No respiratory distress. He has no wheezes.  Abdominal: Soft. Bowel sounds are normal. He exhibits no distension. There is no tenderness.    Lymphadenopathy:  He has no cervical adenopathy.  Neurological: He is alert and oriented to person, place, and time.  Skin: Skin is warm and dry. No rash noted. No erythema.  Psychiatric: He has a normal mood and affect. His behavior is normal.    Lab Results  Component Value Date   CD4TCELL 49 05/16/2016   Lab Results  Component Value Date   CD4TABS 1,580 05/16/2016   Lab Results  Component Value Date   HIV1RNAQUANT <20 05/16/2016   Lab Results  Component Value Date   HEPBSAB NEG 05/19/2014   Lab Results  Component Value Date   LABRPR NON REAC 11/27/2016    CBC Lab Results  Component Value Date   WBC 10.9 (H) 07/06/2016   RBC 5.57 07/06/2016   HGB 18.5 (H) 07/06/2016   HCT 53.8 (H) 07/06/2016   PLT 204 07/06/2016   MCV 96.6 07/06/2016   MCH 33.2 07/06/2016   MCHC 34.4 07/06/2016   RDW 13.4 07/06/2016   LYMPHSABS 2.7 01/26/2016   MONOABS 0.7 01/26/2016   EOSABS 0.1 01/26/2016    BMET Lab Results  Component Value Date   NA 141 11/27/2016   K 4.4 11/27/2016   CL 101 11/27/2016   CO2 29 11/27/2016   GLUCOSE 78 11/27/2016   BUN 16 11/27/2016   CREATININE 0.93 11/27/2016   CALCIUM 9.5 11/27/2016   GFRNONAA >89 09/14/2016   GFRAA >89 09/14/2016      Assessment and Plan  High risk sex = continue to refill truvada  Sex for risk of sti - has had unprotected sex with one partner since November. Will screen for chlam  Medication monitoring - cr function is stable.  See back in 3 months with pharmacy  rtc in 6 months

## 2016-12-14 LAB — CYTOLOGY, (ORAL, ANAL, URETHRAL) ANCILLARY ONLY
Chlamydia: NEGATIVE
NEISSERIA GONORRHEA: NEGATIVE

## 2017-01-04 MED FILL — TRUVADA 200-300 MG TABS: 200-300 | 30 days supply | Qty: 30 | Fill #0

## 2017-02-01 MED FILL — TRUVADA 200-300 MG TABS: 200-300 | 30 days supply | Qty: 30 | Fill #1

## 2017-02-27 IMAGING — CR DG CHEST 2V
2 series · 2 of 2 positions shown · non-contrast
Comparison: Chest radiograph June 15, 2006

CLINICAL DATA: Intermittent sternal RIGHT chest pain. Nausea,
vomiting and headache today. History of hypertension.

EXAM:
CHEST  2 VIEW

[w chest pa]
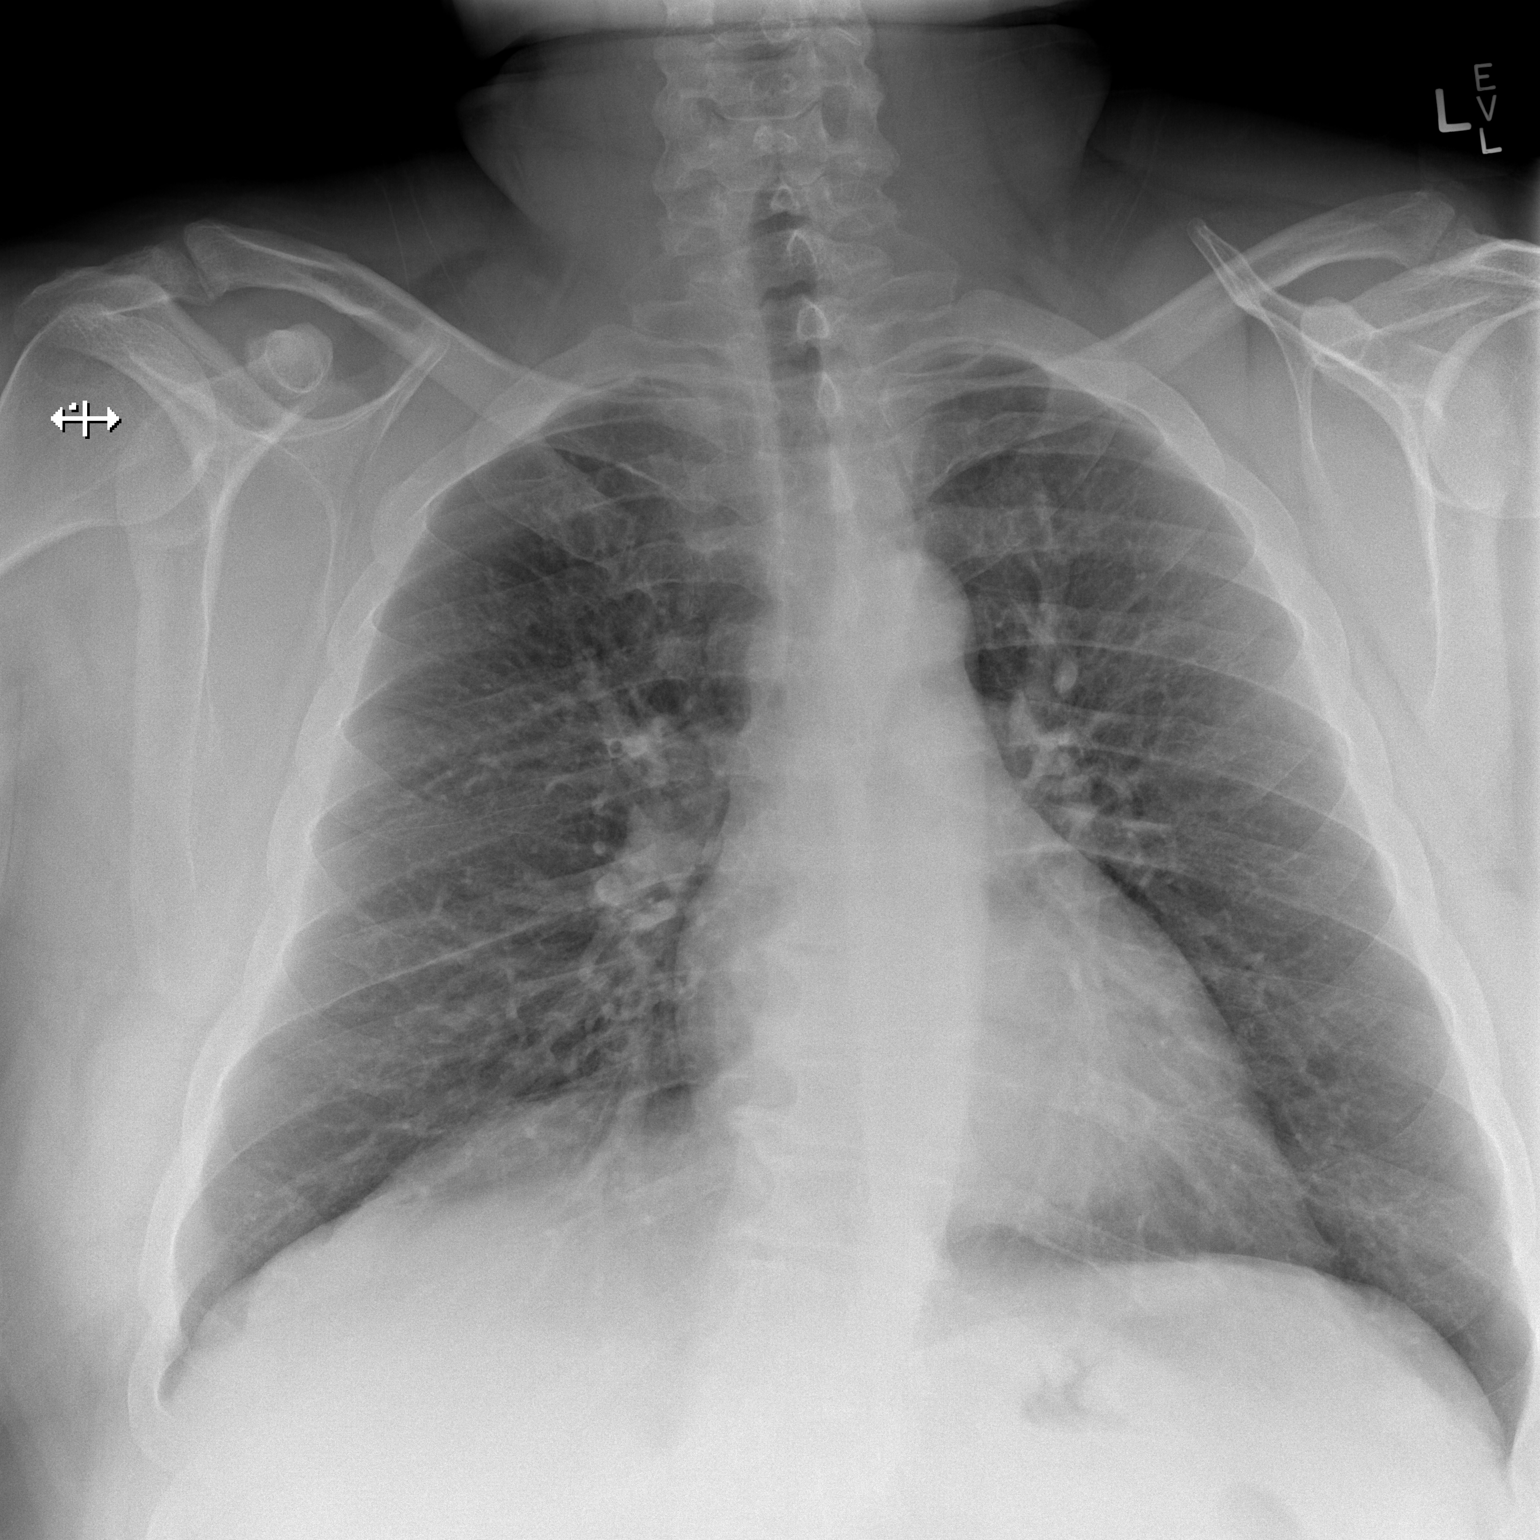

[w chest lat]
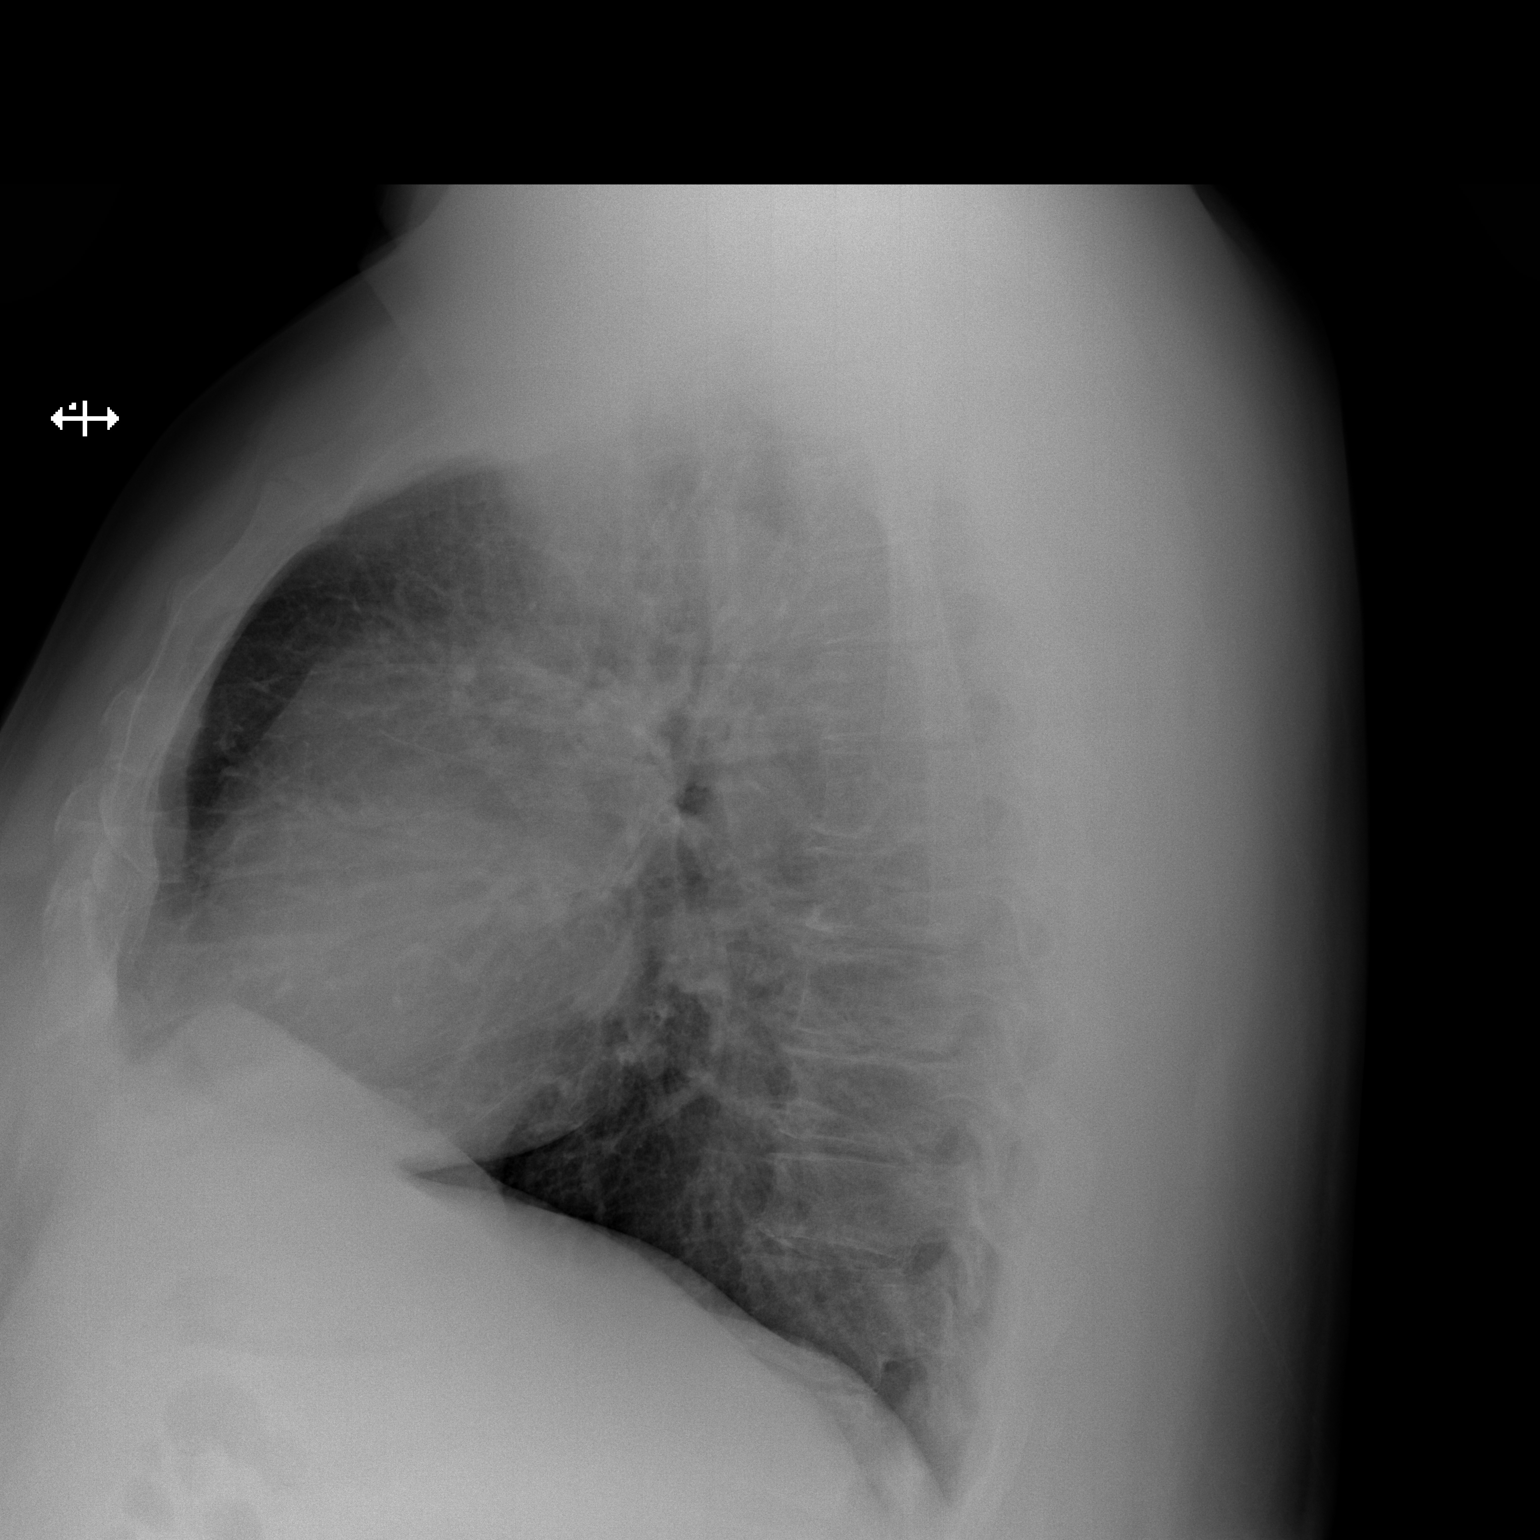

[2 of 2 positions shown; findings below may reference images not displayed]

FINDINGS: Cardiomediastinal silhouette is normal. No pleural effusions or
focal consolidations. Trachea projects midline and there is no
pneumothorax. Soft tissue planes and included osseous structures are
non-suspicious. Large body habitus.
IMPRESSION: No acute cardiopulmonary process.

## 2017-03-07 ENCOUNTER — Ambulatory Visit: Payer: Managed Care, Other (non HMO)

## 2017-03-20 ENCOUNTER — Ambulatory Visit: Payer: Managed Care, Other (non HMO)

## 2017-03-26 ENCOUNTER — Ambulatory Visit (INDEPENDENT_AMBULATORY_CARE_PROVIDER_SITE_OTHER): Payer: Managed Care, Other (non HMO) | Admitting: Pharmacist

## 2017-03-26 ENCOUNTER — Other Ambulatory Visit (HOSPITAL_COMMUNITY)
Admission: RE | Admit: 2017-03-26 | Discharge: 2017-03-26 | Disposition: A | Discharge status: Home / Self Care | Payer: Managed Care, Other (non HMO) | Source: Ambulatory Visit | Attending: Internal Medicine | Admitting: Internal Medicine

## 2017-03-26 DIAGNOSIS — Z7251 High risk heterosexual behavior: Secondary | ICD-10-CM | POA: Insufficient documentation

## 2017-03-26 LAB — BASIC METABOLIC PANEL
BUN: 14 mg/dL (ref 7–25)
CO2: 21 mmol/L (ref 20–31)
Calcium: 9.7 mg/dL (ref 8.6–10.3)
Chloride: 101 mmol/L (ref 98–110)
Creat: 0.88 mg/dL (ref 0.70–1.33)
GLUCOSE: 94 mg/dL (ref 65–99)
POTASSIUM: 4.2 mmol/L (ref 3.5–5.3)
SODIUM: 138 mmol/L (ref 135–146)

## 2017-03-26 MED FILL — TRUVADA 200-300 MG TABS: 200-300 | 30 days supply | Qty: 30 | Fill #2

## 2017-03-26 NOTE — Progress Notes (Signed)
HPI: Jeremy Guerrero is a 51 y.o. male who presents to the [facility] for his 3 month PrEP visit.   Allergies: No Known Allergies  Past Medical History: Past Medical History:  Diagnosis Date  . History of adenomatous polyp of colon 06/02/2016  . Hyperlipidemia   . Hypertension   . Sciatica   . Sleep apnea    wears cpap    Social History: Social History   Social History  . Marital status: Single    Spouse name: N/A  . Number of children: N/A  . Years of education: N/A   Occupational History  . [employer] worker [employer]   Social History Main Topics  . Smoking status: Former Smoker    Packs/day: 1.00    Years: 30.00    Types: Cigarettes    Quit date: 11/27/2013  . Smokeless tobacco: Never Used  . Alcohol use 0.0 oz/week     Comment: 6 beers a month  . Drug use: No  . Sexual activity: Yes    Partners: Male    Birth control/ protection: None   Other Topics Concern  . Not on file   Social History Narrative  . No narrative on file    Current Regimen: Truvada  Labs: HIV 1 RNA Quant (copies/mL)  Date Value  05/16/2016 <20  03/24/2015 <20  06/30/2014 <20   CD4 T Cell Abs (/uL)  Date Value  05/16/2016 1,580   Hep B S Ab (no units)  Date Value  05/19/2014 NEG   Hepatitis B Surface Ag (no units)  Date Value  05/19/2014 NEGATIVE   HCV Ab (no units)  Date Value  05/19/2014 NEGATIVE    CrCl: CrCl cannot be calculated (Patient's most recent lab result is older than the maximum 21 days allowed.).  Lipids:    Component Value Date/Time   CHOL 234 (H) 01/26/2016 0820   TRIG 185.0 (H) 01/26/2016 0820   HDL 34.20 (L) 01/26/2016 0820   CHOLHDL 7 01/26/2016 0820   VLDL 37.0 01/26/2016 0820   LDLCALC 163 (H) 01/26/2016 0820    Assessment: Jeremy Guerrero is here today to follow-up with [place] for his 3 month PrEP appointment.  He has been on Truvada for years.  He is having no issues tolerating or taking the Truvada and no missed doses since seeing Dr.  Baxter Flattery in March.  He has had 3 partners in the last 3 months, all unprotected. No oral sex. He is the insertive partner each time.  He does tell me that one of his partners called him last Monday and told him he tested positive for chlamydia.  He thinks he got it from a partner that was after Butch, but we will check all sites today just in case.  He picked up a Truvada refill this morning from [facility]. He isn't having any issues with [facility] except that he has trouble getting to the pharmacy within business hours as he works in [place] until [place] him about mailing it and he thinks he may start that process in the future. Will check STIs today and HIV antibody.  Will send in refills if negative.  Plans: - Oral, rectal, urine G/C, RPR - HIV antibody and BMET today - Will send in refills if negative  Aleks Nawrot L. Lyndee Herbst, PharmD, [facility] for Infectious Disease 03/26/2017, 9:40 AM

## 2017-03-27 LAB — CYTOLOGY, (ORAL, ANAL, URETHRAL) ANCILLARY ONLY
CHLAMYDIA, DNA PROBE: NEGATIVE
CHLAMYDIA, DNA PROBE: NEGATIVE
NEISSERIA GONORRHEA: NEGATIVE
NEISSERIA GONORRHEA: NEGATIVE

## 2017-03-27 LAB — HIV ANTIBODY (ROUTINE TESTING W REFLEX): HIV 1&2 Ab, 4th Generation: NONREACTIVE

## 2017-03-27 LAB — RPR

## 2017-03-27 LAB — URINE CYTOLOGY ANCILLARY ONLY
Chlamydia: NEGATIVE
Neisseria Gonorrhea: NEGATIVE

## 2017-03-29 ENCOUNTER — Telehealth: Payer: Self-pay | Admitting: Pharmacist

## 2017-03-29 ENCOUNTER — Other Ambulatory Visit: Payer: Self-pay | Admitting: Pharmacist

## 2017-03-29 DIAGNOSIS — Z7251 High risk heterosexual behavior: Secondary | ICD-10-CM

## 2017-03-29 MED ORDER — EMTRICITABINE-TENOFOVIR DF 200-300 MG PO TABS: 1.0000 | ORAL_TABLET | Freq: Every day | ORAL | 2 refills | Status: DC

## 2017-03-29 NOTE — Telephone Encounter (Signed)
Called [facility] and left generic VM that I sent in refills for his Truvada.

## 2017-04-19 MED FILL — TRUVADA 200-300 MG TABS: 200-300 | 30 days supply | Qty: 30 | Fill #0

## 2017-04-23 ENCOUNTER — Telehealth: Payer: Self-pay | Admitting: Family Medicine

## 2017-04-23 NOTE — Telephone Encounter (Signed)
Pt is calling stating that [facility] is in need of a new doctors order stating that the pt is using the CPAP machine so that they can continue to send him out his equipment.  Pt is aware he may need to contact his pulmonologist for this information.

## 2017-04-23 NOTE — Telephone Encounter (Signed)
Pt needs to contact pulmonary.

## 2017-04-23 NOTE — Telephone Encounter (Signed)
Pt is aware that he must call the pulmonary area for this and verbalized understanding of this information.

## 2017-05-18 ENCOUNTER — Other Ambulatory Visit: Payer: Self-pay | Admitting: Family Medicine

## 2017-06-01 ENCOUNTER — Telehealth: Payer: Self-pay | Admitting: Pharmacist

## 2017-06-01 ENCOUNTER — Other Ambulatory Visit: Payer: Self-pay | Admitting: Pharmacist

## 2017-06-01 NOTE — Telephone Encounter (Signed)
Patient's insurance ran out and the new one does not go active until October 1. Patient has maybe a week left of Truvada.  Will try and bring him in next week to do patient access through [place].

## 2017-06-04 ENCOUNTER — Other Ambulatory Visit: Payer: Self-pay | Admitting: Pharmacist

## 2017-06-27 ENCOUNTER — Ambulatory Visit: Payer: Managed Care, Other (non HMO)

## 2017-07-04 ENCOUNTER — Ambulatory Visit (INDEPENDENT_AMBULATORY_CARE_PROVIDER_SITE_OTHER): Payer: 59 | Admitting: Pharmacist

## 2017-07-04 DIAGNOSIS — Z7252 High risk homosexual behavior: Secondary | ICD-10-CM | POA: Diagnosis not present

## 2017-07-04 NOTE — Progress Notes (Signed)
HPI: Jeremy Guerrero is a 51 y.o. male who presents to the [place] clinic today for his 3 month PrEP follow-up.  Allergies: No Known Allergies  Past Medical History: Past Medical History:  Diagnosis Date  . History of adenomatous polyp of colon 06/02/2016  . Hyperlipidemia   . Hypertension   . Sciatica   . Sleep apnea    wears cpap    Social History: Social History   Social History  . Marital status: Single    Spouse name: N/A  . Number of children: N/A  . Years of education: N/A   Occupational History  . [employer]   Social History Main Topics  . Smoking status: Former Smoker    Packs/day: 1.00    Years: 30.00    Types: Cigarettes    Quit date: 11/27/2013  . Smokeless tobacco: Never Used  . Alcohol use 0.0 oz/week     Comment: 6 beers a month  . Drug use: No  . Sexual activity: Yes    Partners: Male    Birth control/ protection: None   Other Topics Concern  . Not on file   Social History Narrative  . No narrative on file    Current Regimen: Truvada  Labs: HIV 1 RNA Quant (copies/mL)  Date Value  05/16/2016 <20  03/24/2015 <20  06/30/2014 <20   CD4 T Cell Abs (/uL)  Date Value  05/16/2016 1,580   Hep B S Ab (no units)  Date Value  05/19/2014 NEG   Hepatitis B Surface Ag (no units)  Date Value  05/19/2014 NEGATIVE   HCV Ab (no units)  Date Value  05/19/2014 NEGATIVE    CrCl: CrCl cannot be calculated (Patient's most recent lab result is older than the maximum 21 days allowed.).  Lipids:    Component Value Date/Time   CHOL 234 (H) 01/26/2016 0820   TRIG 185.0 (H) 01/26/2016 0820   HDL 34.20 (L) 01/26/2016 0820   CHOLHDL 7 01/26/2016 0820   VLDL 37.0 01/26/2016 0820   LDLCALC 163 (H) 01/26/2016 0820    Assessment: Jeremy Guerrero is here today to follow-up for PrEP.  He ran out of Truvada ~2 weeks ago. He has been going back and forth to [place] since the beginning of August due to his father becoming ill and  passing away.  Since last visit, he has had zero partners - no sexual encounters at all including oral sex.  He states he hasn't even had time for himself.  He was tolerating Truvada well prior to running out.  He just switched jobs and got new insurance.  We received the insurance and will process his prescription. I will hold off on STI testing today as he has had no exposure and was tested back in July.  His kidneys were also checked back in July as well.  Will only do a HIV antibody today and bring him back in 3 months.    Plans: - Continue Truvada if HIV negative - HIV antibody - F/u with me again 10/04/17 at [place]. Yogi Arther, PharmD, [place] for Infectious Disease 07/04/2017, 3:12 PM

## 2017-07-05 ENCOUNTER — Telehealth: Payer: Self-pay | Admitting: Pharmacist

## 2017-07-05 ENCOUNTER — Other Ambulatory Visit: Payer: Self-pay | Admitting: Pharmacist

## 2017-07-05 DIAGNOSIS — Z7252 High risk homosexual behavior: Secondary | ICD-10-CM

## 2017-07-05 LAB — HIV ANTIBODY (ROUTINE TESTING W REFLEX): HIV 1&2 Ab, 4th Generation: NONREACTIVE

## 2017-07-05 MED ORDER — EMTRICITABINE-TENOFOVIR DF 200-300 MG PO TABS: 1.0000 | ORAL_TABLET | Freq: Every day | ORAL | 2 refills | Status: DC

## 2017-07-05 NOTE — Telephone Encounter (Signed)
Called Jeremy Guerrero to let him know that his results were negative from yesterday. He has new insurance that requires a different pharmacy, so he won't be getting but one more refill from [facility]. Patient aware.

## 2017-07-06 MED FILL — TRUVADA 200-300 MG TABS: 200-300 | 30 days supply | Qty: 30 | Fill #0

## 2017-07-10 ENCOUNTER — Other Ambulatory Visit: Payer: Self-pay | Admitting: Pharmacist

## 2017-07-26 ENCOUNTER — Telehealth: Payer: Self-pay | Admitting: Pharmacist

## 2017-07-26 NOTE — Telephone Encounter (Signed)
Jeremy Guerrero got new insurance and it requires him to get his Truvada through their specialty pharmacy [facility].  Received fax from [facility] stating that they have been unable to get in touch with patient to mail Truvada. Tried to call Jeremy Guerrero and got his voicemail.  Left generic message for him to call me back to sort out the issue.

## 2017-07-31 ENCOUNTER — Telehealth: Payer: Self-pay | Admitting: Pharmacist Clinician (PhC)/ Clinical Pharmacy Specialist

## 2017-07-31 NOTE — Telephone Encounter (Signed)
Butch had and issue with communication with the new specialty pharmacy that he has to use now with the new insurance. They were playing phone tag but he has resolved the issue with them now.

## 2017-09-25 ENCOUNTER — Other Ambulatory Visit: Payer: Self-pay | Admitting: Pharmacist

## 2017-09-25 DIAGNOSIS — Z7252 High risk homosexual behavior: Secondary | ICD-10-CM

## 2017-10-04 ENCOUNTER — Ambulatory Visit: Payer: 59

## 2017-10-10 ENCOUNTER — Ambulatory Visit (INDEPENDENT_AMBULATORY_CARE_PROVIDER_SITE_OTHER): Payer: 59 | Admitting: Pharmacist

## 2017-10-10 ENCOUNTER — Other Ambulatory Visit (HOSPITAL_COMMUNITY)
Admission: RE | Admit: 2017-10-10 | Discharge: 2017-10-10 | Disposition: A | Discharge status: Home / Self Care | Payer: 59 | Source: Ambulatory Visit | Attending: Internal Medicine | Admitting: Internal Medicine

## 2017-10-10 DIAGNOSIS — Z7252 High risk homosexual behavior: Secondary | ICD-10-CM | POA: Diagnosis not present

## 2017-10-10 NOTE — Progress Notes (Signed)
[place] for Infectious Disease Pharmacy Visit  HPI: Jeremy Guerrero is a 52 y.o. male who presents to the [place] clinic for his 3 month PrEP follow-up.  Patient Active Problem List   Diagnosis Date Noted  . History of adenomatous polyp of colon 06/02/2016  . High risk sexual behavior 06/02/2016  . Colon cancer screening   . Benign neoplasm of ascending colon   . Hyperglycemia 02/09/2016  . HTN (hypertension) 01/12/2016  . Morbid obesity ([place]) 01/12/2016  . Knee pain, bilateral 03/24/2014  . OSA (obstructive sleep apnea) 10/30/2013  . Eczema 10/28/2013  . BACK PAIN 06/22/2010  . HYPERLIPIDEMIA 06/14/2010  . ABDOMINAL MASS 03/03/2008  . ABDOMINAL PAIN, LOWER 02/28/2008  . SCIATICA, RIGHT 07/23/2007  . FATIGUE 04/26/2007  . LIBIDO, DECREASED 04/26/2007    Patient's Medications  New Prescriptions   No medications on file  Previous Medications   DICYCLOMINE (BENTYL) 20 MG TABLET    Take 1 tablet (20 mg total) by mouth 2 (two) times daily.   EMTRICITABINE-TENOFOVIR (TRUVADA) 200-300 MG TABLET    Take 1 tablet by mouth daily.   LISINOPRIL-HYDROCHLOROTHIAZIDE (PRINZIDE,ZESTORETIC) 20-25 MG TABLET    Take 1 tablet by mouth daily.   NAPROXEN SODIUM (ANAPROX) 220 MG TABLET    Take 660 mg by mouth 2 (two) times daily as needed (pain).   ONDANSETRON (ZOFRAN) 4 MG TABLET    Take 1 tablet (4 mg total) by mouth every 6 (six) hours.  Modified Medications   No medications on file  Discontinued Medications   No medications on file    Allergies: No Known Allergies  Past Medical History: Past Medical History:  Diagnosis Date  . History of adenomatous polyp of colon 06/02/2016  . Hyperlipidemia   . Hypertension   . Sciatica   . Sleep apnea    wears cpap    Social History: Social History   Socioeconomic History  . Marital status: Single    Spouse name: Not on file  . Number of children: Not on file  . Years of education: Not on file  . Highest education level: Not  on file  Social Needs  . Financial resource strain: Not on file  . Food insecurity - worry: Not on file  . Food insecurity - inability: Not on file  . Transportation needs - medical: Not on file  . Transportation needs - non-medical: Not on file  Occupational History  . Occupation: Best boy: [employer]  Tobacco Use  . Smoking status: Former Smoker    Packs/day: 1.00    Years: 30.00    Pack years: 30.00    Types: Cigarettes    Last attempt to quit: 11/27/2013    Years since quitting: 3.8  . Smokeless tobacco: Never Used  Substance and Sexual Activity  . Alcohol use: Yes    Alcohol/week: 0.0 oz    Comment: 6 beers a month  . Drug use: No  . Sexual activity: Yes    Partners: Male    Birth control/protection: None  Other Topics Concern  . Not on file  Social History Narrative  . Not on file    Labs: HIV 1 RNA Quant (copies/mL)  Date Value  05/16/2016 <20  03/24/2015 <20  06/30/2014 <20   CD4 T Cell Abs (/uL)  Date Value  05/16/2016 1,580   Hep B S Ab (no units)  Date Value  05/19/2014 NEG   Hepatitis B Surface Ag (no units)  Date Value  05/19/2014 NEGATIVE   HCV Ab (no units)  Date Value  05/19/2014 NEGATIVE    HIV Pre-Exposure Prophylaxis (PrEP): Patient's risk for HIV: MSM Sexual partner preference: male Number of sexual partners in the last 6 months: 1 Condom use? no Sexual activity preference: insertive Symptoms of acute HIV? none Last date of BMET: 03/26/17 Last date of STI testing: 03/26/17 Last negative HIV antibody test: 07/04/17  Assessment: Jeremy Guerrero is here today to follow-up for PrEP. He has been on Truvada for quite some time.  He is doing well on the Truvada with no missed doses and no side effects or issues. He has around 7 pills left in his last bottle. He is not having trouble getting refills or anything from the specialty pharmacy.  He has had one partner since I last saw him in October. He was the insertive partner  with no condom use.  He knows the risk of STIs with no condom use but I went over the risk and importance of condom use again. It is time to recheck his kidney function, as well as STIs.  I will only get a urine g/c screen since he is only the insertive partner with no anal involvement and no oral either.  I will see him again in 3 months.  Plan: - Truvada x 3 more months if HIV negative - HIV antibody, RPR, BMET, urine g/c today - F/u with me again 4/24 at 330pm  Cassie L. Kuppelweiser, PharmD, [place], [place] for Infectious Disease 10/10/2017, 3:44 PM

## 2017-10-11 ENCOUNTER — Telehealth: Payer: Self-pay | Admitting: Pharmacist

## 2017-10-11 ENCOUNTER — Other Ambulatory Visit: Payer: Self-pay | Admitting: Pharmacist

## 2017-10-11 DIAGNOSIS — Z7252 High risk homosexual behavior: Secondary | ICD-10-CM

## 2017-10-11 LAB — SPECIMEN COMPROMISED

## 2017-10-11 LAB — BASIC METABOLIC PANEL
BUN: 13 mg/dL (ref 7–25)
CO2: 28 mmol/L (ref 20–32)
Calcium: 9.3 mg/dL (ref 8.6–10.3)
Chloride: 106 mmol/L (ref 98–110)
Creat: 0.93 mg/dL (ref 0.70–1.33)
Glucose, Bld: 100 mg/dL — ABNORMAL HIGH (ref 65–99)
Potassium: 4.4 mmol/L (ref 3.5–5.3)
Sodium: 141 mmol/L (ref 135–146)

## 2017-10-11 LAB — HIV ANTIBODY (ROUTINE TESTING W REFLEX): HIV: NONREACTIVE

## 2017-10-11 LAB — RPR: RPR Ser Ql: NONREACTIVE

## 2017-10-11 MED ORDER — EMTRICITABINE-TENOFOVIR DF 200-300 MG PO TABS: 1.0000 | ORAL_TABLET | Freq: Every day | ORAL | 2 refills | Status: DC

## 2017-10-11 NOTE — Telephone Encounter (Signed)
Called Jeremy Guerrero to let him know that his HIV antibody from yesterday was negative.  Will send in 3 more months of Truvada.  He will follow-up with me again in April.

## 2017-10-12 LAB — URINE CYTOLOGY ANCILLARY ONLY
CHLAMYDIA, DNA PROBE: NEGATIVE
Neisseria Gonorrhea: NEGATIVE

## 2017-10-24 ENCOUNTER — Telehealth: Payer: Self-pay | Admitting: Pharmacist

## 2017-10-24 ENCOUNTER — Other Ambulatory Visit: Payer: Self-pay | Admitting: Pharmacist

## 2017-10-24 DIAGNOSIS — Z7252 High risk homosexual behavior: Secondary | ICD-10-CM

## 2017-10-24 MED ORDER — EMTRICITABINE-TENOFOVIR DF 200-300 MG PO TABS: 1.0000 | ORAL_TABLET | Freq: Every day | ORAL | 2 refills | Status: DC

## 2017-10-24 NOTE — Telephone Encounter (Signed)
Jeremy Guerrero called and is having trouble with his insurance. He recently got new insurance at the end of 2018 which required him to fill his Truvada through another specialty pharmacy (he was getting it at [facility]).   He told me his company messed up the new insurance cards for this year and have not gotten new ones out to them yet.  They told him he could use his old insurance but when he tried to fill it with his old insurance, it was inactive.  He called his insurance company and they stated it was fixed and he could get it filled anywhere.  He then called me to send it back to [facility] as he really likes that pharmacy and wishes to fill it there permanently.   [name] tried to run it through the pharmacy system, but it still showed inactive.  She called [employer] and they couldn't find any active insurance either.  I called Jeremy Guerrero back and let him know the information.  Waiting to see how I can further help him.

## 2017-11-19 ENCOUNTER — Other Ambulatory Visit: Payer: Self-pay | Admitting: Pharmacist

## 2018-01-09 ENCOUNTER — Ambulatory Visit: Payer: 59

## 2018-01-11 ENCOUNTER — Other Ambulatory Visit: Payer: Self-pay | Admitting: Pharmacist

## 2018-01-11 DIAGNOSIS — Z7252 High risk homosexual behavior: Secondary | ICD-10-CM

## 2018-01-14 ENCOUNTER — Other Ambulatory Visit (HOSPITAL_COMMUNITY)
Admission: RE | Admit: 2018-01-14 | Discharge: 2018-01-14 | Disposition: A | Discharge status: Home / Self Care | Payer: 59 | Source: Ambulatory Visit | Attending: Internal Medicine | Admitting: Internal Medicine

## 2018-01-14 ENCOUNTER — Ambulatory Visit (INDEPENDENT_AMBULATORY_CARE_PROVIDER_SITE_OTHER): Payer: 59 | Admitting: Pharmacist Clinician (PhC)/ Clinical Pharmacy Specialist

## 2018-01-14 DIAGNOSIS — Z7252 High risk homosexual behavior: Secondary | ICD-10-CM

## 2018-01-14 NOTE — Progress Notes (Signed)
Date:  01/14/2018   Insured   [x]    Uninsured  []    HPI  Jeremy Guerrero is a 52 y.o. male is here for his routine PrEP visit.   Demographics Race:  White or Caucasian [1] Marital Status:  Single  Allergies No Known Allergies  Past Medical History:  Diagnosis Date  . History of adenomatous polyp of colon 06/02/2016  . Hyperlipidemia   . Hypertension   . Sciatica   . Sleep apnea    wears cpap    Outpatient Encounter Medications as of 01/14/2018  Medication Sig  . emtricitabine-tenofovir (TRUVADA) 200-300 MG tablet Take 1 tablet by mouth daily.  Marland Kitchen lisinopril-hydrochlorothiazide (PRINZIDE,ZESTORETIC) 20-25 MG tablet Take 1 tablet by mouth daily.  . [DISCONTINUED] dicyclomine (BENTYL) 20 MG tablet Take 1 tablet (20 mg total) by mouth 2 (two) times daily. (Patient not taking: Reported on 12/12/2016)  . [DISCONTINUED] naproxen sodium (ANAPROX) 220 MG tablet Take 660 mg by mouth 2 (two) times daily as needed (pain).  . [DISCONTINUED] ondansetron (ZOFRAN) 4 MG tablet Take 1 tablet (4 mg total) by mouth every 6 (six) hours.   No facility-administered encounter medications on file as of 01/14/2018.     Social History   Tobacco Use  Smoking Status Former Smoker  . Packs/day: 1.00  . Years: 30.00  . Pack years: 30.00  . Types: Cigarettes  . Last attempt to quit: 11/27/2013  . Years since quitting: 4.1  Smokeless Tobacco Never Used   Social History   Substance and Sexual Activity  Alcohol Use Yes  . Alcohol/week: 0.0 oz   Comment: 6 beers a month    Drug use?   Yes []  No [x]   Injectable drug use?   Yes []     No [x]   Sexual History  Missing doses? Yes []    No  [x]   CHL HIV PREP FLOWSHEET RESULTS 01/14/2018 10/10/2017  Insurance Status Insured Insured  How did you hear? Doctor referral -  Gender at birth Male Male  Gender identity cis-Male -  Risk for HIV Condomless vaginal or anal intercourse;Hx of STI Condomless vaginal or anal intercourse;Hx of STI  Sex Partners  Men only Men only  # sex partners past 3-6 mos 1-3 1-3  Sex activity preferences Insertive Insertive  Condom use No No  Partners genders and ages 53 57-49 M 28+  Treated for STI? No No  HIV symptoms? N/A N/A  PrEP Eligibility HIV negative;Substantial risk for HIV -  Paper work received? No No    Labs: Creatinine Lab Results  Component Value Date   CREATININE 0.93 10/10/2017   CREATININE 0.88 03/26/2017   CREATININE 0.93 11/27/2016   CREATININE 0.81 09/14/2016   CREATININE 0.97 07/06/2016    HIV Lab Results  Component Value Date   HIV NON-REACTIVE 10/10/2017   HIV NON-REACTIVE 07/04/2017   HIV NONREACTIVE 03/26/2017   HIV NONREACTIVE 11/27/2016   HIV NONREACTIVE 09/14/2016    GFR CrCl cannot be calculated (Patient's most recent lab result is older than the maximum 21 days allowed.).  Hepatitis B Lab Results  Component Value Date   HEPBSAB NEG 05/19/2014   Lab Results  Component Value Date   HEPBSAG NEGATIVE 05/19/2014    Hepatitis C Lab Results  Component Value Date   HCVAB NEGATIVE 05/19/2014    Hepatitis A No results found for: HAV  RPR and STI Lab Results  Component Value Date   LABRPR NON-REACTIVE 10/10/2017   LABRPR NON REAC 03/26/2017   LABRPR NON  REAC 11/27/2016   LABRPR NON REAC 05/16/2016   LABRPR NON REAC 03/24/2015    STI Results GC CT  10/10/2017 Negative Negative  03/26/2017 Negative Negative  03/26/2017 Negative Negative  03/26/2017 Negative Negative  12/12/2016 Negative Negative  11/27/2016 Negative Negative  05/30/2016 *Negative *Negative  05/16/2016 Negative Negative  03/24/2015 Negative Negative     Assessment  Jeremy Guerrero has done very well on his Truvada for PrEP. He hasn't missed any doses and without side effects. He never use condoms. He has had sexual encounters with 1 partner and he is the top person. Will do minimal testing at this visit. We will do the full STDs one at the 6 months visit. We will call him tomorrow if his HIV ab is  neg for 3 months of TRV.    Recommendations   HIV, GC today If neg, 3 mo of TRV  Onnie Boer, PharmD, BCPS, AAHIVP, CPP Infectious Disease Pharmacist Pager: [phone] 01/14/2018 4:48 PM

## 2018-01-15 ENCOUNTER — Telehealth: Payer: Self-pay | Admitting: Pharmacist Clinician (PhC)/ Clinical Pharmacy Specialist

## 2018-01-15 LAB — HEPATITIS A ANTIBODY, TOTAL: HEPATITIS A AB,TOTAL: NONREACTIVE

## 2018-01-15 LAB — URINE CYTOLOGY ANCILLARY ONLY
CHLAMYDIA, DNA PROBE: NEGATIVE
NEISSERIA GONORRHEA: NEGATIVE

## 2018-01-15 LAB — HIV ANTIBODY (ROUTINE TESTING W REFLEX): HIV: NONREACTIVE

## 2018-01-15 LAB — HEPATITIS B SURFACE ANTIBODY,QUALITATIVE: HEP B S AB: NONREACTIVE

## 2018-01-15 MED ORDER — EMTRICITABINE-TENOFOVIR DF 200-300 MG PO TABS: 1.0000 | ORAL_TABLET | Freq: Every day | ORAL | 2 refills | Status: DC

## 2018-01-15 NOTE — Telephone Encounter (Signed)
HIV neg so send 3 mo of TRV to [facility]. Left a generic VM.

## 2018-01-28 ENCOUNTER — Other Ambulatory Visit: Payer: Self-pay | Admitting: Internal Medicine

## 2018-02-16 ENCOUNTER — Other Ambulatory Visit: Payer: Self-pay | Admitting: Family Medicine

## 2018-02-25 ENCOUNTER — Ambulatory Visit: Payer: 59 | Admitting: Family Medicine

## 2018-03-04 ENCOUNTER — Ambulatory Visit: Payer: 59 | Admitting: Family Medicine

## 2018-03-04 ENCOUNTER — Encounter: Payer: Self-pay | Admitting: Family Medicine

## 2018-03-04 VITALS — BP 124/90 | HR 78 | Temp 98.4°F | Ht 71.0 in | Wt 366.8 lb

## 2018-03-04 DIAGNOSIS — M7661 Achilles tendinitis, right leg: Secondary | ICD-10-CM

## 2018-03-04 DIAGNOSIS — I10 Essential (primary) hypertension: Secondary | ICD-10-CM

## 2018-03-04 MED ORDER — LISINOPRIL-HYDROCHLOROTHIAZIDE 20-25 MG PO TABS: 1.0000 | ORAL_TABLET | Freq: Every day | ORAL | 3 refills | Status: DC

## 2018-03-04 MED ORDER — METHYLPREDNISOLONE 4 MG PO TBPK: ORAL_TABLET | ORAL | 0 refills | Status: DC

## 2018-03-04 MED FILL — LISINOPRIL-HCTZ 20-25 MG TA: 20-25 | 90 days supply | Qty: 90 | Fill #0

## 2018-03-04 MED FILL — METHYLPREDNISOLONE 4 MG TAB: 4 | 6 days supply | Qty: 21 | Fill #0

## 2018-03-04 NOTE — Progress Notes (Signed)
   Subjective:    Patient ID: Jeremy Guerrero, male    DOB: 1965-10-13, 52 y.o.   MRN: [id number]  HPI Here to follow up on HTN and to check his right foot. His BP at home has been stable in the rnage of 120s over 80s. His weight is stable. He has had swelling and pain in the back of the right heel for 2 weeks. This became much worse last weekend when he was sightseeing in [place], [place]. He spent all day walking around and he has been in pain ever since. Taking Aleve.    Review of Systems  Constitutional: Negative.   Respiratory: Negative.   Cardiovascular: Negative.   Musculoskeletal: Positive for arthralgias.       Objective:   Physical Exam  Constitutional: He is oriented to person, place, and time. He appears well-developed and well-nourished.  Cardiovascular: Normal rate, regular rhythm, normal heart sounds and intact distal pulses.  Pulmonary/Chest: Effort normal and breath sounds normal. No stridor. No respiratory distress. He has no wheezes. He has no rales.  Musculoskeletal:  The posterior right heel area is swollen, red, and very tender. Not warm.   Neurological: He is alert and oriented to person, place, and time.          Assessment & Plan:  His HTN is stable. He had a normal BMET in January. We will refill the Lisinopril HCT. He also has Achilles tendonitis. He is to stay off his feet as much as possible for several weeks. Use ice baths bid and wear heel supports in his shoes. Given a Medrol dose pack. Recheck prn.  Alysia Penna, MD

## 2018-04-05 ENCOUNTER — Other Ambulatory Visit: Payer: Self-pay | Admitting: Internal Medicine

## 2018-04-18 IMAGING — US US ABDOMEN LIMITED
1 series · 14 of 25 positions shown · non-contrast
Comparison: CT of the abdomen and pelvis from 06/10/2008

CLINICAL DATA: Acute onset of right upper quadrant abdominal pain.
Nausea and vomiting. Initial encounter.

EXAM:
US ABDOMEN LIMITED - RIGHT UPPER QUADRANT

[Series 1: us abdomen limited · 0.30mm/px · 14 of 49 slices shown]
[im 1/49]
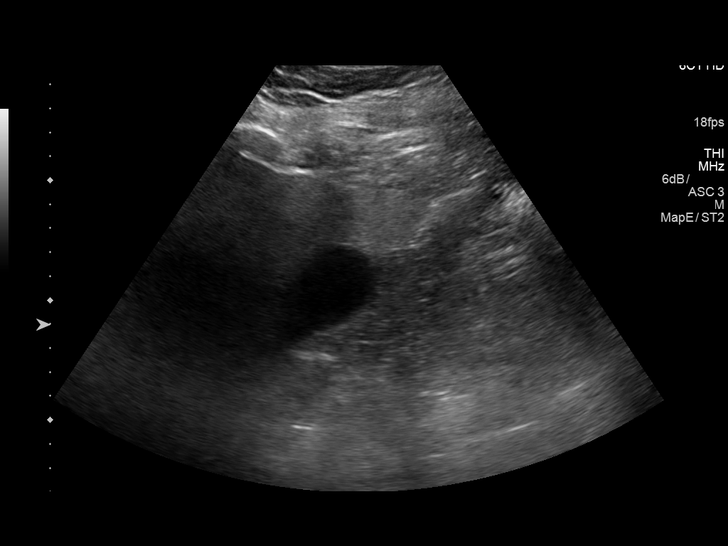
[im 5/49]
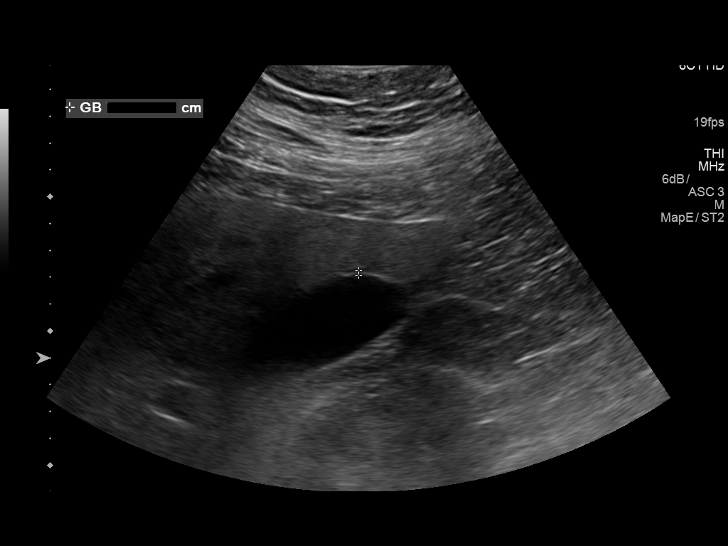
[im 9/49]
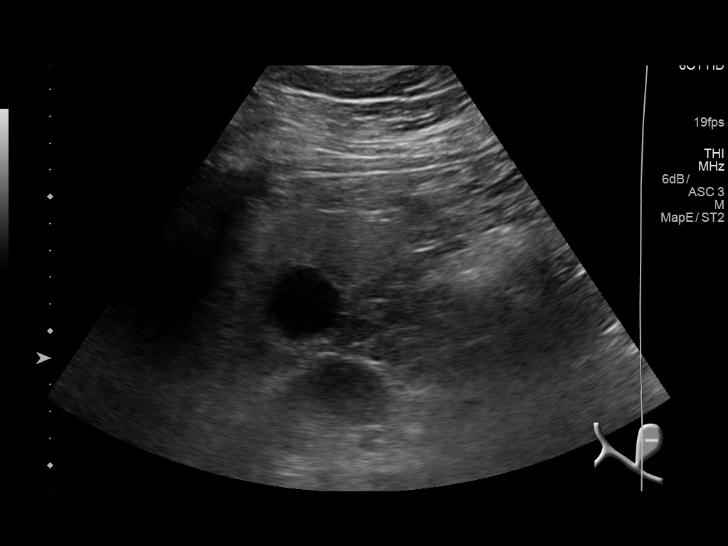
[im 13/49]
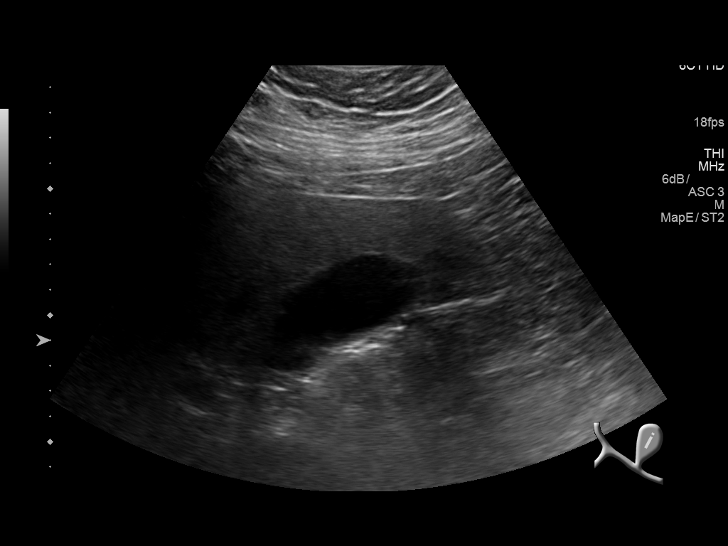
[im 17/49]
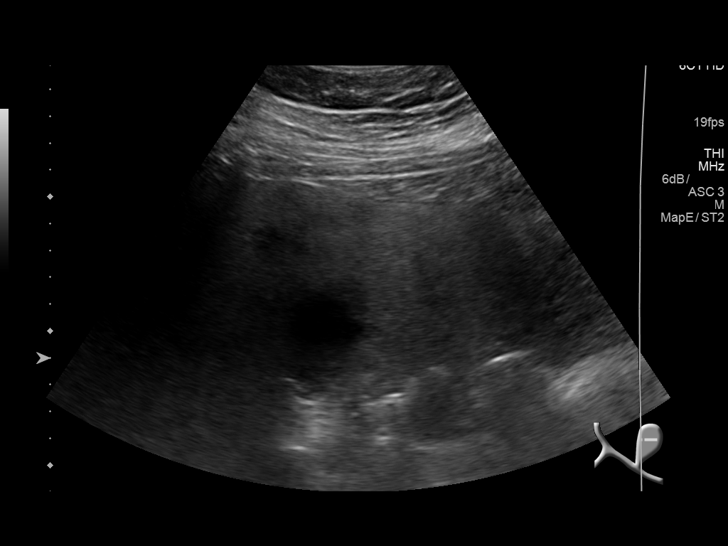
[im 19/49]
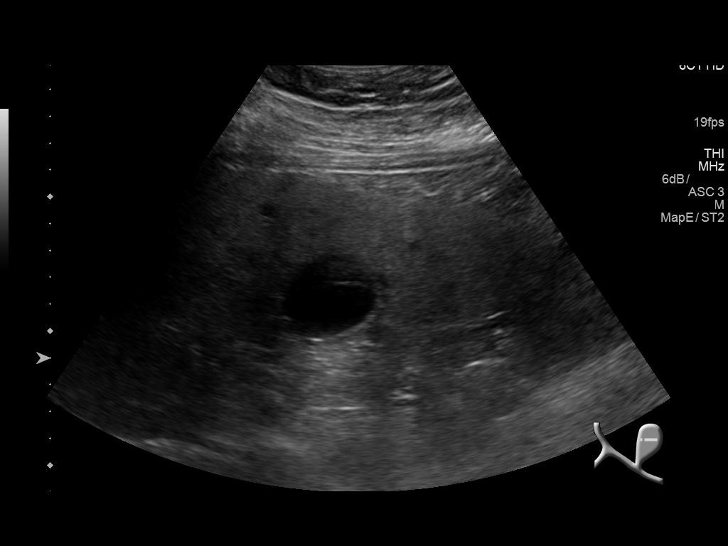
[im 23/49]
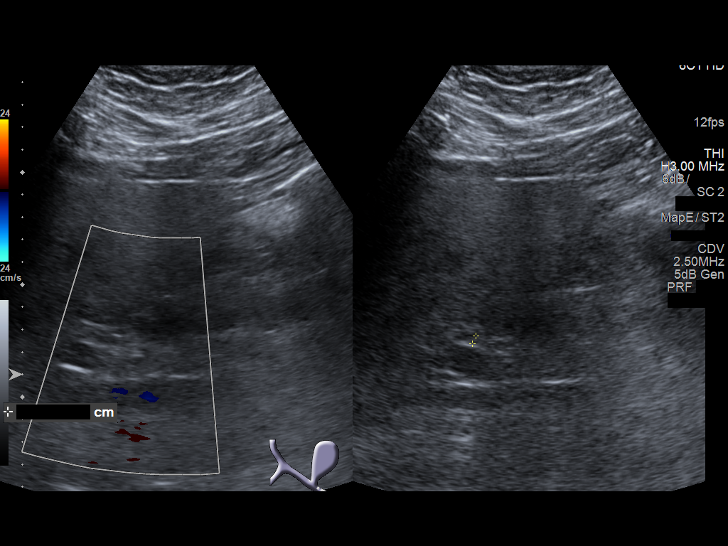
[im 27/49]
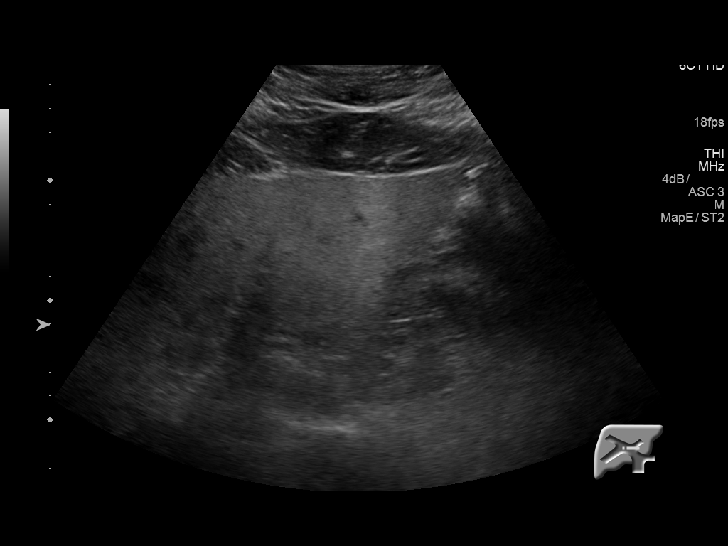
[im 31/49]
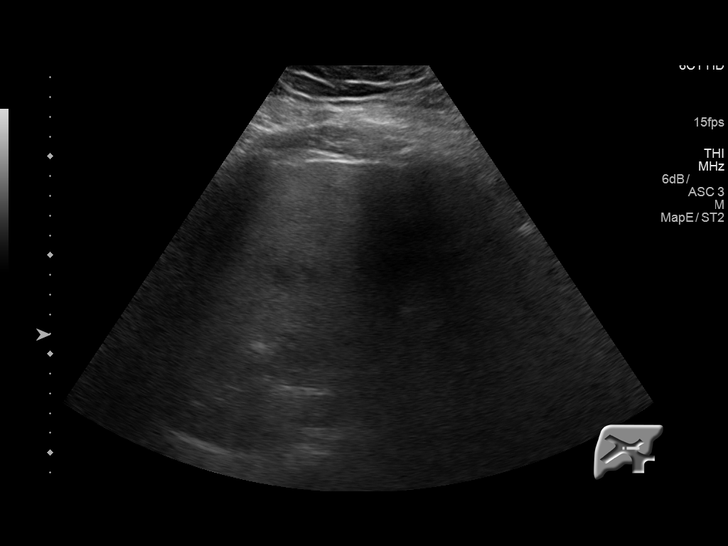
[im 33/49]
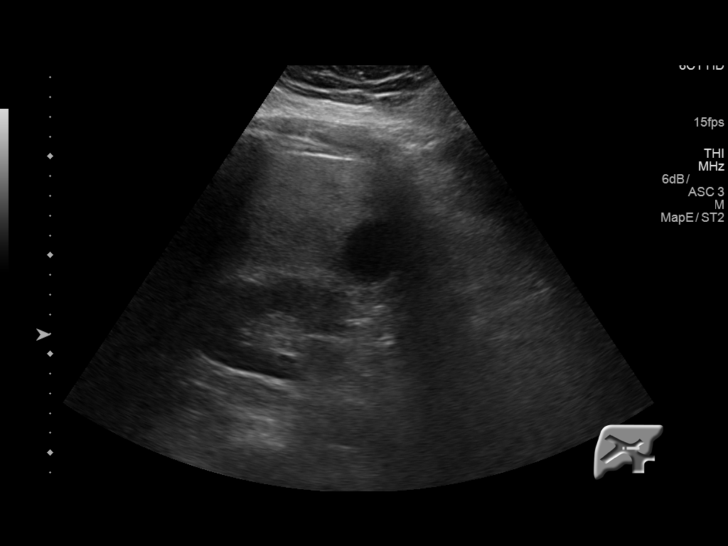
[im 37/49]
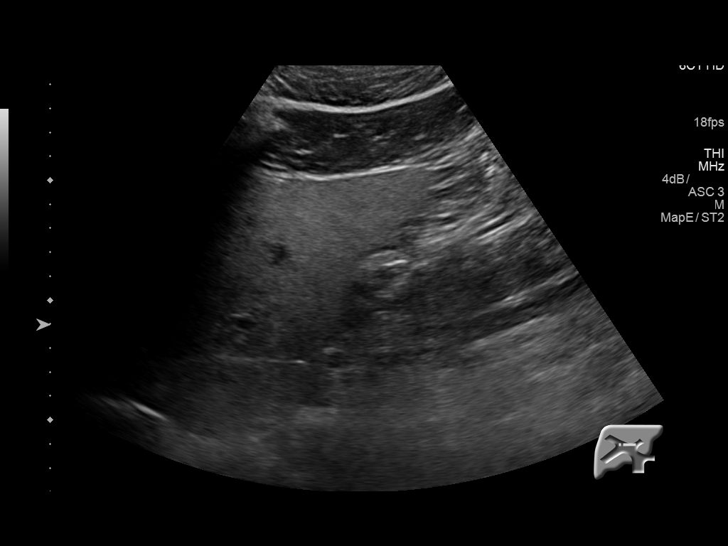
[im 41/49]
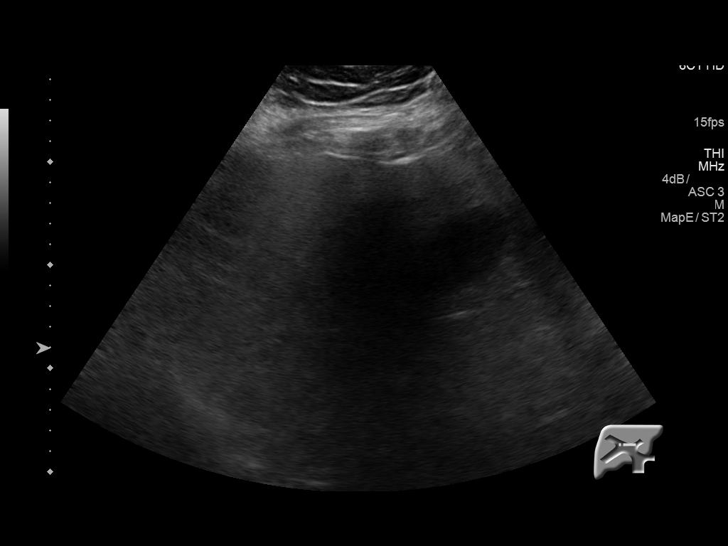
[im 45/49]
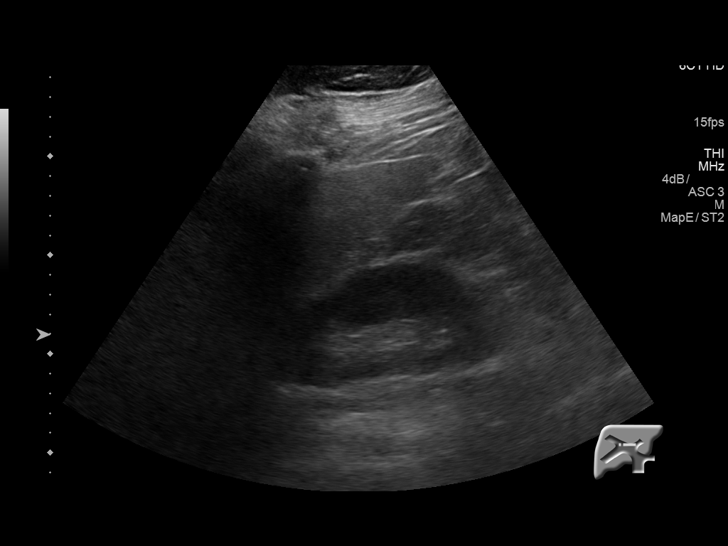
[im 49/49]
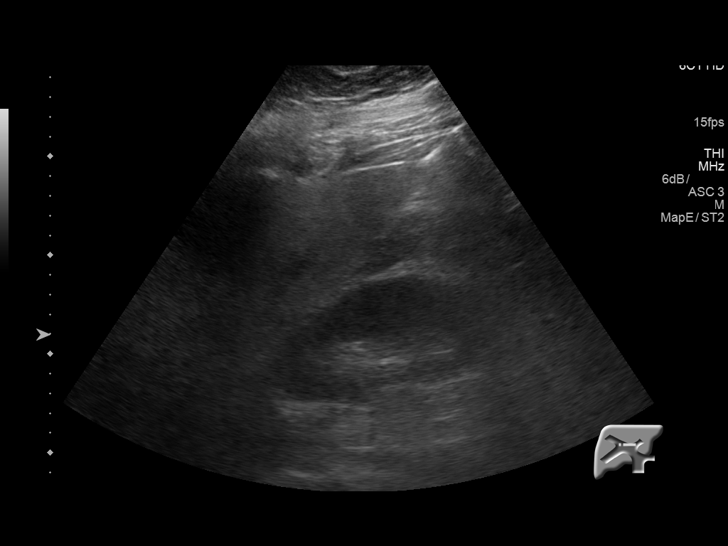

[14 of 25 positions shown; findings below may reference images not displayed]

FINDINGS: Gallbladder:

Vague sludge is noted within the gallbladder. No definite stones are
seen. No gallbladder wall thickening or pericholecystic fluid is
seen. No ultrasonographic Murphy's sign is elicited.

Common bile duct:

Diameter: 0.4 cm, within normal limits in caliber, though not fully
assessed distally.

Liver:

No focal lesion identified. Diffusely increased parenchymal
echogenicity likely reflects fatty infiltration.
IMPRESSION: 1. No acute abnormality seen at the right upper quadrant.
2. Vague sludge noted within the gallbladder. Gallbladder otherwise
unremarkable.
3. Diffuse fatty infiltration within the liver.

## 2018-04-23 ENCOUNTER — Ambulatory Visit (INDEPENDENT_AMBULATORY_CARE_PROVIDER_SITE_OTHER): Payer: 59 | Admitting: Pharmacist

## 2018-04-23 DIAGNOSIS — Z7252 High risk homosexual behavior: Secondary | ICD-10-CM | POA: Diagnosis not present

## 2018-04-23 NOTE — Progress Notes (Signed)
Date:  04/23/2018   HPI: Jeremy Guerrero is a 52 y.o. male who presents to the [place] clinic for his 3 month PrEP follow-up.  Insured   [x]    Uninsured  []    Patient Active Problem List   Diagnosis Date Noted  . History of adenomatous polyp of colon 06/02/2016  . High risk sexual behavior 06/02/2016  . Colon cancer screening   . Benign neoplasm of ascending colon   . Hyperglycemia 02/09/2016  . HTN (hypertension) 01/12/2016  . Morbid obesity ([place]) 01/12/2016  . Knee pain, bilateral 03/24/2014  . OSA (obstructive sleep apnea) 10/30/2013  . Eczema 10/28/2013  . BACK PAIN 06/22/2010  . HYPERLIPIDEMIA 06/14/2010  . ABDOMINAL MASS 03/03/2008  . ABDOMINAL PAIN, LOWER 02/28/2008  . SCIATICA, RIGHT 07/23/2007  . FATIGUE 04/26/2007  . LIBIDO, DECREASED 04/26/2007    Patient's Medications  New Prescriptions   No medications on file  Previous Medications   EMTRICITABINE-TENOFOVIR (TRUVADA) 200-300 MG TABLET    Take 1 tablet by mouth daily.   LISINOPRIL-HYDROCHLOROTHIAZIDE (PRINZIDE,ZESTORETIC) 20-25 MG TABLET    Take 1 tablet by mouth daily.   METHYLPREDNISOLONE (MEDROL DOSEPAK) 4 MG TBPK TABLET    As directed  Modified Medications   No medications on file  Discontinued Medications   No medications on file    Allergies: No Known Allergies  Past Medical History: Past Medical History:  Diagnosis Date  . History of adenomatous polyp of colon 06/02/2016  . Hyperlipidemia   . Hypertension   . Sciatica   . Sleep apnea    wears cpap    Social History: Social History   Socioeconomic History  . Marital status: Single    Spouse name: Not on file  . Number of children: Not on file  . Years of education: Not on file  . Highest education level: Not on file  Occupational History  . Occupation: Best boy: [employer]  . Financial resource strain: Not on file  . Food insecurity:    Worry: Not on file    Inability: Not on file  .  Transportation needs:    Medical: Not on file    Non-medical: Not on file  Tobacco Use  . Smoking status: Former Smoker    Packs/day: 1.00    Years: 30.00    Pack years: 30.00    Types: Cigarettes    Last attempt to quit: 11/27/2013    Years since quitting: 4.4  . Smokeless tobacco: Never Used  Substance and Sexual Activity  . Alcohol use: Yes    Alcohol/week: 0.0 oz    Comment: 6 beers a month  . Drug use: No  . Sexual activity: Yes    Partners: Male    Birth control/protection: None  Lifestyle  . Physical activity:    Days per week: Not on file    Minutes per session: Not on file  . Stress: Not on file  Relationships  . Social connections:    Talks on phone: Not on file    Gets together: Not on file    Attends religious service: Not on file    Active member of club or organization: Not on file    Attends meetings of clubs or organizations: Not on file    Relationship status: Not on file  Other Topics Concern  . Not on file  Social History Narrative  . Not on file    [facility] HIV PREP FLOWSHEET RESULTS 04/23/2018 01/14/2018  10/10/2017  Insurance Status Insured Insured Insured  How did you hear? - Doctor referral -  Gender at birth Male Male Male  Gender identity cis-Male cis-Male -  Risk for HIV - Condomless vaginal or anal intercourse;Hx of STI Condomless vaginal or anal intercourse;Hx of STI  Sex Partners Men only Men only Men only  # sex partners past 3-6 mos 1-3 1-3 1-3  Sex activity preferences Insertive Insertive Insertive  Condom use No No No  Partners genders and ages - M 30-49 M 73+  Treated for STI? - No No  HIV symptoms? N/A N/A N/A  PrEP Eligibility Substantial risk for HIV;HIV negative HIV negative;Substantial risk for HIV -  Paper work received? - No No    Labs:  SCr: Lab Results  Component Value Date   CREATININE 0.93 10/10/2017   CREATININE 0.88 03/26/2017   CREATININE 0.93 11/27/2016   CREATININE 0.81 09/14/2016   CREATININE 0.97 07/06/2016    HIV Lab Results  Component Value Date   HIV NON-REACTIVE 01/14/2018   HIV NON-REACTIVE 10/10/2017   HIV NON-REACTIVE 07/04/2017   HIV NONREACTIVE 03/26/2017   HIV NONREACTIVE 11/27/2016   Hepatitis B Lab Results  Component Value Date   HEPBSAB NON-REACTIVE 01/14/2018   HEPBSAG NEGATIVE 05/19/2014   Hepatitis C No results found for: HEPCAB, HCVRNAPCRQN Hepatitis A Lab Results  Component Value Date   HAV NON-REACTIVE 01/14/2018   RPR and STI Lab Results  Component Value Date   LABRPR NON-REACTIVE 10/10/2017   LABRPR NON REAC 03/26/2017   LABRPR NON REAC 11/27/2016   LABRPR NON REAC 05/16/2016   LABRPR NON REAC 03/24/2015    STI Results GC CT  01/14/2018 Negative Negative  10/10/2017 Negative Negative  03/26/2017 Negative Negative  03/26/2017 Negative Negative  03/26/2017 Negative Negative  12/12/2016 Negative Negative  11/27/2016 Negative Negative  05/30/2016 *Negative *Negative  05/16/2016 Negative Negative  03/24/2015 Negative Negative    Assessment: Jeremy Guerrero is here today to follow-up for PrEP. He is doing well on Truvada with no missed doses and no side effects.  He just got a refill on Monday. He has had only one partner since we saw him last.  He engages in insertive anal intercourse but not receptive.  No oral sex right now either. No issues or concerns for STDs/HIV.  We checked STDs last time, so will defer until next time. Will check a HIV antibody and his kidney function today and send in 3 more months if negative.  Plan: - Continue Truvada PO once daily - HIV antibody and BMET today - F/u with me 11/13 at [place]. Jeremy Guerrero, PharmD, [place], [place] for Infectious Disease 04/23/2018, 4:30 PM

## 2018-04-24 ENCOUNTER — Telehealth: Payer: Self-pay | Admitting: Pharmacist

## 2018-04-24 DIAGNOSIS — Z7252 High risk homosexual behavior: Secondary | ICD-10-CM

## 2018-04-24 LAB — BASIC METABOLIC PANEL
BUN: 14 mg/dL (ref 7–25)
CO2: 30 mmol/L (ref 20–32)
Calcium: 10.2 mg/dL (ref 8.6–10.3)
Chloride: 102 mmol/L (ref 98–110)
Creat: 1.12 mg/dL (ref 0.70–1.33)
GLUCOSE: 95 mg/dL (ref 65–99)
Potassium: 4.3 mmol/L (ref 3.5–5.3)
SODIUM: 141 mmol/L (ref 135–146)

## 2018-04-24 LAB — HIV ANTIBODY (ROUTINE TESTING W REFLEX): HIV 1&2 Ab, 4th Generation: NONREACTIVE

## 2018-04-24 MED ORDER — EMTRICITABINE-TENOFOVIR DF 200-300 MG PO TABS: 1.0000 | ORAL_TABLET | Freq: Every day | ORAL | 2 refills | Status: DC

## 2018-04-24 NOTE — Telephone Encounter (Signed)
Called Jeremy Guerrero to let him know that his HIV antibody was negative.  Will send in 3 more months of Truvada to [facility].

## 2018-05-13 ENCOUNTER — Other Ambulatory Visit: Payer: Self-pay | Admitting: Internal Medicine

## 2018-05-13 DIAGNOSIS — Z7252 High risk homosexual behavior: Secondary | ICD-10-CM

## 2018-05-13 NOTE — Telephone Encounter (Signed)
Please advise on his refill - looks like you just sent in 3 months? Sharyn Lull

## 2018-05-14 ENCOUNTER — Other Ambulatory Visit: Payer: Self-pay | Admitting: Pharmacist

## 2018-05-14 DIAGNOSIS — Z7252 High risk homosexual behavior: Secondary | ICD-10-CM

## 2018-05-14 MED ORDER — EMTRICITABINE-TENOFOVIR DF 200-300 MG PO TABS: 1.0000 | ORAL_TABLET | Freq: Every day | ORAL | 2 refills | Status: DC

## 2018-05-14 NOTE — Progress Notes (Signed)
Sent Truvada to [facility] per [employer].

## 2018-05-14 NOTE — Telephone Encounter (Signed)
Yes I did.. But not to that pharmacy. I'll call the patient to see where I need to send it. Thank you!

## 2018-05-21 ENCOUNTER — Ambulatory Visit: Payer: 59

## 2018-06-23 ENCOUNTER — Other Ambulatory Visit: Payer: Self-pay | Admitting: Internal Medicine

## 2018-07-01 MED FILL — LISINOPRIL-HCTZ 20-25 MG TA: 20-25 | 90 days supply | Qty: 90 | Fill #1

## 2018-07-31 ENCOUNTER — Ambulatory Visit (INDEPENDENT_AMBULATORY_CARE_PROVIDER_SITE_OTHER): Payer: 59 | Admitting: Pharmacist

## 2018-07-31 DIAGNOSIS — Z7252 High risk homosexual behavior: Secondary | ICD-10-CM

## 2018-07-31 NOTE — Progress Notes (Signed)
Date:  07/31/2018   HPI: Jeremy Guerrero is a 52 y.o. male presenting for 3 month PrEP follow up.  Insured   [x]    Uninsured  []    Patient Active Problem List   Diagnosis Date Noted  . History of adenomatous polyp of colon 06/02/2016  . High risk sexual behavior 06/02/2016  . Colon cancer screening   . Benign neoplasm of ascending colon   . Hyperglycemia 02/09/2016  . HTN (hypertension) 01/12/2016  . Morbid obesity ([place]) 01/12/2016  . Knee pain, bilateral 03/24/2014  . OSA (obstructive sleep apnea) 10/30/2013  . Eczema 10/28/2013  . BACK PAIN 06/22/2010  . HYPERLIPIDEMIA 06/14/2010  . ABDOMINAL MASS 03/03/2008  . ABDOMINAL PAIN, LOWER 02/28/2008  . SCIATICA, RIGHT 07/23/2007  . FATIGUE 04/26/2007  . LIBIDO, DECREASED 04/26/2007    Patient's Medications  New Prescriptions   No medications on file  Previous Medications   EMTRICITABINE-TENOFOVIR (TRUVADA) 200-300 MG TABLET    Take 1 tablet by mouth daily.   LISINOPRIL-HYDROCHLOROTHIAZIDE (PRINZIDE,ZESTORETIC) 20-25 MG TABLET    Take 1 tablet by mouth daily.   METHYLPREDNISOLONE (MEDROL DOSEPAK) 4 MG TBPK TABLET    As directed  Modified Medications   No medications on file  Discontinued Medications   No medications on file    Allergies: No Known Allergies  Past Medical History: Past Medical History:  Diagnosis Date  . History of adenomatous polyp of colon 06/02/2016  . Hyperlipidemia   . Hypertension   . Sciatica   . Sleep apnea    wears cpap    Social History: Social History   Socioeconomic History  . Marital status: Single    Spouse name: Not on file  . Number of children: Not on file  . Years of education: Not on file  . Highest education level: Not on file  Occupational History  . Occupation: Best boy: [employer]  . Financial resource strain: Not on file  . Food insecurity:    Worry: Not on file    Inability: Not on file  . Transportation needs:   Medical: Not on file    Non-medical: Not on file  Tobacco Use  . Smoking status: Former Smoker    Packs/day: 1.00    Years: 30.00    Pack years: 30.00    Types: Cigarettes    Last attempt to quit: 11/27/2013    Years since quitting: 4.6  . Smokeless tobacco: Never Used  Substance and Sexual Activity  . Alcohol use: Yes    Alcohol/week: 0.0 standard drinks    Comment: 6 beers a month  . Drug use: No  . Sexual activity: Yes    Partners: Male    Birth control/protection: None  Lifestyle  . Physical activity:    Days per week: Not on file    Minutes per session: Not on file  . Stress: Not on file  Relationships  . Social connections:    Talks on phone: Not on file    Gets together: Not on file    Attends religious service: Not on file    Active member of club or organization: Not on file    Attends meetings of clubs or organizations: Not on file    Relationship status: Not on file  Other Topics Concern  . Not on file  Social History Narrative  . Not on file    [facility] HIV PREP FLOWSHEET RESULTS 07/31/2018 04/23/2018 01/14/2018 10/10/2017  Insurance Status Insured Insured  Insured Insured  How did you hear? - - Doctor referral -  Gender at birth Male Male Male Male  Gender identity cis-Male cis-Male cis-Male -  Risk for HIV - - Condomless vaginal or anal intercourse;Hx of STI Condomless vaginal or anal intercourse;Hx of STI  Sex Partners - Men only Men only Men only  # sex partners past 3-6 mos - 1-3 1-3 1-3  Sex activity preferences - Insertive Insertive Insertive  Condom use - No No No  Partners genders and ages - - M 48-49 59 27+  Treated for STI? - - No No  HIV symptoms? N/A N/A N/A N/A  PrEP Eligibility HIV negative;CrCl >60 ml/min Substantial risk for HIV;HIV negative HIV negative;Substantial risk for HIV -  Paper work received? - - No No    Labs:  SCr: Lab Results  Component Value Date   CREATININE 1.12 04/23/2018   CREATININE 0.93 10/10/2017   CREATININE 0.88  03/26/2017   CREATININE 0.93 11/27/2016   CREATININE 0.81 09/14/2016   HIV Lab Results  Component Value Date   HIV NON-REACTIVE 04/23/2018   HIV NON-REACTIVE 01/14/2018   HIV NON-REACTIVE 10/10/2017   HIV NON-REACTIVE 07/04/2017   HIV NONREACTIVE 03/26/2017   Hepatitis B Lab Results  Component Value Date   HEPBSAB NON-REACTIVE 01/14/2018   HEPBSAG NEGATIVE 05/19/2014   Hepatitis C No results found for: HEPCAB, HCVRNAPCRQN Hepatitis A Lab Results  Component Value Date   HAV NON-REACTIVE 01/14/2018   RPR and STI Lab Results  Component Value Date   LABRPR NON-REACTIVE 10/10/2017   LABRPR NON REAC 03/26/2017   LABRPR NON REAC 11/27/2016   LABRPR NON REAC 05/16/2016   LABRPR NON REAC 03/24/2015    STI Results GC CT  01/14/2018 Negative Negative  10/10/2017 Negative Negative  03/26/2017 Negative Negative  03/26/2017 Negative Negative  03/26/2017 Negative Negative  12/12/2016 Negative Negative  11/27/2016 Negative Negative  05/30/2016 *Negative *Negative  05/16/2016 Negative Negative  03/24/2015 Negative Negative    Assessment: Jeremy Guerrero is here for 3 month PrEP follow up. He continues to do well on Truvada with no side effects. We had received communication from his pharmacy that his last refill was on 06/28/18 and they were unable to get in touch with him. However, Jeremy Guerrero says he has ~10 tablets left in his bottle and has not missed any doses. He is going to call the pharmacy back today. He has not had any partners since his last visit, therefore we will defer STD testing today. We also discussed the approval of Descovy for PrEP and Jeremy Guerrero has agreed to switch to Descovy.  Plan: HIV Ab If negative, 3 months Descovy Next f/u on 2/12 at [place]. Gerarda Fraction, PharmD PGY2 Infectious Diseases Pharmacy Resident Phone: [phone] 07/31/2018, 4:19 PM

## 2018-08-01 ENCOUNTER — Other Ambulatory Visit: Payer: Self-pay | Admitting: Pharmacist

## 2018-08-01 ENCOUNTER — Telehealth: Payer: Self-pay | Admitting: Pharmacist

## 2018-08-01 DIAGNOSIS — Z7252 High risk homosexual behavior: Secondary | ICD-10-CM

## 2018-08-01 LAB — HIV ANTIBODY (ROUTINE TESTING W REFLEX): HIV: NONREACTIVE

## 2018-08-01 MED ORDER — EMTRICITABINE-TENOFOVIR AF 200-25 MG PO TABS: 1.0000 | ORAL_TABLET | Freq: Every day | ORAL | 2 refills | Status: DC

## 2018-08-01 NOTE — Telephone Encounter (Signed)
Called Jeremy Guerrero to let him know that his HIV antibody is negative and we will send in a prescription for 3 months of Descovy. Left HIPAA-compliant voicemail.

## 2018-09-05 ENCOUNTER — Telehealth: Payer: Self-pay

## 2018-09-05 NOTE — Telephone Encounter (Signed)
Pa for descovy was fax to office. Patient is being followed by [facility]. Initiated PA and faxed forms and supporting labs/ office note to [phone]. [place]

## 2018-10-11 ENCOUNTER — Telehealth: Payer: Self-pay | Admitting: Pharmacy Technician

## 2018-10-11 NOTE — Telephone Encounter (Signed)
PA approved for Descovy #[id number] through 04/09/2020

## 2018-10-18 ENCOUNTER — Telehealth: Payer: Self-pay | Admitting: Pharmacy Technician

## 2018-10-18 NOTE — Telephone Encounter (Addendum)
HIV Patient Advocate Encounter  Prior Authorization for Descovy has been approved.    PA# [id number] Effective dates: 10/11/2018 through 10/11/2019.   [facility] was aware.  They shipped Descovy to arrive 10/17/2018.  [facility] will continue to follow.  Jeremy Guerrero. Jeremy Guerrero Patient [facility] for Infectious Disease Phone: [phone] Fax:  [phone]

## 2018-10-30 ENCOUNTER — Ambulatory Visit: Payer: 59 | Admitting: Pharmacist

## 2018-10-31 ENCOUNTER — Ambulatory Visit (INDEPENDENT_AMBULATORY_CARE_PROVIDER_SITE_OTHER): Payer: BLUE CROSS/BLUE SHIELD | Admitting: Pharmacist

## 2018-10-31 ENCOUNTER — Other Ambulatory Visit (HOSPITAL_COMMUNITY)
Admission: RE | Admit: 2018-10-31 | Discharge: 2018-10-31 | Disposition: A | Discharge status: Home / Self Care | Payer: BLUE CROSS/BLUE SHIELD | Source: Ambulatory Visit | Attending: Internal Medicine | Admitting: Internal Medicine

## 2018-10-31 DIAGNOSIS — Z7252 High risk homosexual behavior: Secondary | ICD-10-CM

## 2018-10-31 NOTE — Progress Notes (Signed)
Date:  10/31/2018   HPI: Jeremy Guerrero is a 53 y.o. male who presents to the [place] clinic for 3 month PrEP follow-up.  Insured   [x]    Uninsured  []    Patient Active Problem List   Diagnosis Date Noted  . History of adenomatous polyp of colon 06/02/2016  . High risk sexual behavior 06/02/2016  . Colon cancer screening   . Benign neoplasm of ascending colon   . Hyperglycemia 02/09/2016  . HTN (hypertension) 01/12/2016  . Morbid obesity ([place]) 01/12/2016  . Knee pain, bilateral 03/24/2014  . OSA (obstructive sleep apnea) 10/30/2013  . Eczema 10/28/2013  . BACK PAIN 06/22/2010  . HYPERLIPIDEMIA 06/14/2010  . ABDOMINAL MASS 03/03/2008  . ABDOMINAL PAIN, LOWER 02/28/2008  . SCIATICA, RIGHT 07/23/2007  . FATIGUE 04/26/2007  . LIBIDO, DECREASED 04/26/2007    Patient's Medications  New Prescriptions   No medications on file  Previous Medications   EMTRICITABINE-TENOFOVIR AF (DESCOVY) 200-25 MG TABLET    Take 1 tablet by mouth daily.   LISINOPRIL-HYDROCHLOROTHIAZIDE (PRINZIDE,ZESTORETIC) 20-25 MG TABLET    Take 1 tablet by mouth daily.   METHYLPREDNISOLONE (MEDROL DOSEPAK) 4 MG TBPK TABLET    As directed  Modified Medications   No medications on file  Discontinued Medications   No medications on file    Allergies: No Known Allergies  Past Medical History: Past Medical History:  Diagnosis Date  . History of adenomatous polyp of colon 06/02/2016  . Hyperlipidemia   . Hypertension   . Sciatica   . Sleep apnea    wears cpap    Social History: Social History   Socioeconomic History  . Marital status: Single    Spouse name: Not on file  . Number of children: Not on file  . Years of education: Not on file  . Highest education level: Not on file  Occupational History  . 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 Financial resource strain: Not on file  . Food insecurity:    Worry: Not on file    Inability: Not on file  .  Transportation needs:    Medical: Not on file    Non-medical: Not on file  Tobacco Use  . Smoking status: Former Smoker    Packs/day: 1.00    Years: 30.00    Pack years: 30.00    Types: Cigarettes    Last attempt to quit: 11/27/2013    Years since quitting: 4.9  . Smokeless tobacco: Never Used  Substance and Sexual Activity  . Alcohol use: Yes    Alcohol/week: 0.0 standard drinks    Comment: 6 beers a month  . Drug use: No  . Sexual activity: Yes    Partners: Male    Birth control/protection: None  Lifestyle  . Physical activity:    Days per week: Not on file    Minutes per session: Not on file  . Stress: Not on file  Relationships  . Social connections:    Talks on phone: Not on file    Gets together: Not on file    Attends religious service: Not on file    Active member of club or organization: Not on file    Attends meetings of clubs or organizations: Not on file    Relationship status: Not on file  Other Topics Concern  . Not on file  Social History Narrative  . Not on file    [facility] HIV PREP FLOWSHEET RESULTS 10/31/2018  07/31/2018 04/23/2018 01/14/2018 10/10/2017  Insurance Status Insured Insured Insured Insured Insured  How did you hear? - - - Doctor referral -  Gender at birth Male Male Male Male Male  Gender identity cis-Male cis-Male cis-Male cis-Male -  Risk for HIV Condomless vaginal or anal intercourse - - Condomless vaginal or anal intercourse;Hx of STI Condomless vaginal or anal intercourse;Hx of STI  Sex Partners Men only - Men only Men only Men only  # sex partners past 3-6 mos 1-3 - 1-3 1-3 1-3  Sex activity preferences Insertive - Insertive Insertive Insertive  Condom use No - No No No  Partners genders and ages - - - M 60-49 M 44+  Treated for STI? No - - No No  HIV symptoms? N/A N/A N/A N/A N/A  PrEP Eligibility Substantial risk for HIV HIV negative;CrCl >60 ml/min Substantial risk for HIV;HIV negative HIV negative;Substantial risk for HIV -  Paper work  received? - - - No No    Labs:  SCr: Lab Results  Component Value Date   CREATININE 1.12 04/23/2018   CREATININE 0.93 10/10/2017   CREATININE 0.88 03/26/2017   CREATININE 0.93 11/27/2016   CREATININE 0.81 09/14/2016   HIV Lab Results  Component Value Date   HIV NON-REACTIVE 07/31/2018   HIV NON-REACTIVE 04/23/2018   HIV NON-REACTIVE 01/14/2018   HIV NON-REACTIVE 10/10/2017   HIV NON-REACTIVE 07/04/2017   Hepatitis B Lab Results  Component Value Date   HEPBSAB NON-REACTIVE 01/14/2018   HEPBSAG NEGATIVE 05/19/2014   Hepatitis C No results found for: HEPCAB, HCVRNAPCRQN Hepatitis A Lab Results  Component Value Date   HAV NON-REACTIVE 01/14/2018   RPR and STI Lab Results  Component Value Date   LABRPR NON-REACTIVE 10/10/2017   LABRPR NON REAC 03/26/2017   LABRPR NON REAC 11/27/2016   LABRPR NON REAC 05/16/2016   LABRPR NON REAC 03/24/2015    STI Results GC CT  01/14/2018 Negative Negative  10/10/2017 Negative Negative  03/26/2017 Negative Negative  03/26/2017 Negative Negative  03/26/2017 Negative Negative  12/12/2016 Negative Negative  11/27/2016 Negative Negative  05/30/2016 *Negative *Negative  05/16/2016 Negative Negative  03/24/2015 Negative Negative    Assessment: Jeremy Guerrero is here today for his 3 month PrEP follow-up appointment and lab work.  He switched to Descovy at the last appointment and has been taking it for just a little over a month.  He did have some on and off diarrhea and abdominal fullness/gurggling for the first few weeks on Descovy.  It has since resolved. I told him we could always go back to Truvada if needed.  He has had 2 new partners since I saw him last. He is the top partner in each relationship and does not use condoms.  He is aware of the risk.  Will check STDs today since he has new partners with zero condom use.  Will also check a HIV antibody and BMET today and see him back in 3 months.  Plan: - HIV antibody, BMET, urine cytology, RPR  today - Descovy x 3 months if HIV negative  - F/u in 3 months  Cassie L. Kuppelweiser, PharmD, BCIDP, AAHIVP, CPP Infectious Diseases [place] for Infectious [facility] 10/31/2018, 3:55 PM.

## 2018-11-01 ENCOUNTER — Telehealth: Payer: Self-pay | Admitting: Pharmacist

## 2018-11-01 DIAGNOSIS — Z7252 High risk homosexual behavior: Secondary | ICD-10-CM

## 2018-11-01 LAB — BASIC METABOLIC PANEL
BUN: 10 mg/dL (ref 7–25)
CALCIUM: 9.8 mg/dL (ref 8.6–10.3)
CO2: 32 mmol/L (ref 20–32)
CREATININE: 0.91 mg/dL (ref 0.70–1.33)
Chloride: 104 mmol/L (ref 98–110)
GLUCOSE: 86 mg/dL (ref 65–99)
POTASSIUM: 4.5 mmol/L (ref 3.5–5.3)
SODIUM: 143 mmol/L (ref 135–146)

## 2018-11-01 LAB — RPR: RPR Ser Ql: NONREACTIVE

## 2018-11-01 LAB — HIV ANTIBODY (ROUTINE TESTING W REFLEX): HIV 1&2 Ab, 4th Generation: NONREACTIVE

## 2018-11-01 MED ORDER — EMTRICITABINE-TENOFOVIR AF 200-25 MG PO TABS: 1.0000 | ORAL_TABLET | Freq: Every day | ORAL | 2 refills | Status: DC

## 2018-11-01 NOTE — Telephone Encounter (Signed)
Called patient to let him know that his HIV antibody was negative.  Will send in 3 more months of Descovy. Left a HIPAA compliant VM.

## 2018-11-04 LAB — URINE CYTOLOGY ANCILLARY ONLY
Chlamydia: NEGATIVE
NEISSERIA GONORRHEA: NEGATIVE

## 2019-02-12 ENCOUNTER — Ambulatory Visit: Payer: BLUE CROSS/BLUE SHIELD | Admitting: Pharmacist

## 2019-02-19 ENCOUNTER — Ambulatory Visit (INDEPENDENT_AMBULATORY_CARE_PROVIDER_SITE_OTHER): Payer: BLUE CROSS/BLUE SHIELD | Admitting: Pharmacist

## 2019-02-19 ENCOUNTER — Other Ambulatory Visit: Payer: Self-pay

## 2019-02-19 DIAGNOSIS — Z7252 High risk homosexual behavior: Secondary | ICD-10-CM | POA: Diagnosis not present

## 2019-02-19 NOTE — Progress Notes (Signed)
Date:  02/21/2019   HPI: Jeremy Guerrero is a 53 y.o. male who presents to the [place] clinic for 3 month PrEP follow-up.  Insured   [x]    Uninsured  []    Patient Active Problem List   Diagnosis Date Noted  . History of adenomatous polyp of colon 06/02/2016  . High risk sexual behavior 06/02/2016  . Colon cancer screening   . Benign neoplasm of ascending colon   . Hyperglycemia 02/09/2016  . HTN (hypertension) 01/12/2016  . Morbid obesity ([place]) 01/12/2016  . Knee pain, bilateral 03/24/2014  . OSA (obstructive sleep apnea) 10/30/2013  . Eczema 10/28/2013  . BACK PAIN 06/22/2010  . HYPERLIPIDEMIA 06/14/2010  . ABDOMINAL MASS 03/03/2008  . ABDOMINAL PAIN, LOWER 02/28/2008  . SCIATICA, RIGHT 07/23/2007  . FATIGUE 04/26/2007  . LIBIDO, DECREASED 04/26/2007    Patient's Medications  New Prescriptions   No medications on file  Previous Medications   LISINOPRIL-HYDROCHLOROTHIAZIDE (PRINZIDE,ZESTORETIC) 20-25 MG TABLET    Take 1 tablet by mouth daily.   METHYLPREDNISOLONE (MEDROL DOSEPAK) 4 MG TBPK TABLET    As directed  Modified Medications   Modified Medication Previous Medication   EMTRICITABINE-TENOFOVIR AF (DESCOVY) 200-25 MG TABLET emtricitabine-tenofovir AF (DESCOVY) 200-25 MG tablet      Take 1 tablet by mouth daily.    Take 1 tablet by mouth daily.  Discontinued Medications   No medications on file    Allergies: No Known Allergies  Past Medical History: Past Medical History:  Diagnosis Date  . History of adenomatous polyp of colon 06/02/2016  . Hyperlipidemia   . Hypertension   . Sciatica   . Sleep apnea    wears cpap    Social History: Social History   Socioeconomic History  . Marital status: Single    Spouse name: Not on file  . Number of children: Not on file  . Years of education: Not on file  . Highest education level: Not on file  Occupational History  . 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 Financial  resource strain: Not on file  . Food insecurity:    Worry: Not on file    Inability: Not on file  . Transportation needs:    Medical: Not on file    Non-medical: Not on file  Tobacco Use  . Smoking status: Former Smoker    Packs/day: 1.00    Years: 30.00    Pack years: 30.00    Types: Cigarettes    Last attempt to quit: 11/27/2013    Years since quitting: 5.2  . Smokeless tobacco: Never Used  Substance and Sexual Activity  . Alcohol use: Yes    Alcohol/week: 0.0 standard drinks    Comment: 6 beers a month  . Drug use: No  . Sexual activity: Yes    Partners: Male    Birth control/protection: None  Lifestyle  . Physical activity:    Days per week: Not on file    Minutes per session: Not on file  . Stress: Not on file  Relationships  . Social connections:    Talks on phone: Not on file    Gets together: Not on file    Attends religious service: Not on file    Active member of club or organization: Not on file    Attends meetings of clubs or organizations: Not on file    Relationship status: Not on file  Other Topics Concern  . Not on file  Social History Narrative  . Not on file    [facility] HIV PREP FLOWSHEET RESULTS 02/21/2019 10/31/2018 07/31/2018 04/23/2018 01/14/2018 10/10/2017  Insurance Status Insured Insured Insured Insured Insured Insured  How did you hear? - - - - Doctor referral -  Gender at birth Male Male Male Male Male Male  Gender identity cis-Male cis-Male cis-Male cis-Male cis-Male -  Risk for HIV - Condomless vaginal or anal intercourse - - Condomless vaginal or anal intercourse;Hx of STI Condomless vaginal or anal intercourse;Hx of STI  Sex Partners Men only Men only - Men only Men only Men only  # sex partners past 3-6 mos - 1-3 - 1-3 1-3 1-3  Sex activity preferences - Insertive - Insertive Insertive Insertive  Condom use - No - No No No  Partners genders and ages - - - - M 62-49 65 62+  Treated for STI? - No - - No No  HIV symptoms? - N/A N/A N/A N/A N/A   PrEP Eligibility - Substantial risk for HIV HIV negative;CrCl >60 ml/min Substantial risk for HIV;HIV negative HIV negative;Substantial risk for HIV -  Paper work received? - - - - No No    Labs:  SCr: Lab Results  Component Value Date   CREATININE 0.91 10/31/2018   CREATININE 1.12 04/23/2018   CREATININE 0.93 10/10/2017   CREATININE 0.88 03/26/2017   CREATININE 0.93 11/27/2016   HIV Lab Results  Component Value Date   HIV NON-REACTIVE 02/19/2019   HIV NON-REACTIVE 10/31/2018   HIV NON-REACTIVE 07/31/2018   HIV NON-REACTIVE 04/23/2018   HIV NON-REACTIVE 01/14/2018   Hepatitis B Lab Results  Component Value Date   HEPBSAB NON-REACTIVE 01/14/2018   HEPBSAG NEGATIVE 05/19/2014   Hepatitis C No results found for: HEPCAB, HCVRNAPCRQN Hepatitis A Lab Results  Component Value Date   HAV NON-REACTIVE 01/14/2018   RPR and STI Lab Results  Component Value Date   LABRPR NON-REACTIVE 10/31/2018   LABRPR NON-REACTIVE 10/10/2017   LABRPR NON REAC 03/26/2017   LABRPR NON REAC 11/27/2016   LABRPR NON REAC 05/16/2016    STI Results GC CT  10/31/2018 Negative Negative  01/14/2018 Negative Negative  10/10/2017 Negative Negative  03/26/2017 Negative Negative  03/26/2017 Negative Negative  03/26/2017 Negative Negative  12/12/2016 Negative Negative  11/27/2016 Negative Negative  05/30/2016 *Negative *Negative  05/16/2016 Negative Negative  03/24/2015 Negative Negative    Assessment: Jeremy Guerrero is here today for his 3 month PrEP follow-up and lab appointment. He continues on Descovy with no issues.  He has been sheltering at home lately due to the [place] 19 pandemic and has not had any partners since I saw him last.  No issues with the medication, his insurance, or pharmacy.  Will just check a HIV antibody today and see him back in 3 months.  Plan: - HIV antibody today - Descovy x 3 months if HIV negative - F/u with me again 9/2 at 230pm  Jeremy Guerrero, PharmD, BCIDP, AAHIVP, [place] for Infectious Disease 02/21/2019, 11:54 AM

## 2019-02-20 ENCOUNTER — Telehealth: Payer: Self-pay | Admitting: Pharmacist

## 2019-02-20 DIAGNOSIS — Z7252 High risk homosexual behavior: Secondary | ICD-10-CM

## 2019-02-20 LAB — HIV ANTIBODY (ROUTINE TESTING W REFLEX): HIV 1&2 Ab, 4th Generation: NONREACTIVE

## 2019-02-20 MED ORDER — EMTRICITABINE-TENOFOVIR AF 200-25 MG PO TABS: 1.0000 | ORAL_TABLET | Freq: Every day | ORAL | 2 refills | Status: DC

## 2019-02-20 NOTE — Telephone Encounter (Signed)
Called patient to let him know that his HIV antibody was negative - left VM.  Will send in 3 more months of Descovy.

## 2019-03-18 ENCOUNTER — Other Ambulatory Visit: Payer: Self-pay | Admitting: Family Medicine

## 2019-03-19 MED FILL — LISINOPRIL-HCTZ 20-25 MG TA: 20-25 | 30 days supply | Qty: 30 | Fill #0

## 2019-04-11 ENCOUNTER — Ambulatory Visit: Payer: BLUE CROSS/BLUE SHIELD | Admitting: Family Medicine

## 2019-04-11 ENCOUNTER — Other Ambulatory Visit: Payer: Self-pay

## 2019-04-11 ENCOUNTER — Encounter: Payer: Self-pay | Admitting: Family Medicine

## 2019-04-11 VITALS — BP 128/70 | HR 93 | Temp 98.7°F | Wt 373.0 lb

## 2019-04-11 DIAGNOSIS — K602 Anal fissure, unspecified: Secondary | ICD-10-CM

## 2019-04-11 MED ORDER — PRAMOXINE-HC 1-1 % EX CREA: TOPICAL_CREAM | Freq: Three times a day (TID) | CUTANEOUS | 1 refills | Status: DC

## 2019-04-11 MED FILL — PRAMOSONE 1-1 % CREA: 1-1 | 28 days supply | Qty: 28 | Fill #0

## 2019-04-11 NOTE — Progress Notes (Signed)
   Subjective:    Patient ID: Jeremy Guerrero, male    DOB: 1965-10-03, 53 y.o.   MRN: [id number]  HPI Here for one week of pain at the anus and occasional light bleeding. His stools are normal and pass easily. He uses Metamucil daly.    Review of Systems  Constitutional: Negative.   Respiratory: Negative.   Cardiovascular: Negative.   Gastrointestinal: Positive for anal bleeding and rectal pain. Negative for abdominal pain, constipation, diarrhea, nausea and vomiting.       Objective:   Physical Exam Constitutional:      Appearance: Normal appearance.  Cardiovascular:     Rate and Rhythm: Normal rate and regular rhythm.     Pulses: Normal pulses.     Heart sounds: Normal heart sounds.  Pulmonary:     Effort: Pulmonary effort is normal.     Breath sounds: Normal breath sounds.  Genitourinary:    Comments: There is a small fissure at the anal verge, no external hemorrhoids  Neurological:     Mental Status: He is alert.           Assessment & Plan:  Anal fissure, apply Analpram cream as needed.  Alysia Penna, MD

## 2019-04-23 ENCOUNTER — Other Ambulatory Visit: Payer: Self-pay | Admitting: Family Medicine

## 2019-04-23 MED FILL — LISINOPRIL-HCTZ 20-25 MG TA: 20-25 | 30 days supply | Qty: 30 | Fill #0

## 2019-05-21 ENCOUNTER — Other Ambulatory Visit: Payer: Self-pay

## 2019-05-21 ENCOUNTER — Ambulatory Visit: Payer: BLUE CROSS/BLUE SHIELD | Admitting: Pharmacist

## 2019-05-21 ENCOUNTER — Ambulatory Visit (INDEPENDENT_AMBULATORY_CARE_PROVIDER_SITE_OTHER): Payer: BLUE CROSS/BLUE SHIELD | Admitting: Pharmacist

## 2019-05-21 DIAGNOSIS — Z7252 High risk homosexual behavior: Secondary | ICD-10-CM | POA: Diagnosis not present

## 2019-05-21 NOTE — Progress Notes (Signed)
Date:  05/21/2019   HPI: Jeremy Guerrero is a 53 y.o. male who presents to the [facility] for 3 month PrEP follow-up.  Insured   [x]    Uninsured  []    Patient Active Problem List   Diagnosis Date Noted  . History of adenomatous polyp of colon 06/02/2016  . High risk sexual behavior 06/02/2016  . Colon cancer screening   . Benign neoplasm of ascending colon   . Hyperglycemia 02/09/2016  . HTN (hypertension) 01/12/2016  . Morbid obesity ([place]) 01/12/2016  . Knee pain, bilateral 03/24/2014  . OSA (obstructive sleep apnea) 10/30/2013  . Eczema 10/28/2013  . BACK PAIN 06/22/2010  . HYPERLIPIDEMIA 06/14/2010  . ABDOMINAL MASS 03/03/2008  . ABDOMINAL PAIN, LOWER 02/28/2008  . SCIATICA, RIGHT 07/23/2007  . FATIGUE 04/26/2007  . LIBIDO, DECREASED 04/26/2007    Patient's Medications  New Prescriptions   No medications on file  Previous Medications   EMTRICITABINE-TENOFOVIR AF (DESCOVY) 200-25 MG TABLET    Take 1 tablet by mouth daily.   LISINOPRIL-HYDROCHLOROTHIAZIDE (ZESTORETIC) 20-25 MG TABLET    TAKE 1 TABLET BY MOUTH DAILY.   METHYLPREDNISOLONE (MEDROL DOSEPAK) 4 MG TBPK TABLET    As directed   PRAMOXINE-HYDROCORTISONE (ANALPRAM HC) CREAM    Apply topically 3 (three) times daily.  Modified Medications   No medications on file  Discontinued Medications   No medications on file    Allergies: No Known Allergies  Past Medical History: Past Medical History:  Diagnosis Date  . History of adenomatous polyp of colon 06/02/2016  . Hyperlipidemia   . Hypertension   . Sciatica   . Sleep apnea    wears cpap    Social History: Social History   Socioeconomic History  . Marital status: Single    Spouse name: Not on file  . Number of children: Not on file  . Years of education: Not on file  . Highest education level: Not on file  Occupational History  . Occupation: Best boy: [employer]  . Financial resource strain: Not on  file  . Food insecurity    Worry: Not on file    Inability: Not on file  . Transportation needs    Medical: Not on file    Non-medical: Not on file  Tobacco Use  . Smoking status: Former Smoker    Packs/day: 1.00    Years: 30.00    Pack years: 30.00    Types: Cigarettes    Quit date: 11/27/2013    Years since quitting: 5.4  . Smokeless tobacco: Never Used  Substance and Sexual Activity  . Alcohol use: Yes    Alcohol/week: 0.0 standard drinks    Comment: 6 beers a month  . Drug use: No  . Sexual activity: Yes    Partners: Male    Birth control/protection: None  Lifestyle  . Physical activity    Days per week: Not on file    Minutes per session: Not on file  . Stress: Not on file  Relationships  . Social Herbalist on phone: Not on file    Gets together: Not on file    Attends religious service: Not on file    Active member of club or organization: Not on file    Attends meetings of clubs or organizations: Not on file    Relationship status: Not on file  Other Topics Concern  . Not on file  Social History Narrative  .  Not on file    [facility] HIV PREP FLOWSHEET RESULTS 05/21/2019 02/21/2019 10/31/2018 07/31/2018 04/23/2018 01/14/2018 10/10/2017  Insurance Status Insured Insured Insured Insured Insured Insured Insured  How did you hear? - - - - - Doctor referral -  Gender at birth Male Male Male Male Male Male Male  Gender identity cis-Male cis-Male cis-Male cis-Male cis-Male cis-Male -  Risk for HIV - - Condomless vaginal or anal intercourse - - Condomless vaginal or anal intercourse;Hx of STI Condomless vaginal or anal intercourse;Hx of STI  Sex Partners Men only Men only Men only - Men only Men only Men only  # sex partners past 3-6 mos 4-6 - 1-3 - 1-3 1-3 1-3  Sex activity preferences Insertive - Insertive - Insertive Insertive Insertive  Condom use No - No - No No No  Partners genders and ages - - - - - M 33-49 M 12+  Treated for STI? No - No - - No No  HIV  symptoms? N/A - N/A N/A N/A N/A N/A  PrEP Eligibility Substantial risk for HIV - Substantial risk for HIV HIV negative;CrCl >60 ml/min Substantial risk for HIV;HIV negative HIV negative;Substantial risk for HIV -  Paper work received? No - - - - No No    Labs:  SCr: Lab Results  Component Value Date   CREATININE 0.91 10/31/2018   CREATININE 1.12 04/23/2018   CREATININE 0.93 10/10/2017   CREATININE 0.88 03/26/2017   CREATININE 0.93 11/27/2016   HIV Lab Results  Component Value Date   HIV NON-REACTIVE 02/19/2019   HIV NON-REACTIVE 10/31/2018   HIV NON-REACTIVE 07/31/2018   HIV NON-REACTIVE 04/23/2018   HIV NON-REACTIVE 01/14/2018   Hepatitis B Lab Results  Component Value Date   HEPBSAB NON-REACTIVE 01/14/2018   HEPBSAG NEGATIVE 05/19/2014   Hepatitis C No results found for: HEPCAB, HCVRNAPCRQN Hepatitis A Lab Results  Component Value Date   HAV NON-REACTIVE 01/14/2018   RPR and STI Lab Results  Component Value Date   LABRPR NON-REACTIVE 10/31/2018   LABRPR NON-REACTIVE 10/10/2017   LABRPR NON REAC 03/26/2017   LABRPR NON REAC 11/27/2016   LABRPR NON REAC 05/16/2016    STI Results GC CT  10/31/2018 Negative Negative  01/14/2018 Negative Negative  10/10/2017 Negative Negative  03/26/2017 Negative Negative  03/26/2017 Negative Negative  03/26/2017 Negative Negative  12/12/2016 Negative Negative  11/27/2016 Negative Negative  05/30/2016 *Negative *Negative  05/16/2016 Negative Negative  03/24/2015 Negative Negative    Assessment: Jeremy Guerrero is here today for his 3 month PrEP follow-up appointment.  He continues on Descovy daily with no missed doses and no side effects.  He has no issues getting it from his pharmacy and has no insurance or copay changes.  He did recently lose his sister to ovarian cancer and is having a tough time dealing with that.    He has had 4 partners recently with no condom use. He is the top partner only. No issues today or concerns. Will check labs and  see him back in 3 months.  Plan: - HIV antibody, RPR, BMET, urine cytology - Descovy x 3 months if HIV negative - F/u with me again 12/2 at 230pm  Madelline Eshbach L. Allon Costlow, PharmD, BCIDP, AAHIVP, [place] for Infectious Disease 05/21/2019, 4:13 PM

## 2019-05-22 ENCOUNTER — Other Ambulatory Visit: Payer: Self-pay | Admitting: Family Medicine

## 2019-05-22 ENCOUNTER — Other Ambulatory Visit: Payer: Self-pay | Admitting: Pharmacist

## 2019-05-22 DIAGNOSIS — Z7252 High risk homosexual behavior: Secondary | ICD-10-CM

## 2019-05-22 LAB — BASIC METABOLIC PANEL
BUN: 13 mg/dL (ref 7–25)
CO2: 29 mmol/L (ref 20–32)
Calcium: 9.8 mg/dL (ref 8.6–10.3)
Chloride: 101 mmol/L (ref 98–110)
Creat: 0.9 mg/dL (ref 0.70–1.33)
Glucose, Bld: 114 mg/dL — ABNORMAL HIGH (ref 65–99)
Potassium: 4 mmol/L (ref 3.5–5.3)
Sodium: 139 mmol/L (ref 135–146)

## 2019-05-22 LAB — URINE CYTOLOGY ANCILLARY ONLY
Chlamydia: NEGATIVE
Neisseria Gonorrhea: NEGATIVE

## 2019-05-22 LAB — RPR: RPR Ser Ql: NONREACTIVE

## 2019-05-22 LAB — HIV ANTIBODY (ROUTINE TESTING W REFLEX): HIV 1&2 Ab, 4th Generation: NONREACTIVE

## 2019-05-22 MED ORDER — DESCOVY 200-25 MG PO TABS: 1.0000 | ORAL_TABLET | Freq: Every day | ORAL | 2 refills | Status: DC

## 2019-05-22 NOTE — Progress Notes (Signed)
Patient's HIV antibody is negative.  Will send in 3 more months of Descovy to [place].

## 2019-05-23 MED FILL — LISINOPRIL-HCTZ 20-25 MG TA: 20-25 | 30 days supply | Qty: 30 | Fill #0

## 2019-06-09 ENCOUNTER — Encounter: Payer: Self-pay | Admitting: Pharmacist

## 2019-07-07 ENCOUNTER — Telehealth: Payer: Self-pay | Admitting: Family Medicine

## 2019-07-07 ENCOUNTER — Other Ambulatory Visit: Payer: Self-pay

## 2019-07-07 MED ORDER — LISINOPRIL-HYDROCHLOROTHIAZIDE 20-25 MG PO TABS: 1.0000 | ORAL_TABLET | Freq: Every day | ORAL | 0 refills | Status: DC

## 2019-07-07 MED FILL — LISINOPRIL-HCTZ 20-25 MG TA: 20-25 | 30 days supply | Qty: 30 | Fill #0

## 2019-07-07 NOTE — Telephone Encounter (Signed)
I rescheduled the patient til Tuesday 10/27  He was wanting to know if you can send in a refill on his Rx   lisinopril-hydrochlorothiazide (ZESTORETIC) 20-25 MG tablet

## 2019-07-08 ENCOUNTER — Ambulatory Visit: Payer: BLUE CROSS/BLUE SHIELD | Admitting: Family Medicine

## 2019-07-15 ENCOUNTER — Encounter: Payer: Self-pay | Admitting: Family Medicine

## 2019-07-15 ENCOUNTER — Ambulatory Visit (INDEPENDENT_AMBULATORY_CARE_PROVIDER_SITE_OTHER): Payer: BLUE CROSS/BLUE SHIELD | Admitting: Family Medicine

## 2019-07-15 ENCOUNTER — Other Ambulatory Visit: Payer: Self-pay

## 2019-07-15 VITALS — BP 122/82 | HR 77 | Temp 98.8°F | Ht 71.0 in | Wt 362.2 lb

## 2019-07-15 DIAGNOSIS — G4733 Obstructive sleep apnea (adult) (pediatric): Secondary | ICD-10-CM | POA: Diagnosis not present

## 2019-07-15 DIAGNOSIS — R739 Hyperglycemia, unspecified: Secondary | ICD-10-CM | POA: Diagnosis not present

## 2019-07-15 DIAGNOSIS — Z23 Encounter for immunization: Secondary | ICD-10-CM | POA: Diagnosis not present

## 2019-07-15 DIAGNOSIS — Z0000 Encounter for general adult medical examination without abnormal findings: Secondary | ICD-10-CM | POA: Diagnosis not present

## 2019-07-15 LAB — BASIC METABOLIC PANEL
BUN: 13 mg/dL (ref 6–23)
CO2: 31 mEq/L (ref 19–32)
Calcium: 9.4 mg/dL (ref 8.4–10.5)
Chloride: 102 mEq/L (ref 96–112)
Creatinine, Ser: 0.73 mg/dL (ref 0.40–1.50)
GFR: 111.98 mL/min (ref >60.00)
Glucose, Bld: 96 mg/dL (ref 70–99)
Potassium: 4.3 mEq/L (ref 3.5–5.1)
Sodium: 139 mEq/L (ref 135–145)

## 2019-07-15 LAB — LIPID PANEL
Cholesterol: 194 mg/dL (ref 0–200)
HDL: 29.4 mg/dL — ABNORMAL LOW (ref >39.00)
LDL Cholesterol: 132 mg/dL — ABNORMAL HIGH (ref 0–99)
NonHDL: 164.33
Total CHOL/HDL Ratio: 7
Triglycerides: 160 mg/dL — ABNORMAL HIGH (ref 0.0–149.0)
VLDL: 32 mg/dL (ref 0.0–40.0)

## 2019-07-15 LAB — POC URINALSYSI DIPSTICK (AUTOMATED)
Glucose, UA: NEGATIVE
Leukocytes, UA: NEGATIVE
Protein, UA: NEGATIVE
Spec Grav, UA: 1.02 (ref 1.010–1.025)
Urobilinogen, UA: 0.2 E.U./dL
pH, UA: 6 (ref 5.0–8.0)

## 2019-07-15 LAB — CBC WITH DIFFERENTIAL/PLATELET
Basophils Absolute: 0.1 10*3/uL (ref 0.0–0.1)
Basophils Relative: 0.9 % (ref 0.0–3.0)
Eosinophils Absolute: 0.1 10*3/uL (ref 0.0–0.7)
Eosinophils Relative: 1.9 % (ref 0.0–5.0)
HCT: 52.6 % — ABNORMAL HIGH (ref 39.0–52.0)
Hemoglobin: 17.9 g/dL — ABNORMAL HIGH (ref 13.0–17.0)
Lymphocytes Relative: 33.1 % (ref 12.0–46.0)
Lymphs Abs: 2.4 10*3/uL (ref 0.7–4.0)
MCHC: 33.9 g/dL (ref 30.0–36.0)
MCV: 98.8 fl (ref 78.0–100.0)
Monocytes Absolute: 1.2 10*3/uL — ABNORMAL HIGH (ref 0.1–1.0)
Monocytes Relative: 16.4 % — ABNORMAL HIGH (ref 3.0–12.0)
Neutro Abs: 3.5 10*3/uL (ref 1.4–7.7)
Neutrophils Relative %: 47.7 % (ref 43.0–77.0)
Platelets: 206 10*3/uL (ref 150.0–400.0)
RBC: 5.33 Mil/uL (ref 4.22–5.81)
RDW: 13.7 % (ref 11.5–15.5)
WBC: 7.4 10*3/uL (ref 4.0–10.5)

## 2019-07-15 LAB — HEPATIC FUNCTION PANEL
ALT: 24 U/L (ref 0–53)
AST: 22 U/L (ref 0–37)
Albumin: 4.3 g/dL (ref 3.5–5.2)
Alkaline Phosphatase: 74 U/L (ref 39–117)
Bilirubin, Direct: 0.1 mg/dL (ref 0.0–0.3)
Total Bilirubin: 0.5 mg/dL (ref 0.2–1.2)
Total Protein: 6.8 g/dL (ref 6.0–8.3)

## 2019-07-15 LAB — HEMOGLOBIN A1C: Hgb A1c MFr Bld: 5.7 % (ref 4.6–6.5)

## 2019-07-15 MED ORDER — LISINOPRIL-HYDROCHLOROTHIAZIDE 20-25 MG PO TABS: 1.0000 | ORAL_TABLET | Freq: Every day | ORAL | 3 refills | Status: DC

## 2019-07-15 NOTE — Progress Notes (Signed)
Subjective:    Patient ID: Jeremy Guerrero, male    DOB: [birth year], 53 y.o.   MRN: [id number]  HPI Here for a well exam. He is doing well. He has recently switched to [place] for HIV prophylaxis and after some early GI issues, he has adjusted well. His BP is stable. His last sleep study was in 2015 and he thinks he needs a new one to replace his CPAP equipment.    Review of Systems  Constitutional: Negative.   HENT: Negative.   Eyes: Negative.   Respiratory: Negative.   Cardiovascular: Negative.   Gastrointestinal: Negative.   Genitourinary: Negative.   Musculoskeletal: Negative.   Skin: Negative.   Neurological: Negative.   Psychiatric/Behavioral: Negative.        Objective:   Physical Exam Constitutional:      General: He is not in acute distress.    Appearance: He is well-developed. He is not diaphoretic.  HENT:     Head: Normocephalic and atraumatic.     Right Ear: External ear normal.     Left Ear: External ear normal.     Nose: Nose normal.     Mouth/Throat:     Pharynx: No oropharyngeal exudate.  Eyes:     General: No scleral icterus.       Right eye: No discharge.        Left eye: No discharge.     Conjunctiva/sclera: Conjunctivae normal.     Pupils: Pupils are equal, round, and reactive to light.  Neck:     Musculoskeletal: Neck supple.     Thyroid: No thyromegaly.     Vascular: No JVD.     Trachea: No tracheal deviation.  Cardiovascular:     Rate and Rhythm: Normal rate and regular rhythm.     Heart sounds: Normal heart sounds. No murmur. No friction rub. No gallop.   Pulmonary:     Effort: Pulmonary effort is normal. No respiratory distress.     Breath sounds: Normal breath sounds. No wheezing or rales.  Chest:     Chest wall: No tenderness.  Abdominal:     General: Bowel sounds are normal. There is no distension.     Palpations: Abdomen is soft. There is no mass.     Tenderness: There is no abdominal tenderness. There is no guarding or rebound.   Genitourinary:    Penis: Normal. No tenderness.      Scrotum/Testes: Normal.     Prostate: Normal.     Rectum: Normal. Guaiac result negative.  Musculoskeletal: Normal range of motion.        General: No tenderness.  Lymphadenopathy:     Cervical: No cervical adenopathy.  Skin:    General: Skin is warm and dry.     Coloration: Skin is not pale.     Findings: No erythema or rash.  Neurological:     Mental Status: He is alert and oriented to person, place, and time.     Cranial Nerves: No cranial nerve deficit.     Motor: No abnormal muscle tone.     Coordination: Coordination normal.     Deep Tendon Reflexes: Reflexes are normal and symmetric. Reflexes normal.  Psychiatric:        Behavior: Behavior normal.        Thought Content: Thought content normal.        Judgment: Judgment normal.           Assessment & Plan:  Well exam. We discussed diet and  exercise. Get fasting labs. Refer to Dr. Elsworth Soho for sleep apnea. Alysia Penna, MD

## 2019-07-17 LAB — TSH: TSH: 1.57 u[IU]/mL (ref 0.35–4.50)

## 2019-07-17 LAB — PSA: PSA: 1.39 ng/mL (ref 0.10–4.00)

## 2019-08-05 ENCOUNTER — Encounter (HOSPITAL_COMMUNITY): Payer: Self-pay | Admitting: Pharmacy Technician

## 2019-08-05 ENCOUNTER — Inpatient Hospital Stay (HOSPITAL_COMMUNITY)
Admission: EM | Admit: 2019-08-05 | Discharge: 2019-08-07 | DRG: 247 | Disposition: A | Discharge status: Home / Self Care | Payer: BLUE CROSS/BLUE SHIELD | Source: Other Acute Inpatient Hospital | Attending: Interventional Cardiology | Admitting: Interventional Cardiology

## 2019-08-05 ENCOUNTER — Ambulatory Visit (HOSPITAL_COMMUNITY)
Admission: AD | Admit: 2019-08-05 | Payer: BLUE CROSS/BLUE SHIELD | Source: Other Acute Inpatient Hospital | Admitting: Interventional Cardiology

## 2019-08-05 ENCOUNTER — Other Ambulatory Visit: Payer: Self-pay

## 2019-08-05 ENCOUNTER — Encounter (HOSPITAL_COMMUNITY)
Admission: EM | Disposition: A | Discharge status: Home / Self Care | Payer: Self-pay | Source: Other Acute Inpatient Hospital | Attending: Interventional Cardiology

## 2019-08-05 ENCOUNTER — Emergency Department (HOSPITAL_COMMUNITY): Payer: BLUE CROSS/BLUE SHIELD

## 2019-08-05 DIAGNOSIS — I10 Essential (primary) hypertension: Secondary | ICD-10-CM | POA: Diagnosis present

## 2019-08-05 DIAGNOSIS — Y840 Cardiac catheterization as the cause of abnormal reaction of the patient, or of later complication, without mention of misadventure at the time of the procedure: Secondary | ICD-10-CM | POA: Diagnosis not present

## 2019-08-05 DIAGNOSIS — Y713 Surgical instruments, materials and cardiovascular devices (including sutures) associated with adverse incidents: Secondary | ICD-10-CM | POA: Diagnosis not present

## 2019-08-05 DIAGNOSIS — Z87891 Personal history of nicotine dependence: Secondary | ICD-10-CM | POA: Diagnosis not present

## 2019-08-05 DIAGNOSIS — E785 Hyperlipidemia, unspecified: Secondary | ICD-10-CM

## 2019-08-05 DIAGNOSIS — Z8249 Family history of ischemic heart disease and other diseases of the circulatory system: Secondary | ICD-10-CM | POA: Diagnosis not present

## 2019-08-05 DIAGNOSIS — Z79899 Other long term (current) drug therapy: Secondary | ICD-10-CM

## 2019-08-05 DIAGNOSIS — Z833 Family history of diabetes mellitus: Secondary | ICD-10-CM

## 2019-08-05 DIAGNOSIS — Z95820 Peripheral vascular angioplasty status with implants and grafts: Secondary | ICD-10-CM

## 2019-08-05 DIAGNOSIS — R9431 Abnormal electrocardiogram [ECG] [EKG]: Secondary | ICD-10-CM | POA: Diagnosis not present

## 2019-08-05 DIAGNOSIS — I214 Non-ST elevation (NSTEMI) myocardial infarction: Secondary | ICD-10-CM

## 2019-08-05 DIAGNOSIS — Y92230 Patient room in hospital as the place of occurrence of the external cause: Secondary | ICD-10-CM | POA: Diagnosis not present

## 2019-08-05 DIAGNOSIS — G4733 Obstructive sleep apnea (adult) (pediatric): Secondary | ICD-10-CM

## 2019-08-05 DIAGNOSIS — Z6841 Body Mass Index (BMI) 40.0 and over, adult: Secondary | ICD-10-CM

## 2019-08-05 DIAGNOSIS — I219 Acute myocardial infarction, unspecified: Secondary | ICD-10-CM | POA: Diagnosis not present

## 2019-08-05 DIAGNOSIS — I472 Ventricular tachycardia: Secondary | ICD-10-CM | POA: Diagnosis not present

## 2019-08-05 DIAGNOSIS — R079 Chest pain, unspecified: Secondary | ICD-10-CM | POA: Diagnosis not present

## 2019-08-05 DIAGNOSIS — Z20828 Contact with and (suspected) exposure to other viral communicable diseases: Secondary | ICD-10-CM | POA: Diagnosis present

## 2019-08-05 DIAGNOSIS — I2510 Atherosclerotic heart disease of native coronary artery without angina pectoris: Secondary | ICD-10-CM | POA: Diagnosis not present

## 2019-08-05 DIAGNOSIS — Z955 Presence of coronary angioplasty implant and graft: Secondary | ICD-10-CM

## 2019-08-05 DIAGNOSIS — I25118 Atherosclerotic heart disease of native coronary artery with other forms of angina pectoris: Secondary | ICD-10-CM | POA: Diagnosis not present

## 2019-08-05 DIAGNOSIS — L7632 Postprocedural hematoma of skin and subcutaneous tissue following other procedure: Secondary | ICD-10-CM | POA: Diagnosis not present

## 2019-08-05 DIAGNOSIS — I25110 Atherosclerotic heart disease of native coronary artery with unstable angina pectoris: Secondary | ICD-10-CM | POA: Diagnosis not present

## 2019-08-05 HISTORY — DX: Non-ST elevation (NSTEMI) myocardial infarction: I21.4

## 2019-08-05 HISTORY — PX: LEFT HEART CATH AND CORONARY ANGIOGRAPHY: CATH118249

## 2019-08-05 LAB — BASIC METABOLIC PANEL
Anion gap: 13 (ref 5–15)
BUN: 15 mg/dL (ref 6–20)
CO2: 23 mmol/L (ref 22–32)
Calcium: 9.1 mg/dL (ref 8.9–10.3)
Chloride: 101 mmol/L (ref 98–111)
Creatinine, Ser: 0.77 mg/dL (ref 0.61–1.24)
GFR calc Af Amer: >60 mL/min (ref >60)
GFR calc non Af Amer: >60 mL/min (ref >60)
Glucose, Bld: 95 mg/dL (ref 70–99)
Potassium: 4.1 mmol/L (ref 3.5–5.1)
Sodium: 137 mmol/L (ref 135–145)

## 2019-08-05 LAB — CBC
HCT: 52.3 % — ABNORMAL HIGH (ref 39.0–52.0)
Hemoglobin: 17.6 g/dL — ABNORMAL HIGH (ref 13.0–17.0)
MCH: 33.6 pg (ref 26.0–34.0)
MCHC: 33.7 g/dL (ref 30.0–36.0)
MCV: 99.8 fL (ref 80.0–100.0)
Platelets: 218 10*3/uL (ref 150–400)
RBC: 5.24 MIL/uL (ref 4.22–5.81)
RDW: 12.9 % (ref 11.5–15.5)
WBC: 13.6 10*3/uL — ABNORMAL HIGH (ref 4.0–10.5)
nRBC: 0 % (ref 0.0–0.2)

## 2019-08-05 LAB — SARS CORONAVIRUS 2 BY RT PCR (HOSPITAL ORDER, PERFORMED IN ~~LOC~~ HOSPITAL LAB): SARS Coronavirus 2: NEGATIVE

## 2019-08-05 LAB — TROPONIN I (HIGH SENSITIVITY)
Troponin I (High Sensitivity): 1977 ng/L (ref <18)
Troponin I (High Sensitivity): 4674 ng/L (ref <18)

## 2019-08-05 LAB — CARDIAC CATHETERIZATION: Cath EF Quantitative: 60 %

## 2019-08-05 SURGERY — LEFT HEART CATH AND CORONARY ANGIOGRAPHY
Anesthesia: LOCAL

## 2019-08-05 MED ORDER — ASPIRIN 81 MG PO CHEW
81.0000 mg | CHEWABLE_TABLET | Freq: Every day | ORAL | Status: DC
  Administered 2019-08-06 – 2019-08-07 (×2): 81 mg via ORAL
  Filled 2019-08-05 (×2): qty 1

## 2019-08-05 MED ORDER — SODIUM CHLORIDE 0.9% FLUSH: 3.0000 mL | INTRAVENOUS | Status: DC | PRN

## 2019-08-05 MED ORDER — LABETALOL HCL 5 MG/ML IV SOLN: 10.0000 mg | INTRAVENOUS | Status: AC | PRN

## 2019-08-05 MED ORDER — TIROFIBAN HCL IN NACL 5-0.9 MG/100ML-% IV SOLN
INTRAVENOUS | Status: AC | PRN
  Administered 2019-08-05: 0.15 ug/kg/min via INTRAVENOUS

## 2019-08-05 MED ORDER — FENTANYL CITRATE (PF) 100 MCG/2ML IJ SOLN
INTRAMUSCULAR | Status: DC | PRN
  Administered 2019-08-05: 25 ug via INTRAVENOUS

## 2019-08-05 MED ORDER — OXYCODONE HCL 5 MG PO TABS: 5.0000 mg | ORAL_TABLET | ORAL | Status: DC | PRN

## 2019-08-05 MED ORDER — HEPARIN SODIUM (PORCINE) 1000 UNIT/ML IJ SOLN
INTRAMUSCULAR | Status: DC | PRN
  Administered 2019-08-05: 7000 [IU] via INTRAVENOUS

## 2019-08-05 MED ORDER — ONDANSETRON HCL 4 MG/2ML IJ SOLN: 4.0000 mg | Freq: Four times a day (QID) | INTRAMUSCULAR | Status: DC | PRN

## 2019-08-05 MED ORDER — TICAGRELOR 90 MG PO TABS
ORAL_TABLET | ORAL | Status: DC | PRN
  Administered 2019-08-05: 180 mg via ORAL

## 2019-08-05 MED ORDER — TIROFIBAN HCL IN NACL 5-0.9 MG/100ML-% IV SOLN
INTRAVENOUS | Status: AC
  Filled 2019-08-05: qty 200

## 2019-08-05 MED ORDER — FENTANYL CITRATE (PF) 100 MCG/2ML IJ SOLN
INTRAMUSCULAR | Status: AC
  Filled 2019-08-05: qty 2

## 2019-08-05 MED ORDER — HEPARIN (PORCINE) IN NACL 1000-0.9 UT/500ML-% IV SOLN
INTRAVENOUS | Status: AC
  Filled 2019-08-05: qty 1000

## 2019-08-05 MED ORDER — SODIUM CHLORIDE 0.9 % WEIGHT BASED INFUSION
1.0000 mL/kg/h | INTRAVENOUS | Status: AC
  Administered 2019-08-05: 19:00:00 1 mL/kg/h via INTRAVENOUS

## 2019-08-05 MED ORDER — SODIUM CHLORIDE 0.9 % WEIGHT BASED INFUSION
1.0000 mL/kg/h | INTRAVENOUS | Status: DC
  Administered 2019-08-06: 1 mL/kg/h via INTRAVENOUS
  Administered 2019-08-06: 10:00:00 200 mL/h via INTRAVENOUS

## 2019-08-05 MED ORDER — ATORVASTATIN CALCIUM 80 MG PO TABS: 80.0000 mg | ORAL_TABLET | Freq: Every day | ORAL | Status: DC

## 2019-08-05 MED ORDER — HEPARIN SODIUM (PORCINE) 1000 UNIT/ML IJ SOLN
INTRAMUSCULAR | Status: AC
  Filled 2019-08-05: qty 1

## 2019-08-05 MED ORDER — LIDOCAINE HCL (PF) 1 % IJ SOLN
INTRAMUSCULAR | Status: AC
  Filled 2019-08-05: qty 30

## 2019-08-05 MED ORDER — VERAPAMIL HCL 2.5 MG/ML IV SOLN
INTRAVENOUS | Status: AC
  Filled 2019-08-05: qty 2

## 2019-08-05 MED ORDER — MIDAZOLAM HCL 2 MG/2ML IJ SOLN
INTRAMUSCULAR | Status: DC | PRN
  Administered 2019-08-05: 1 mg via INTRAVENOUS

## 2019-08-05 MED ORDER — HEPARIN (PORCINE) IN NACL 1000-0.9 UT/500ML-% IV SOLN
INTRAVENOUS | Status: DC | PRN
  Administered 2019-08-05 (×2): 500 mL

## 2019-08-05 MED ORDER — SODIUM CHLORIDE 0.9% FLUSH
3.0000 mL | Freq: Two times a day (BID) | INTRAVENOUS | Status: DC
  Administered 2019-08-07: 10:00:00 3 mL via INTRAVENOUS

## 2019-08-05 MED ORDER — IOHEXOL 350 MG/ML SOLN
INTRAVENOUS | Status: DC | PRN
  Administered 2019-08-05: 75 mL

## 2019-08-05 MED ORDER — TIROFIBAN (AGGRASTAT) BOLUS VIA INFUSION
INTRAVENOUS | Status: DC | PRN
  Administered 2019-08-05: 3855 ug via INTRAVENOUS

## 2019-08-05 MED ORDER — SODIUM CHLORIDE 0.9 % IV SOLN: 250.0000 mL | INTRAVENOUS | Status: DC | PRN

## 2019-08-05 MED ORDER — TIROFIBAN HCL IN NACL 5-0.9 MG/100ML-% IV SOLN
22.9200 ug/min | INTRAVENOUS | Status: DC
  Administered 2019-08-06 (×3): 22.92 ug/min via INTRAVENOUS
  Filled 2019-08-05: qty 250
  Filled 2019-08-05: qty 100
  Filled 2019-08-05: qty 250
  Filled 2019-08-05 (×2): qty 100
  Filled 2019-08-05: qty 250
  Filled 2019-08-05 (×2): qty 100

## 2019-08-05 MED ORDER — HEPARIN (PORCINE) 25000 UT/250ML-% IV SOLN
1000.0000 [IU]/h | INTRAVENOUS | Status: DC
  Administered 2019-08-06: 03:00:00 1000 [IU]/h via INTRAVENOUS
  Filled 2019-08-05: qty 250

## 2019-08-05 MED ORDER — LIDOCAINE HCL (PF) 1 % IJ SOLN
INTRAMUSCULAR | Status: DC | PRN
  Administered 2019-08-05: 5 mL

## 2019-08-05 MED ORDER — SODIUM CHLORIDE 0.9 % WEIGHT BASED INFUSION: 3.0000 mL/kg/h | INTRAVENOUS | Status: AC

## 2019-08-05 MED ORDER — ACETAMINOPHEN 325 MG PO TABS: 650.0000 mg | ORAL_TABLET | ORAL | Status: DC | PRN

## 2019-08-05 MED ORDER — HEPARIN SODIUM (PORCINE) 5000 UNIT/ML IJ SOLN: 5000.0000 [IU] | Freq: Three times a day (TID) | INTRAMUSCULAR | Status: DC

## 2019-08-05 MED ORDER — SODIUM CHLORIDE 0.9% FLUSH: 3.0000 mL | Freq: Two times a day (BID) | INTRAVENOUS | Status: DC

## 2019-08-05 MED ORDER — TICAGRELOR 90 MG PO TABS: 90.0000 mg | ORAL_TABLET | Freq: Two times a day (BID) | ORAL | Status: DC

## 2019-08-05 MED ORDER — TICAGRELOR 90 MG PO TABS
ORAL_TABLET | ORAL | Status: AC
  Filled 2019-08-05: qty 2

## 2019-08-05 MED ORDER — VERAPAMIL HCL 2.5 MG/ML IV SOLN
INTRAVENOUS | Status: DC | PRN
  Administered 2019-08-05: 10 mL via INTRA_ARTERIAL

## 2019-08-05 MED ORDER — HYDRALAZINE HCL 20 MG/ML IJ SOLN: 10.0000 mg | INTRAMUSCULAR | Status: AC | PRN

## 2019-08-05 MED ORDER — MIDAZOLAM HCL 2 MG/2ML IJ SOLN
INTRAMUSCULAR | Status: AC
  Filled 2019-08-05: qty 2

## 2019-08-05 MED ORDER — TICAGRELOR 90 MG PO TABS
90.0000 mg | ORAL_TABLET | Freq: Two times a day (BID) | ORAL | Status: DC
  Administered 2019-08-06 – 2019-08-07 (×3): 90 mg via ORAL
  Filled 2019-08-05 (×3): qty 1

## 2019-08-05 SURGICAL SUPPLY — 11 items
CATH 5FR JL3.5 JR4 ANG PIG MP (CATHETERS) ×1 IMPLANT
DEVICE RAD COMP TR BAND LRG (VASCULAR PRODUCTS) ×2 IMPLANT
ELECT DEFIB PAD ADLT CADENCE (PAD) ×2 IMPLANT
GLIDESHEATH SLEND A-KIT 6F 22G (SHEATH) ×1 IMPLANT
GUIDEWIRE INQWIRE 1.5J.035X260 (WIRE) ×1 IMPLANT
INQWIRE 1.5J .035X260CM (WIRE) ×2
KIT HEART LEFT (KITS) ×2 IMPLANT
PACK CARDIAC CATHETERIZATION (CUSTOM PROCEDURE TRAY) ×2 IMPLANT
SHEATH PROBE COVER 6X72 (BAG) ×2 IMPLANT
TRANSDUCER W/STOPCOCK (MISCELLANEOUS) ×2 IMPLANT
TUBING CIL FLEX 10 FLL-RA (TUBING) ×2 IMPLANT

## 2019-08-05 NOTE — ED Provider Notes (Signed)
Care transferred from [place] at shift change. See note for full HPI.  In summation 53 year old presents for CP. Sent from [facility] with concern for NSTEMI. Patient given aspirin and Heparin. CP free.  Previous provider discussed with Cardiology who will evaluate patient. Physical Exam  BP 105/69   Pulse 65   Temp 98.4 F (36.9 C) (Oral)   Resp (!) 21   Wt (!) 154.2 kg   SpO2 98%   BMI 47.42 kg/m   Physical Exam Vitals signs and nursing note reviewed.  Constitutional:      General: He is not in acute distress.    Appearance: He is well-developed. He is not diaphoretic.  HENT:     Head: Atraumatic.  Eyes:     Pupils: Pupils are equal, round, and reactive to light.  Neck:     Musculoskeletal: Normal range of motion and neck supple.  Cardiovascular:     Rate and Rhythm: Normal rate and regular rhythm.  Pulmonary:     Effort: Pulmonary effort is normal. No respiratory distress.  Abdominal:     General: There is no distension.     Palpations: Abdomen is soft.  Musculoskeletal: Normal range of motion.  Skin:    General: Skin is warm and dry.  Neurological:     Mental Status: He is alert.     ED Course/Procedures   Clinical Course as of Aug 05 1655  Tue Aug 05, 2019  1547 Trish from cardiology reports PA in route to assess.   [BM]    Clinical Course User Index [BM] Deliah Boston, PA-C    Procedures Labs Reviewed  CBC - Abnormal; Notable for the following components:      Result Value   WBC 13.6 (*)    Hemoglobin 17.6 (*)    HCT 52.3 (*)    All other components within normal limits  SARS CORONAVIRUS 2 BY RT PCR (HOSPITAL ORDER, [place] [facility])  BASIC METABOLIC PANEL  TROPONIN I (HIGH SENSITIVITY)  No results found.  MDM  Care trasnfered from previous provider at shift change. Patient sent from [facility] with concern for NSTEMI. Patient given aspirin and Heparin. Previous provider spoke with Cardiology who will evaluate  patient.  Cardiology recommends continued Heparin and to cath lab.  The patient appears reasonably stabilized for admission considering the current resources, flow, and capabilities available in the ED at this time, and I doubt any other [facility] requiring further screening and/or treatment in the ED prior to admission.      Tashai Catino A, PA-C 08/05/19 1656    Isla Pence, MD 08/05/19 1657

## 2019-08-05 NOTE — ED Triage Notes (Signed)
Pt bib [facility] from [facility] with reports of sudden onset cp today at approx 0600. Pt initial troponin 0.0 with repeat being 0.06. given aspirin and heparin pta. Heparin currently going at 1000u/hour.

## 2019-08-05 NOTE — Progress Notes (Addendum)
Called by RN regarding forearm hematoma.  They already had a blood pressure cuff in place.  Advised them to inflate the blood pressure cuff to 5-10 mm meters of mercury below his diastolic pressure. Keep an eye on capillary refill to make sure he still has adequate circulation to his hand. Continue TR band protocol for deflation and removal. Generally the blood pressure cuff in place for at least 30 minutes and check.  Rosaria Ferries, PA-C 08/05/2019 7:20 PM Beeper [phone]  Came to check on the patient. His arm is not as painful as it was when the hematoma was first discovered. Capillary refill is within normal limits. I took down the blood pressure cuff and palpated the hematoma.  It is still easily palpable.  I marked the edges.  I reinflated the blood pressure cuff to 10 points below his diastolic pressure.  I updated the nurse, she will continue to monitor.  Rosaria Ferries, PA-C 08/05/2019 8:07 PM Beeper [phone]

## 2019-08-05 NOTE — Progress Notes (Signed)
Shortly after arrival from cath lab, pt began to develop hematoma approx 5 inches above radial site. Pressure was immediately applied to area and provider notified.

## 2019-08-05 NOTE — ED Provider Notes (Signed)
[facility] EMERGENCY DEPARTMENT Provider Note   CSN: [id number] Arrival date & time: 08/05/19  1517     History   Chief Complaint Chief Complaint  Patient presents with   Chest Pain    HPI Jeremy Guerrero is a 53 y.o. male.  Patient reports onset of chest pain around 6 AM today while driving to [facility]  Patient reports pain worsened throughout the day, he eventually returned from [place] to aid in [place] where he was evaluated at [facility] emergency department.  There they noted subtle ST elevations in inferior leads and troponin elevated from 0.00-0.06, patient was given aspirin and started on IV heparin sent to this ED for cardiology evaluation.  Physical documents from [facility] show a spoke with Dr. Irish Lack from cardiology.  Patient reports that he is chest pain-free on my evaluation.  Patient reports nausea, dry heaving and diaphoresis associated with his chest pain.    HPI  Past Medical History:  Diagnosis Date   History of adenomatous polyp of colon 06/02/2016   Hyperlipidemia    Hypertension    Sciatica    Sleep apnea    wears cpap    Patient Active Problem List   Diagnosis Date Noted   History of adenomatous polyp of colon 06/02/2016   High risk sexual behavior 06/02/2016   Hyperglycemia 02/09/2016   HTN (hypertension) 01/12/2016   Morbid obesity ([place]) 01/12/2016   Knee pain, bilateral 03/24/2014   OSA (obstructive sleep apnea) 10/30/2013   Eczema 10/28/2013   BACK PAIN 06/22/2010   HYPERLIPIDEMIA 06/14/2010   SCIATICA, RIGHT 07/23/2007   LIBIDO, DECREASED 04/26/2007    Past Surgical History:  Procedure Laterality Date   COLONOSCOPY WITH PROPOFOL N/A 05/30/2016   per Dr. Carlean Purl, adenomatous polyps, repeat in 7 yrs    KNEE SURGERY Left 2015   torn meniscus        Home Medications    Prior to Admission medications   Medication Sig Start Date End Date Taking?  Authorizing Provider  emtricitabine-tenofovir AF (DESCOVY) 200-25 MG tablet Take 1 tablet by mouth daily. 05/22/19   Kuppelweiser, Cassie L, RPH-CPP  lisinopril-hydrochlorothiazide (ZESTORETIC) 20-25 MG tablet Take 1 tablet by mouth daily. 07/15/19   Laurey Morale, MD    Family History Family History  Problem Relation Age of Onset   Healthy Sister    Healthy Brother    Ovarian cancer Sister    Healthy Sister    Healthy Mother    Diabetes Father    Diabetes Maternal Grandfather    Heart attack Maternal Grandfather    Heart attack Other    Cancer Other        throat   Colon cancer Neg Hx    Colon polyps Neg Hx    Esophageal cancer Neg Hx    Rectal cancer Neg Hx    Stomach cancer Neg Hx     Social History Social History   Tobacco Use   Smoking status: Former Smoker    Packs/day: 1.00    Years: 30.00    Pack years: 30.00    Types: Cigarettes    Quit date: 11/27/2013    Years since quitting: 5.6   Smokeless tobacco: Never Used  Substance Use Topics   Alcohol use: Yes    Alcohol/week: 0.0 standard drinks    Comment: 6 beers a month   Drug use: No     Allergies   Patient has no known allergies.   Review of  Systems Review of Systems Ten systems are reviewed and are negative for acute change except as noted in the HPI   Physical Exam Updated Vital Signs BP (!) 135/105    Pulse 72    Resp (!) 22    Wt (!) 154.2 kg    SpO2 98%    BMI 47.42 kg/m   Physical Exam Constitutional:      General: He is not in acute distress.    Appearance: Normal appearance. He is well-developed. He is obese. He is not ill-appearing or diaphoretic.     Comments: Morbidly obese  HENT:     Head: Normocephalic and atraumatic.     Right Ear: External ear normal.     Left Ear: External ear normal.     Nose: Nose normal.  Eyes:     General: Vision grossly intact. Gaze aligned appropriately.     Pupils: Pupils are equal, round, and reactive to light.  Neck:      Musculoskeletal: Normal range of motion.     Trachea: Trachea and phonation normal. No tracheal deviation.  Cardiovascular:     Rate and Rhythm: Normal rate and regular rhythm.  Pulmonary:     Effort: Pulmonary effort is normal. No respiratory distress.  Chest:     Chest wall: No tenderness.  Abdominal:     General: There is no distension.     Palpations: Abdomen is soft.     Tenderness: There is no abdominal tenderness. There is no guarding or rebound.  Musculoskeletal: Normal range of motion.  Skin:    General: Skin is warm and dry.  Neurological:     Mental Status: He is alert.     GCS: GCS eye subscore is 4. GCS verbal subscore is 5. GCS motor subscore is 6.     Comments: Speech is clear and goal oriented, follows commands Major Cranial nerves without deficit, no facial droop Moves extremities without ataxia, coordination intact  Psychiatric:        Behavior: Behavior normal.      ED Treatments / Results  Labs (all labs ordered are listed, but only abnormal results are displayed) Labs Reviewed  SARS CORONAVIRUS 2 BY RT PCR [facility] ORDER, [place] [facility])    EKG EKG Interpretation  Date/Time:  Tuesday August 05 2019 15:25:20 EST Ventricular Rate:  76 PR Interval:    QRS Duration: 85 QT Interval:  370 QTC Calculation: 416 R Axis:   6 Text Interpretation: Sinus rhythm Ventricular premature complex Low voltage, precordial leads Confirmed by Sherwood Gambler [phone]) on 08/05/2019 3:39:13 PM   Radiology No results found.  Procedures Procedures (including critical care time)  Medications Ordered in ED Medications - No data to display   Initial Impression / Assessment and Plan / ED Course  I have reviewed the triage vital signs and the nursing notes.  Pertinent labs & imaging results that were available during my care of the patient were reviewed by me and considered in my medical decision making (see chart for details).  Clinical  Course as of Aug 05 1547  Tue Aug 05, 2019  1547 Trish from cardiology reports PA in route to assess.   [BM]    Clinical Course User Index [BM] Deliah Boston, PA-C    Upon ED arrival I contacted cardiology and spoke with Wannetta Sender, she reports that cardiology PA is in route to assess patient.  He is resting comfortably here and is chest pain-free has already received aspirin and  heparin.  Have added basic chest pain blood work. - CBC leukocytosis 13.6 EKG: Sinus rhythm Ventricular premature complex Low voltage, precordial leads Confirmed by Sherwood Gambler [phone]) on 08/05/2019 3:39:13 PM - Remainder of work-up is pending, awaiting cardiology recommendations.  Care handoff given to [place] PA-C at shift change plan of care is to await cardiology recommendations.  Trestin Morelan was evaluated in Emergency Department on 08/05/2019 for the symptoms described in the history of present illness. He was evaluated in the context of the global COVID-19 pandemic, which necessitated consideration that the patient might be at risk for infection with the SARS-CoV-2 virus that causes COVID-19. Institutional protocols and algorithms that pertain to the evaluation of patients at risk for COVID-19 are in a state of rapid change based on information released by regulatory bodies including the CDC and federal and state organizations. These policies and algorithms were followed during the patient's care in the ED.   Note: Portions of this report may have been transcribed using voice recognition software. Every effort was made to ensure accuracy; however, inadvertent computerized transcription errors may still be present. Final Clinical Impressions(s) / ED Diagnoses   Final diagnoses:  None    ED Discharge Orders    None       Gari Crown 08/05/19 1613    Sherwood Gambler, MD 08/06/19 2253

## 2019-08-05 NOTE — Progress Notes (Addendum)
ANTICOAGULATION CONSULT NOTE - Initial Consult  Pharmacy Consult for heparin  Indication: chest pain/ACS  No Known Allergies  Patient Measurements: Weight: (!) 340 lb (154.2 kg) Heparin Dosing Weight: 112kg  Vital Signs: Temp: 98.4 F (36.9 C) (11/17 1549) Temp Source: Oral (11/17 1549) BP: 105/69 (11/17 1615) Pulse Rate: 65 (11/17 1615)  Labs: Recent Labs    08/05/19 1545  HGB 17.6*  HCT 52.3*  PLT 218  CREATININE 0.77  TROPONINIHS 1,977*    Estimated Creatinine Clearance: 159.6 mL/min (by C-G formula based on SCr of 0.77 mg/dL).   Medical History: Past Medical History:  Diagnosis Date  . History of adenomatous polyp of colon 06/02/2016  . Hyperlipidemia   . Hypertension   . NSTEMI (non-ST elevated myocardial infarction) ([place]) 08/05/2019  . Sciatica   . Sleep apnea    wears cpap    Medications:  Medications Prior to Admission  Medication Sig Dispense Refill Last Dose  . ibuprofen (ADVIL) 200 MG tablet Take 600 mg by mouth every 6 (six) hours as needed for headache or mild pain.   Past Month at Unknown time  . lisinopril-hydrochlorothiazide (ZESTORETIC) 20-25 MG tablet Take 1 tablet by mouth daily. 90 tablet 3 08/05/2019 at Unknown time  . OVER THE COUNTER MEDICATION Take 2 tablets by mouth daily. Cholesterol Lowering Supplement   08/05/2019 at Unknown time  . emtricitabine-tenofovir AF (DESCOVY) 200-25 MG tablet Take 1 tablet by mouth daily. (Patient not taking: Reported on 08/05/2019) 30 tablet 2 Completed Course at Unknown time    Assessment: 53 yo male with NSTEMI s/p cath to start heparin 8 hours post sheath (removed ~ 5:45pm; TR band placed). He will also be on tirofiban until PCI on 11/18 -hg= 17.6   Goal of Therapy:  Heparin level 0.3-0.5 Monitor platelets by anticoagulation protocol: Yes   Plan:  -Start heparin at 1000 units/hr at 2am on 11/18 -Heparin level in 6 hours and daily wth CBC daily  Hildred Laser, PharmD Clinical  Pharmacist **Pharmacist phone directory can now be found on [email or web address] ([email or web address]).  Listed under [place].

## 2019-08-05 NOTE — Interval H&P Note (Signed)
History and Physical Interval Note:Cath Lab Visit (complete for each Cath Lab visit)  Clinical Evaluation Leading to the Procedure:   ACS: Yes.    Non-ACS:    Anginal Classification: CCS III  Anti-ischemic medical therapy: Maximal Therapy (2 or more classes of medications)  Non-Invasive Test Results: No non-invasive testing performed  Prior CABG: No previous CABG        08/05/2019 4:57 PM  Jeremy Guerrero  has presented today for surgery, with the diagnosis of st elevation.  The various methods of treatment have been discussed with the patient and family. After consideration of risks, benefits and other options for treatment, the patient has consented to  Procedure(s): LEFT HEART CATH AND CORONARY ANGIOGRAPHY (N/A) as a surgical intervention.  The patient's history has been reviewed, patient examined, no change in status, stable for surgery.  I have reviewed the patient's chart and labs.  Questions were answered to the patient's satisfaction.     Belva Crome III

## 2019-08-05 NOTE — H&P (Addendum)
Cardiology Admission History and Physical:   Patient ID: Jeremy Guerrero; MRN: [id number]; DOB: 10-14-1965   Admission date: 08/05/2019  Primary Care Provider: Laurey Morale, MD Primary Cardiologist: No primary care provider on file. New, f/u [facility] Primary Electrophysiologist: None    Chief Complaint:  USAP  Patient Profile:   Jeremy Guerrero is a 53 y.o. male with a history of HTN, HLD, OSA on CPAP, morbid obesity, on Descovy w/ neg HIV testing.  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 Clinb stairs routinely w/out problems.  This am, pt got up about 6, had a cup of coffee. He developed CP, aching, 7/10 (no chest wall tenderness), no SOB. Went to work, on the way, he became nauseated, no vomiting. At some point during all this, pain began radiating to his jaw.   Never had anything like this before. Had taken usual am meds.   At work, he got ibuprofen x 3, then ASA 81 mg x 3. Refused EMS, got coworker to take him to [facility]. Pain still 7/10. At [facility], got 3 more baby ASA. Pain remained 7-8/10.  His pain gradually eased off.  His initial ECG was abnormal, with mild ST elevation.  This improved on subsequent ECGs.  He reports that he did not get any nitroglycerin or morphine for the pain.  He was started on heparin.  He is still having some discomfort, but states the pain is a 1/10 or less.  No recent illnesses, no fevers/chills. No sick contacts  No lower extremity edema, no orthopnea or PND.  Some dyspnea on exertion, no change.  No palpitations, no presyncope or syncope.  He is compliant with CPAP and home medications.   Past Medical History:  Diagnosis Date   History of adenomatous polyp of colon 06/02/2016   Hyperlipidemia    Hypertension    NSTEMI (non-ST elevated myocardial infarction) ([facility]) 08/05/2019   Sciatica    Sleep apnea    wears cpap    Past  Surgical History:  Procedure Laterality Date   COLONOSCOPY WITH PROPOFOL N/A 05/30/2016   per Dr. Carlean Purl, adenomatous polyps, repeat in 7 yrs    KNEE SURGERY Left 2015   torn meniscus     Medications Prior to Admission: Prior to Admission medications   Medication Sig Start Date End Date Taking? Authorizing Provider  emtricitabine-tenofovir AF (DESCOVY) 200-25 MG tablet Take 1 tablet by mouth daily. 05/22/19   Kuppelweiser, Cassie L, RPH-CPP  lisinopril-hydrochlorothiazide (ZESTORETIC) 20-25 MG tablet Take 1 tablet by mouth daily. 07/15/19   Laurey Morale, MD     Allergies:   No Known Allergies  Social History:   Social History   Socioeconomic History   Marital status: Single    Spouse name: Not on file   Number of children: Not on file   Years of education: Not on file   Highest education level: Not on file  Occupational History   Occupation: Best boy: [employer] resource strain: Not on file   Food insecurity    Worry: Not on file    Inability: Not on file   Transportation needs    Medical: Not on file    Non-medical: Not on file  Tobacco Use   Smoking status: Former Smoker    Packs/day: 1.00    Years: 30.00    Pack years: 30.00  Types: Cigarettes    Quit date: 11/27/2013    Years since quitting: 5.6   Smokeless tobacco: Never Used  Substance and Sexual Activity   Alcohol use: Yes    Alcohol/week: 0.0 standard drinks    Comment: 6 beers a month   Drug use: No   Sexual activity: Yes    Partners: Male    Birth control/protection: None  Lifestyle   Physical activity    Days per week: Not on file    Minutes per session: Not on file   Stress: Not on file  Relationships   Social connections    Talks on phone: Not on file    Gets together: Not on file    Attends religious service: Not on file    Active member of club or organization: Not on file    Attends meetings of clubs or organizations:  Not on file    Relationship status: Not on file   Intimate partner violence    Fear of current or ex partner: Not on file    Emotionally abused: Not on file    Physically abused: Not on file    Forced sexual activity: Not on file  Other Topics Concern   Not on file  Social History Narrative   Not on file    Family History:  The patient's family history includes Cancer in his maternal uncle; Diabetes in his father and maternal grandfather; Healthy in his brother, mother, sister, and sister; Heart attack in his maternal grandfather and maternal uncle; Ovarian cancer in his sister. There is no history of Colon cancer, Colon polyps, Esophageal cancer, Rectal cancer, or Stomach cancer.   The patient He indicated that his mother is alive. He indicated that his father is deceased. He indicated that all of his three sisters are alive. He indicated that his brother is alive. He indicated that his maternal grandmother is deceased. He indicated that his maternal grandfather is deceased. He indicated that his maternal uncle is deceased. He indicated that the status of his neg hx is unknown.  ROS:  Please see the history of present illness.  All other ROS reviewed and negative.     Physical Exam/Data:   Vitals:   08/05/19 1545 08/05/19 1549 08/05/19 1600 08/05/19 1615  BP: 107/63  109/63 105/69  Pulse: 68  73 65  Resp: 18  16 (!) 21  Temp:  98.4 F (36.9 C)    TempSrc:  Oral    SpO2: 97%  98% 98%  Weight:       No intake or output data in the 24 hours ending 08/05/19 1645 Filed Weights   08/05/19 1527  Weight: (!) 154.2 kg   Body mass index is 47.42 kg/m.  General:  Well nourished, well developed, male in no acute distress HEENT: normal Lymph: no adenopathy Neck:  JVD not elevated but difficult to assess secondary to body habitus Endocrine:  No thryomegaly Vascular: No carotid bruits; 4/4 extremity pulses 2+ bilaterally  Cardiac:  normal S1, S2; RRR; no murmur, no rub or gallop    Lungs:  clear to auscultation bilaterally, no wheezing, rhonchi or rales  Abd: soft, nontender, no hepatomegaly  Ext: No edema Musculoskeletal:  No deformities, BUE and BLE strength normal and equal Skin: warm and dry  Neuro:  CNs 2-12 intact, no focal abnormalities noted Psych:  Normal affect    EKG:  The ECG was personally reviewed: 11/17 9:40 AM ECG is sinus rhythm, heart rate 72, approximately 1 mm  of inferior ST elevation.  Minimal ST depression in aVL 07/1709 50 6 AM ECG is very similar. 07/1711 14 p.m. ECG shows improvement in ST elevation, heart rate at that time 72  Relevant CV Studies:  None previously  Laboratory Data: From [facility] Sodium 132 Potassium 4.1 Chloride 96 CO2 27.7 BUN 17 Creatinine 0.77 Glucose 125  Initial troponin less than 0.01, subsequent troponin 0 0.06  From [facility]: HS troponin, BMET and Covid test pending   ChemistryNo results for input(s): NA, K, CL, CO2, GLUCOSE, BUN, CREATININE, CALCIUM, GFRNONAA, GFRAA, ANIONGAP in the last 168 hours.  No results for input(s): PROT, ALBUMIN, AST, ALT, ALKPHOS, BILITOT in the last 168 hours. Hematology Recent Labs  Lab 08/05/19 1545  WBC 13.6*  RBC 5.24  HGB 17.6*  HCT 52.3*  MCV 99.8  MCH 33.6  MCHC 33.7  RDW 12.9  PLT 218   Cardiac EnzymesNo results for input(s): TROPONINI in the last 168 hours. No results for input(s): TROPIPOC in the last 168 hours.   Lab Results  Component Value Date   CHOL 194 07/15/2019   HDL 29.40 (L) 07/15/2019   LDLCALC 132 (H) 07/15/2019   LDLDIRECT 143.2 10/28/2013   TRIG 160.0 (H) 07/15/2019   CHOLHDL 7 07/15/2019     Radiology/Studies:  Chest [facility] at [facility] showed no acute disease, chronic interstitial coarsening and bronchitic changes  Assessment and Plan:   1.  Non-STEMI: -Patient had dynamic ECG changes in the setting of chest pain -Symptoms have improved after aspirin and heparin -Systolic blood pressure 0000000, will add Coreg 3.125  mg twice daily -He has documented hyperlipidemia, but has not been on a statin, start Lipitor 40 mg daily -Check a hemoglobin A1c for hyperglycemia -Once his Covid test comes back negative, cardiac catheterization - Cardiac catheterization was discussed with the patient fully. The patient understands that risks include but are not limited to stroke (1 in 1000), death (1 in 78), kidney failure [usually temporary] (1 in 500), bleeding (1 in 200), allergic reaction [possibly serious] (1 in 200).  The patient understands and is willing to proceed.  2.  Hypertension: -His blood pressure is on the low side of normal. -Hold the HCTZ and lisinopril for now, if his EF is decreased, will try to get beta-blocker  3.  Hyperlipidemia, goal HDL now less than 70 -Start statin, Lipitor 40 mg daily  4.  OSA on CPAP -Continue CPAP while here    Principal Problem:   NSTEMI (non-ST elevated myocardial infarction) ([place]) Active Problems:   Hyperlipidemia with target LDL less than 70   OSA (obstructive sleep apnea)     For questions or updates, please contact [place] Please consult [email or web address] for contact info under Cardiology/STEMI.    Signed, Rosaria Ferries, PA-C  08/05/2019 4:45 PM   I have examined the patient and reviewed assessment and plan and discussed with patient.  Agree with above as stated.    Transient ST elevatioon inferiorly noted.  Pain is much better than what he had before going to [facility].  Cardiac catheterization was discussed with the patient fully. The patient understands that risks include but are not limited to stroke (1 in 1000), death (1 in 68), kidney failure [usually temporary] (1 in 500), bleeding (1 in 200), allergic reaction [possibly serious] (1 in 200).  The patient understands and is willing to proceed.    Plan for cath and aggressive risk factor modification.  Larae Grooms

## 2019-08-05 NOTE — Progress Notes (Signed)
Patient is on NIV at this time tolerating it well. Settings adjusted per home settings. Patient states he wear 4-5cmh2o at home. No complications noted.

## 2019-08-06 ENCOUNTER — Other Ambulatory Visit (HOSPITAL_COMMUNITY): Payer: BLUE CROSS/BLUE SHIELD

## 2019-08-06 ENCOUNTER — Encounter (HOSPITAL_COMMUNITY)
Admission: EM | Disposition: A | Discharge status: Home / Self Care | Payer: Self-pay | Source: Other Acute Inpatient Hospital | Attending: Interventional Cardiology

## 2019-08-06 ENCOUNTER — Inpatient Hospital Stay (HOSPITAL_COMMUNITY): Payer: BLUE CROSS/BLUE SHIELD

## 2019-08-06 ENCOUNTER — Encounter (HOSPITAL_COMMUNITY): Payer: Self-pay | Admitting: Interventional Cardiology

## 2019-08-06 DIAGNOSIS — I25110 Atherosclerotic heart disease of native coronary artery with unstable angina pectoris: Secondary | ICD-10-CM

## 2019-08-06 DIAGNOSIS — I214 Non-ST elevation (NSTEMI) myocardial infarction: Secondary | ICD-10-CM

## 2019-08-06 HISTORY — PX: LEFT HEART CATH: CATH118248

## 2019-08-06 HISTORY — PX: CORONARY STENT INTERVENTION: CATH118234

## 2019-08-06 LAB — COMPREHENSIVE METABOLIC PANEL
ALT: 16 U/L (ref 0–44)
AST: 27 U/L (ref 15–41)
Albumin: 1.9 g/dL — ABNORMAL LOW (ref 3.5–5.0)
Alkaline Phosphatase: 31 U/L — ABNORMAL LOW (ref 38–126)
Anion gap: 7 (ref 5–15)
BUN: 8 mg/dL (ref 6–20)
CO2: 17 mmol/L — ABNORMAL LOW (ref 22–32)
Calcium: 5.2 mg/dL — CL (ref 8.9–10.3)
Chloride: 117 mmol/L — ABNORMAL HIGH (ref 98–111)
Creatinine, Ser: 0.43 mg/dL — ABNORMAL LOW (ref 0.61–1.24)
GFR calc Af Amer: >60 mL/min (ref >60)
GFR calc non Af Amer: >60 mL/min (ref >60)
Glucose, Bld: 74 mg/dL (ref 70–99)
Potassium: 2.2 mmol/L — CL (ref 3.5–5.1)
Sodium: 141 mmol/L (ref 135–145)
Total Bilirubin: 0.7 mg/dL (ref 0.3–1.2)
Total Protein: 3.5 g/dL — ABNORMAL LOW (ref 6.5–8.1)

## 2019-08-06 LAB — CBC
HCT: 48.7 % (ref 39.0–52.0)
HCT: 38.2 % — ABNORMAL LOW (ref 39.0–52.0)
Hemoglobin: 16.2 g/dL (ref 13.0–17.0)
Hemoglobin: 12.6 g/dL — ABNORMAL LOW (ref 13.0–17.0)
MCH: 32.8 pg (ref 26.0–34.0)
MCH: 33.3 pg (ref 26.0–34.0)
MCHC: 33.3 g/dL (ref 30.0–36.0)
MCHC: 33 g/dL (ref 30.0–36.0)
MCV: 98.6 fL (ref 80.0–100.0)
MCV: 101.1 fL — ABNORMAL HIGH (ref 80.0–100.0)
Platelets: 222 10*3/uL (ref 150–400)
Platelets: 162 10*3/uL (ref 150–400)
RBC: 4.94 MIL/uL (ref 4.22–5.81)
RBC: 3.78 MIL/uL — ABNORMAL LOW (ref 4.22–5.81)
RDW: 12.9 % (ref 11.5–15.5)
RDW: 13.1 % (ref 11.5–15.5)
WBC: 11.3 10*3/uL — ABNORMAL HIGH (ref 4.0–10.5)
WBC: 8.6 10*3/uL (ref 4.0–10.5)
nRBC: 0 % (ref 0.0–0.2)
nRBC: 0 % (ref 0.0–0.2)

## 2019-08-06 LAB — ECHOCARDIOGRAM COMPLETE: Weight: 5848.36 oz

## 2019-08-06 LAB — BASIC METABOLIC PANEL
Anion gap: 10 (ref 5–15)
BUN: 12 mg/dL (ref 6–20)
CO2: 26 mmol/L (ref 22–32)
Calcium: 8.8 mg/dL — ABNORMAL LOW (ref 8.9–10.3)
Chloride: 101 mmol/L (ref 98–111)
Creatinine, Ser: 0.82 mg/dL (ref 0.61–1.24)
GFR calc Af Amer: >60 mL/min (ref >60)
GFR calc non Af Amer: >60 mL/min (ref >60)
Glucose, Bld: 105 mg/dL — ABNORMAL HIGH (ref 70–99)
Potassium: 3.5 mmol/L (ref 3.5–5.1)
Sodium: 137 mmol/L (ref 135–145)

## 2019-08-06 LAB — POTASSIUM
Potassium: 2.5 mmol/L — CL (ref 3.5–5.1)
Potassium: 3.8 mmol/L (ref 3.5–5.1)

## 2019-08-06 LAB — POCT I-STAT, CHEM 8
BUN: 11 mg/dL (ref 6–20)
Calcium, Ion: 1.21 mmol/L (ref 1.15–1.40)
Chloride: 101 mmol/L (ref 98–111)
Creatinine, Ser: 0.7 mg/dL (ref 0.61–1.24)
Glucose, Bld: 95 mg/dL (ref 70–99)
HCT: 49 % (ref 39.0–52.0)
Hemoglobin: 16.7 g/dL (ref 13.0–17.0)
Potassium: 3.7 mmol/L (ref 3.5–5.1)
Sodium: 139 mmol/L (ref 135–145)
TCO2: 25 mmol/L (ref 22–32)

## 2019-08-06 LAB — MAGNESIUM
Magnesium: 1.3 mg/dL — ABNORMAL LOW (ref 1.7–2.4)
Magnesium: 2.1 mg/dL (ref 1.7–2.4)

## 2019-08-06 LAB — POCT ACTIVATED CLOTTING TIME
Activated Clotting Time: 555 seconds
Activated Clotting Time: 202 seconds
Activated Clotting Time: 340 seconds
Activated Clotting Time: 362 seconds

## 2019-08-06 LAB — HEPARIN LEVEL (UNFRACTIONATED): Heparin Unfractionated: >2.2 IU/mL — ABNORMAL HIGH (ref 0.30–0.70)

## 2019-08-06 LAB — HEMOGLOBIN A1C
Hgb A1c MFr Bld: 5.6 % (ref 4.8–5.6)
Mean Plasma Glucose: 114.02 mg/dL

## 2019-08-06 SURGERY — CORONARY STENT INTERVENTION
Anesthesia: LOCAL

## 2019-08-06 MED ORDER — NITROGLYCERIN 1 MG/10 ML FOR IR/CATH LAB
INTRA_ARTERIAL | Status: AC
  Filled 2019-08-06: qty 10

## 2019-08-06 MED ORDER — SODIUM CHLORIDE 0.9% FLUSH: 3.0000 mL | INTRAVENOUS | Status: DC | PRN

## 2019-08-06 MED ORDER — ATORVASTATIN CALCIUM 80 MG PO TABS
80.0000 mg | ORAL_TABLET | Freq: Every day | ORAL | Status: DC
  Administered 2019-08-06: 80 mg via ORAL
  Filled 2019-08-06: qty 1

## 2019-08-06 MED ORDER — FENTANYL CITRATE (PF) 100 MCG/2ML IJ SOLN
INTRAMUSCULAR | Status: DC | PRN
  Administered 2019-08-06: 50 ug via INTRAVENOUS
  Administered 2019-08-06 (×2): 25 ug via INTRAVENOUS

## 2019-08-06 MED ORDER — ACETAMINOPHEN 325 MG PO TABS: 650.0000 mg | ORAL_TABLET | ORAL | Status: DC | PRN

## 2019-08-06 MED ORDER — FENTANYL CITRATE (PF) 100 MCG/2ML IJ SOLN
INTRAMUSCULAR | Status: AC
  Filled 2019-08-06: qty 2

## 2019-08-06 MED ORDER — ALPRAZOLAM 0.25 MG PO TABS: 0.2500 mg | ORAL_TABLET | Freq: Two times a day (BID) | ORAL | Status: DC | PRN

## 2019-08-06 MED ORDER — HEPARIN SODIUM (PORCINE) 1000 UNIT/ML IJ SOLN
INTRAMUSCULAR | Status: DC | PRN
  Administered 2019-08-06: 4000 [IU] via INTRAVENOUS
  Administered 2019-08-06: 10000 [IU] via INTRAVENOUS

## 2019-08-06 MED ORDER — FENTANYL CITRATE (PF) 100 MCG/2ML IJ SOLN
INTRAMUSCULAR | Status: DC | PRN
  Administered 2019-08-06: 25 ug via INTRAVENOUS

## 2019-08-06 MED ORDER — LIDOCAINE HCL (PF) 1 % IJ SOLN
INTRAMUSCULAR | Status: DC | PRN
  Administered 2019-08-06: 2 mL

## 2019-08-06 MED ORDER — SODIUM CHLORIDE 0.9% FLUSH
3.0000 mL | Freq: Two times a day (BID) | INTRAVENOUS | Status: DC
  Administered 2019-08-07: 3 mL via INTRAVENOUS

## 2019-08-06 MED ORDER — DIAZEPAM 5 MG PO TABS: 5.0000 mg | ORAL_TABLET | Freq: Four times a day (QID) | ORAL | Status: DC | PRN

## 2019-08-06 MED ORDER — CHLORHEXIDINE GLUCONATE CLOTH 2 % EX PADS: 6.0000 | MEDICATED_PAD | Freq: Every day | CUTANEOUS | Status: DC

## 2019-08-06 MED ORDER — VERAPAMIL HCL 2.5 MG/ML IV SOLN
INTRAVENOUS | Status: DC | PRN
  Administered 2019-08-06: 10 mL via INTRA_ARTERIAL

## 2019-08-06 MED ORDER — ONDANSETRON HCL 4 MG/2ML IJ SOLN: 4.0000 mg | Freq: Four times a day (QID) | INTRAMUSCULAR | Status: DC | PRN

## 2019-08-06 MED ORDER — ZOLPIDEM TARTRATE 5 MG PO TABS: 5.0000 mg | ORAL_TABLET | Freq: Every evening | ORAL | Status: DC | PRN

## 2019-08-06 MED ORDER — TICAGRELOR 90 MG PO TABS: 90.0000 mg | ORAL_TABLET | Freq: Two times a day (BID) | ORAL | Status: DC

## 2019-08-06 MED ORDER — HEPARIN SODIUM (PORCINE) 1000 UNIT/ML IJ SOLN
INTRAMUSCULAR | Status: AC
  Filled 2019-08-06: qty 1

## 2019-08-06 MED ORDER — TIROFIBAN HCL IV 12.5 MG/250 ML
22.9200 ug/min | INTRAVENOUS | Status: DC
  Administered 2019-08-06 (×2): 22.92 ug/min via INTRAVENOUS
  Filled 2019-08-06 (×2): qty 250

## 2019-08-06 MED ORDER — HEPARIN (PORCINE) IN NACL 1000-0.9 UT/500ML-% IV SOLN
INTRAVENOUS | Status: AC
  Filled 2019-08-06: qty 1000

## 2019-08-06 MED ORDER — HEPARIN (PORCINE) IN NACL 1000-0.9 UT/500ML-% IV SOLN
INTRAVENOUS | Status: DC | PRN
  Administered 2019-08-06 (×2): 500 mL

## 2019-08-06 MED ORDER — SODIUM CHLORIDE 0.9 % IV SOLN
INTRAVENOUS | Status: DC
  Administered 2019-08-06 – 2019-08-07 (×2): via INTRAVENOUS

## 2019-08-06 MED ORDER — PERFLUTREN LIPID MICROSPHERE
1.0000 mL | INTRAVENOUS | Status: AC | PRN
  Administered 2019-08-06: 14:00:00 2.5 mL via INTRAVENOUS
  Filled 2019-08-06: qty 10

## 2019-08-06 MED ORDER — HYDRALAZINE HCL 20 MG/ML IJ SOLN: 10.0000 mg | INTRAMUSCULAR | Status: AC | PRN

## 2019-08-06 MED ORDER — NITROGLYCERIN 1 MG/10 ML FOR IR/CATH LAB
INTRA_ARTERIAL | Status: DC | PRN
  Administered 2019-08-06 (×2): 200 ug via INTRACORONARY

## 2019-08-06 MED ORDER — SODIUM CHLORIDE 0.9 % IV SOLN: 250.0000 mL | INTRAVENOUS | Status: DC | PRN

## 2019-08-06 MED ORDER — VERAPAMIL HCL 2.5 MG/ML IV SOLN
INTRAVENOUS | Status: AC
  Filled 2019-08-06: qty 2

## 2019-08-06 MED ORDER — ASPIRIN 81 MG PO CHEW: 81.0000 mg | CHEWABLE_TABLET | Freq: Every day | ORAL | Status: DC

## 2019-08-06 MED ORDER — LIDOCAINE HCL (PF) 1 % IJ SOLN
INTRAMUSCULAR | Status: AC
  Filled 2019-08-06: qty 30

## 2019-08-06 MED ORDER — MIDAZOLAM HCL 2 MG/2ML IJ SOLN
INTRAMUSCULAR | Status: AC
  Filled 2019-08-06: qty 2

## 2019-08-06 MED ORDER — NITROGLYCERIN 0.4 MG SL SUBL: 0.4000 mg | SUBLINGUAL_TABLET | SUBLINGUAL | Status: DC | PRN

## 2019-08-06 MED ORDER — MIDAZOLAM HCL 2 MG/2ML IJ SOLN
INTRAMUSCULAR | Status: DC | PRN
  Administered 2019-08-06: 2 mg via INTRAVENOUS
  Administered 2019-08-06: 1 mg via INTRAVENOUS

## 2019-08-06 MED ORDER — SODIUM CHLORIDE 0.9% FLUSH
3.0000 mL | Freq: Two times a day (BID) | INTRAVENOUS | Status: DC
  Administered 2019-08-06: 3 mL via INTRAVENOUS

## 2019-08-06 MED ORDER — LABETALOL HCL 5 MG/ML IV SOLN: 10.0000 mg | INTRAVENOUS | Status: AC | PRN

## 2019-08-06 MED ORDER — IOHEXOL 350 MG/ML SOLN
INTRAVENOUS | Status: DC | PRN
  Administered 2019-08-06: 11:00:00 175 mL

## 2019-08-06 MED FILL — Heparin Sod (Porcine) in NaCl IV Soln 25000 Unit/250ML-0.45%: INTRAVENOUS | Qty: 250 | Status: AC

## 2019-08-06 SURGICAL SUPPLY — 16 items
BALLN SAPPHIRE 3.0X15 (BALLOONS) ×2
BALLN SAPPHIRE ~~LOC~~ 4.0X18 (BALLOONS) ×1 IMPLANT
BALLOON SAPPHIRE 3.0X15 (BALLOONS) IMPLANT
CATH VISTA GUIDE 6FR JR4 (CATHETERS) ×1 IMPLANT
CATH VISTA GUIDE 6FR XBRCA (CATHETERS) ×1 IMPLANT
DEVICE RAD COMP TR BAND LRG (VASCULAR PRODUCTS) ×1 IMPLANT
GLIDESHEATH SLEND SS 6F .021 (SHEATH) ×1 IMPLANT
GUIDEWIRE INQWIRE 1.5J.035X260 (WIRE) IMPLANT
INQWIRE 1.5J .035X260CM (WIRE) ×2
KIT ENCORE 26 ADVANTAGE (KITS) ×1 IMPLANT
KIT HEART LEFT (KITS) ×2 IMPLANT
PACK CARDIAC CATHETERIZATION (CUSTOM PROCEDURE TRAY) ×2 IMPLANT
STENT RESOLUTE ONYX 3.5X34 (Permanent Stent) ×1 IMPLANT
TRANSDUCER W/STOPCOCK (MISCELLANEOUS) ×2 IMPLANT
TUBING CIL FLEX 10 FLL-RA (TUBING) ×2 IMPLANT
WIRE COUGAR XT STRL 190CM (WIRE) ×1 IMPLANT

## 2019-08-06 NOTE — Plan of Care (Signed)
Received stent today RCA, continues on aggrastat.  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 Will be on statins, inhibitors on d/c while inpatient, pet passed away.  ND Knowledge deficit related to disease process, management of  medications, diet and cardiac rehabilitation.   Education related to statins, prediabetic teaching performed  Rest and decrease of stressors.  Commutes a hour away to job Follow up with cardiologist and primary care MD.  Cut carbs, wieght loss plan,  discuss lisnopril as patient BP staying in low range post hospitalization.

## 2019-08-06 NOTE — Progress Notes (Signed)
Echocardiogram 2D Echocardiogram has been performed.  Oneal Deputy Teagen Bucio 08/06/2019, 2:21 PM

## 2019-08-06 NOTE — Progress Notes (Signed)
Repeat Labs WNL. IVf stopped for 2 min drawn way above IV sites.  Will hold on repeat CBC and attain this in AM to avoid sticks. Paitent jst lost his pet of 11 years. Chaplain in room with patient.

## 2019-08-06 NOTE — Progress Notes (Signed)
Returned from cath lab. Alert nad oreitned no nausea, no chest pain. Drinking fluids nad tolerating. Encouraged and instructed not to use right arm. Previous hematoma reduced, still small knot and arm swollen will monitor closely. TR band on right wrist.  No bleeding under,  Perfusion good, good pleth on sao2 monitor.

## 2019-08-06 NOTE — Progress Notes (Signed)
To cath lab  Via bed with tele. heparin placed on hold prior to transport. Hand off given to cath lab RN.

## 2019-08-06 NOTE — Progress Notes (Signed)
Progress Note  Patient Name: Antwand Arostegui Date of Encounter: 08/06/2019  Primary Cardiologist: No primary care provider on file. F/u in [place]  Subjective   No CP overnight  Inpatient Medications    Scheduled Meds: . aspirin  81 mg Oral Daily  . aspirin  81 mg Oral Daily  . atorvastatin  80 mg Oral q1800  . atorvastatin  80 mg Oral q1800  . Chlorhexidine Gluconate Cloth  6 each Topical Daily  . sodium chloride flush  3 mL Intravenous Q12H  . sodium chloride flush  3 mL Intravenous Q12H  . sodium chloride flush  3 mL Intravenous Q12H  . sodium chloride flush  3 mL Intravenous Q12H  . ticagrelor  90 mg Oral BID  . ticagrelor  90 mg Oral BID   Continuous Infusions: . sodium chloride    . sodium chloride    . sodium chloride    . sodium chloride    . heparin Stopped (08/06/19 0941)  . tirofiban 22.92 mcg/min (08/06/19 0859)   PRN Meds: sodium chloride, sodium chloride, sodium chloride, acetaminophen, acetaminophen, ALPRAZolam, diazepam, hydrALAZINE, labetalol, nitroGLYCERIN, ondansetron (ZOFRAN) IV, ondansetron (ZOFRAN) IV, oxyCODONE, sodium chloride flush, sodium chloride flush, sodium chloride flush, zolpidem   Vital Signs    Vitals:   08/06/19 1109 08/06/19 1109 08/06/19 1114 08/06/19 1140  BP: (!) 146/83 (!) 146/83 140/77   Pulse: 64 66 72   Resp: 15 14 15    Temp:    97.9 F (36.6 C)  TempSrc:    Oral  SpO2: 98% 98% 94%   Weight:        Intake/Output Summary (Last 24 hours) at 08/06/2019 1146 Last data filed at 08/06/2019 0859 Gross per 24 hour  Intake 346.1 ml  Output 1600 ml  Net -1253.9 ml   Last 3 Weights 08/05/2019 08/05/2019 07/15/2019  Weight (lbs) 365 lb 8.4 oz 340 lb 362 lb 3.2 oz  Weight (kg) 165.8 kg 154.223 kg 164.293 kg      Telemetry    NSR - Personally Reviewed  ECG      Physical Exam   GEN: No acute distress.   Neck: No JVD Cardiac: RRR, no murmurs, rubs, or gallops.  Respiratory: Clear to auscultation bilaterally. GI:  Soft, nontender, non-distended  MS: No edema; No deformity.  No right wrist hematoma Neuro:  Nonfocal  Psych: Normal affect   Labs    High Sensitivity Troponin:   Recent Labs  Lab 08/05/19 1545 08/05/19 2003  TROPONINIHS 1,977* 4,674*      Chemistry Recent Labs  Lab 08/05/19 1545 08/06/19 0229 08/06/19 0841  NA 137 137 141  K 4.1 3.5 2.2*  CL 101 101 117*  CO2 23 26 17*  GLUCOSE 95 105* 74  BUN 15 12 8   CREATININE 0.77 0.82 0.43*  CALCIUM 9.1 8.8* 5.2*  PROT  --   --  3.5*  ALBUMIN  --   --  1.9*  AST  --   --  27  ALT  --   --  16  ALKPHOS  --   --  31*  BILITOT  --   --  0.7  GFRNONAA >60 >60 >60  GFRAA >60 >60 >60  ANIONGAP 13 10 7      Hematology Recent Labs  Lab 08/05/19 1545 08/06/19 0229  WBC 13.6* 11.3*  RBC 5.24 4.94  HGB 17.6* 16.2  HCT 52.3* 48.7  MCV 99.8 98.6  MCH 33.6 32.8  MCHC 33.7 33.3  RDW 12.9 12.9  PLT 218 222    BNPNo results for input(s): BNP, PROBNP in the last 168 hours.   DDimer No results for input(s): DDIMER in the last 168 hours.   Radiology    No results found.  Cardiac Studies   I personally reviewed the cath images  Patient Profile     53 y.o. male with inferior MI  Assessment & Plan    1) CAD: s/p RCA stent. DAPT along with aggressive secondary prevention and RF modification.  Still with some residual thrombus in the ostium of a posterolateral branch.  Too small for PCI.  Will need high dose statin and beta blocker.   Likely discharge in AM.  Could consider discharge later today if he was doing very well and ok with Dr. Claiborne Billings based on cath procedure.   For questions or updates, please contact [place] Please consult [email or web address] for contact info under        Signed, Larae Grooms, MD  08/06/2019, 11:46 AM

## 2019-08-06 NOTE — Progress Notes (Signed)
Jeremy Guerrero was doing well from his heart attack and stent but he was very upset because his dog was very ill and he wanted to be there for the dog as she would need help.  HE got call after prayer saying his dog had coded and he was very upset.  He and friend and dog were able to do facetime with phone and hopefully this brought some closure. Conard Novak, Chaplain   08/06/19 1900  Clinical Encounter Type  Visited With Patient  Visit Type Initial;Spiritual support;Post-op  Referral From Nurse  Consult/Referral To Chaplain  Spiritual Encounters  Spiritual Needs Prayer;Emotional  Stress Factors  Patient Stress Factors Loss

## 2019-08-06 NOTE — Progress Notes (Signed)
CRITICAL VALUE ALERT  Critical Value: Potassium 2.5  Date & Time Notied:  08/06/2019 1700  Provider Notified:  Hinton Dyer PA  Orders Received/Actions taken:  Will discuss with Dr. Gwenlyn Found

## 2019-08-06 NOTE — Progress Notes (Addendum)
   Stat K of 2.5 and Mg 1.3 called. Labs reviewed, somewhat peculiar and inconsistent. K was normal at OSH at 4.1. Values this AM showed a K of 2.2 but other surrounding values were 3.5 and 3.7. He is not being diuresed and is not currently on any medications that would be a suspected culprit to drop his potassium so dramatically. He was previously on HCTZ but not on this at home. Nurse reports lab draw was obtained above the fluid site going at 150 ml/hr with NS and aggrastat. No muscle cramping or arrhythmias noted. Hgb is also quite different than prior by about 4g. EKG shows NSR 88bpm, nonspecific STT changes in III and avF - he had similar appearance of lead III in prior tracings. There were technical difficulties getting lead V2. However, QTc is normal and no diffuse U waves seen. Patient is resting comfortably without complaint. I reviewed with Dr. Gwenlyn Found who is skeptical of validity of lab results. Have re-ordered stat K, Mg, Hgb with instructions for lab to draw from alternative site.   Addendum: K, Mg returned totally normal. As such, suspect CBC was erroneous as well. Patient is currently with chaplain due to losing family pet while he is admitted and wishing to avoid further sticks, so will f/u Hgb in AM.  Melina Copa PA-C

## 2019-08-07 ENCOUNTER — Telehealth: Payer: Self-pay | Admitting: Interventional Cardiology

## 2019-08-07 ENCOUNTER — Encounter (HOSPITAL_COMMUNITY): Payer: Self-pay | Admitting: *Deleted

## 2019-08-07 DIAGNOSIS — I10 Essential (primary) hypertension: Secondary | ICD-10-CM

## 2019-08-07 DIAGNOSIS — Z95820 Peripheral vascular angioplasty status with implants and grafts: Secondary | ICD-10-CM

## 2019-08-07 HISTORY — DX: Peripheral vascular angioplasty status with implants and grafts: Z95.820

## 2019-08-07 LAB — CBC
HCT: 46.6 % (ref 39.0–52.0)
Hemoglobin: 15.7 g/dL (ref 13.0–17.0)
MCH: 33.3 pg (ref 26.0–34.0)
MCHC: 33.7 g/dL (ref 30.0–36.0)
MCV: 98.9 fL (ref 80.0–100.0)
Platelets: 191 10*3/uL (ref 150–400)
RBC: 4.71 MIL/uL (ref 4.22–5.81)
RDW: 13 % (ref 11.5–15.5)
WBC: 9.3 10*3/uL (ref 4.0–10.5)
nRBC: 0 % (ref 0.0–0.2)

## 2019-08-07 LAB — BASIC METABOLIC PANEL
Anion gap: 6 (ref 5–15)
BUN: 9 mg/dL (ref 6–20)
CO2: 25 mmol/L (ref 22–32)
Calcium: 8.4 mg/dL — ABNORMAL LOW (ref 8.9–10.3)
Chloride: 106 mmol/L (ref 98–111)
Creatinine, Ser: 0.73 mg/dL (ref 0.61–1.24)
GFR calc Af Amer: >60 mL/min (ref >60)
GFR calc non Af Amer: >60 mL/min (ref >60)
Glucose, Bld: 110 mg/dL — ABNORMAL HIGH (ref 70–99)
Potassium: 3.8 mmol/L (ref 3.5–5.1)
Sodium: 137 mmol/L (ref 135–145)

## 2019-08-07 LAB — MRSA PCR SCREENING: MRSA by PCR: NEGATIVE

## 2019-08-07 LAB — MAGNESIUM: Magnesium: 2 mg/dL (ref 1.7–2.4)

## 2019-08-07 MED ORDER — ATORVASTATIN CALCIUM 80 MG PO TABS: 80.0000 mg | ORAL_TABLET | Freq: Every day | ORAL | 6 refills | Status: DC

## 2019-08-07 MED ORDER — NITROGLYCERIN 0.4 MG SL SUBL: 0.4000 mg | SUBLINGUAL_TABLET | SUBLINGUAL | 4 refills | Status: AC | PRN

## 2019-08-07 MED ORDER — TICAGRELOR 90 MG PO TABS: 90.0000 mg | ORAL_TABLET | Freq: Two times a day (BID) | ORAL | 11 refills | Status: DC

## 2019-08-07 MED ORDER — ASPIRIN 81 MG PO CHEW: 81.0000 mg | CHEWABLE_TABLET | Freq: Every day | ORAL | Status: DC

## 2019-08-07 MED ORDER — ACETAMINOPHEN 325 MG PO TABS: 650.0000 mg | ORAL_TABLET | ORAL | Status: AC | PRN

## 2019-08-07 MED FILL — BRILINTA 90 MG TABLET: 90 | 30 days supply | Qty: 60 | Fill #0

## 2019-08-07 MED FILL — NITROGLYCERIN 0.4 MG TAB SL: 0.4 | 7 days supply | Qty: 25 | Fill #0

## 2019-08-07 MED FILL — ATORVASTATIN CALCIUM 80 MG: 80 | 30 days supply | Qty: 30 | Fill #0

## 2019-08-07 NOTE — Progress Notes (Signed)
Progress Note  Patient Name: Jeremy Guerrero Date of Encounter: 08/07/2019  Primary Cardiologist: Larae Grooms, MD   Subjective   Had some chest pressure post procedure but this has improved.  Dog died yesterday so wants to go home.   Inpatient Medications    Scheduled Meds: . aspirin  81 mg Oral Daily  . atorvastatin  80 mg Oral q1800  . Chlorhexidine Gluconate Cloth  6 each Topical Daily  . sodium chloride flush  3 mL Intravenous Q12H  . sodium chloride flush  3 mL Intravenous Q12H  . sodium chloride flush  3 mL Intravenous Q12H  . sodium chloride flush  3 mL Intravenous Q12H  . ticagrelor  90 mg Oral BID   Continuous Infusions: . sodium chloride    . sodium chloride    . sodium chloride 100 mL/hr at 08/07/19 0700  . sodium chloride     PRN Meds: sodium chloride, sodium chloride, sodium chloride, acetaminophen, ALPRAZolam, diazepam, nitroGLYCERIN, ondansetron (ZOFRAN) IV, oxyCODONE, sodium chloride flush, sodium chloride flush, sodium chloride flush, zolpidem   Vital Signs    Vitals:   08/07/19 0800 08/07/19 0839 08/07/19 0900 08/07/19 1034  BP: 111/63  117/67 124/70  Pulse: 89     Resp: 16     Temp:  98.5 F (36.9 C)    TempSrc:  Oral    SpO2: 94%     Weight:        Intake/Output Summary (Last 24 hours) at 08/07/2019 1136 Last data filed at 08/07/2019 0901 Gross per 24 hour  Intake 4824.78 ml  Output 2900 ml  Net 1924.78 ml   Last 3 Weights 08/07/2019 08/05/2019 08/05/2019  Weight (lbs) 364 lb 6.7 oz 365 lb 8.4 oz 340 lb  Weight (kg) 165.3 kg 165.8 kg 154.223 kg      Telemetry    NSR, short runs of NSVT, 3-4 beats - Personally Reviewed  ECG    NSR, inferior TWI - Personally Reviewed  Physical Exam   GEN: No acute distress.   Neck: No JVD Cardiac: RRR, no murmurs, rubs, or gallops.  Respiratory: Clear to auscultation bilaterally. GI: Soft, nontender, non-distended  MS: No edema; No deformity. Neuro:  Nonfocal  Psych: Normal affect    Labs    High Sensitivity Troponin:   Recent Labs  Lab 08/05/19 1545 08/05/19 2003  TROPONINIHS 1,977* 4,674*      Chemistry Recent Labs  Lab 08/06/19 0229 08/06/19 0841 08/06/19 1015 08/06/19 1549 08/06/19 1809 08/07/19 0341  NA 137 141 139  --   --  137  K 3.5 2.2* 3.7 2.5* 3.8 3.8  CL 101 117* 101  --   --  106  CO2 26 17*  --   --   --  25  GLUCOSE 105* 74 95  --   --  110*  BUN 12 8 11   --   --  9  CREATININE 0.82 0.43* 0.70  --   --  0.73  CALCIUM 8.8* 5.2*  --   --   --  8.4*  PROT  --  3.5*  --   --   --   --   ALBUMIN  --  1.9*  --   --   --   --   AST  --  27  --   --   --   --   ALT  --  16  --   --   --   --   ALKPHOS  --  31*  --   --   --   --   BILITOT  --  0.7  --   --   --   --   GFRNONAA >60 >60  --   --   --  >60  GFRAA >60 >60  --   --   --  >60  ANIONGAP 10 7  --   --   --  6     Hematology Recent Labs  Lab 08/06/19 0229 08/06/19 1015 08/06/19 1549 08/07/19 0341  WBC 11.3*  --  8.6 9.3  RBC 4.94  --  3.78* 4.71  HGB 16.2 16.7 12.6* 15.7  HCT 48.7 49.0 38.2* 46.6  MCV 98.6  --  101.1* 98.9  MCH 32.8  --  33.3 33.3  MCHC 33.3  --  33.0 33.7  RDW 12.9  --  13.1 13.0  PLT 222  --  162 191    BNPNo results for input(s): BNP, PROBNP in the last 168 hours.   DDimer No results for input(s): DDIMER in the last 168 hours.   Radiology    No results found.  Cardiac Studies   Cath films personally reviewed  Patient Profile     53 y.o. male with inferior MI  Assessment & Plan    1) COntinue DAPT along with aggressive secondary prevention.  Plan discharge later today.  HE will f/u in [place].   2) Normal LV function.  SOme bradycardia and low BPs noted.  WIll hold beta blocker and ACE-I at this time. Continue statin and DAPT>.   3) Weight loss will be beneficial.   For questions or updates, please contact [place] Please consult [email or web address] for contact info under        Signed, Larae Grooms, MD  08/07/2019, 11:36  AM

## 2019-08-07 NOTE — Telephone Encounter (Signed)
**Note De-Identified Ardelia Wrede Obfuscation** The pt is currently in the hospital. We will call him once he has been discharged.

## 2019-08-07 NOTE — Care Management (Signed)
Per Lex P. W/ [facility].Phone Number [phone]. Co-pay for Brilinta 90 mg twice a day $35.00 for 30 day supply Co-pay for Brilinta 90mg . twice a day mail order pharmacy [facility]) $88.00 for a 90 day supply.  No PA required No deductible Tier 2 medication Retail Pharmacies : [facility], [facility] pharmacy.

## 2019-08-07 NOTE — Care Management (Signed)
Brilinta check sent and pending.  Naveed Humphres RN, BSN, NCM-BC, ACM-RN [phone] 

## 2019-08-07 NOTE — Discharge Summary (Addendum)
Discharge Summary    Patient ID: Jeremy Guerrero MRN: [id number]; DOB: 12-21-1965  Admit date: 08/05/2019 Discharge date: 08/07/2019  Primary Care Provider: Laurey Morale, MD  Primary Cardiologist: Larae Grooms, MD  Primary Electrophysiologist:  None   Discharge Diagnoses    Principal Problem:   NSTEMI (non-ST elevated myocardial infarction) [facility] Active Problems:   S/P angioplasty with stent 08/06/19 with DES to large dominant distal RCA    Hyperlipidemia with target LDL less than 70   OSA (obstructive sleep apnea)   HTN (hypertension)   CAD (coronary artery disease), native coronary artery    Diagnostic Studies/Procedures    Cardiac cath 08/05/19   Non-ST elevation acute coronary syndrome due to plaque rupture in the mid to distal RCA and subsequent distal embolization with residual 60 to 65% culprit lesion, 75% for the distal lesion with superimposed thrombus, and embolus noted in the ostium of the continuation of the right coronary beyond the PDA.  The PDA wraps around the left ventricular apex.  There is right coronary dominance.  Left main is widely patent  LAD is widely patent but the LAD distribution does not wraparound the left ventricular apex.  There is a very large first diagonal.  There is nonobstructive less than 50% atherosclerosis in the mid LAD.  Circumflex contains a first obtuse marginal that is eccentric 40% narrowed.  Normal LV anterior wall motion.  Estimated EF greater than 50%.  LVEDP is normal.  RECOMMENDATIONS:   Because of suspected heavy plaque/thrombus burden in the distal culprit RCA stenosis, will give 12 to 24 hours of anticoagulation combined with 2b3a (Aggrastat).  Loaded with Brilinta.  PCI on distal right coronary using a single stent tomorrow afternoon.  The hope is that preparation with antiplatelet, antithrombotic therapy will lessen thrombus burden and decrease the chance of no reflow/further distal  embolization.  Diagnostic Dominance: Right   _____________  PCI 08/06/19   Dist RCA lesion is 75% stenosed.  RPAV lesion is 60% stenosed.  Mid RCA to Dist RCA lesion is 80% stenosed.  A stent was successfully placed.  Post intervention, there is a 0% residual stenosis.  Post intervention, there is a 0% residual stenosis.   Successful percutaneous coronary intervention to a large dominant distal RCA with residual 80 and 75% stenoses with residual thrombus and previously documented distal embolization to the continuation branch beyond the PDA takeoff.  The 80 and 75% stenoses were reduced to 0% with ultimate insertion of a 3.5 x 34 mm Resolute Onyx stent postdilated with stent taper from 4.06 to 3.9 mm.    RECOMMENDATION: DAPT therapy for minimum of 1 year. With residual thrombus in the continuation branch at completion of the procedure the patient will continue Aggrastat for an additional 18 to 24 hours.  Aggressive blood pressure control with target blood pressure less than 130/80, optimal LDL management with LDL cholesterol ideally in the 50s or below, weight reduction, continue treatment of obstructive sleep apnea, exercise and weight loss. Intervention      Echo 08/06/19  IMPRESSIONS    1. Left ventricular ejection fraction, by visual estimation, is 55%. The left ventricle has normal function. There is no left ventricular hypertrophy. Images are not adequate to comment on the motion of individual wall segments but overall EF appears  normal.  2. Left ventricular diastolic parameters are consistent with Grade II diastolic dysfunction (pseudonormalization).  3. Global right ventricle was not well visualized.The right ventricular size is not well visualized. Right vetricular wall thickness was  not assessed. The right ventricle was very poorly visualized. The interventricular septum was D-shaped, however,  suggesting a degree of RV pressure/volume overload.  4. Left  atrial size was normal.  5. Right atrial size was mildly dilated.  6. The aortic valve is tricuspid. Aortic valve regurgitation is not visualized. No evidence of aortic valve sclerosis or stenosis.  7. The mitral valve is normal in structure. No evidence of mitral valve regurgitation.  8. The tricuspid valve is not well visualized. Tricuspid valve regurgitation is not demonstrated.  9. TR signal is inadequate for assessing pulmonary artery systolic pressure. 10. The inferior vena cava is dilated in size with >50% respiratory variability, suggesting right atrial pressure of 8 mmHg. 11. Technically difficult study with poor acoustic windows.  FINDINGS  Left Ventricle: Left ventricular ejection fraction, by visual estimation, is 55%. The left ventricle has normal function. The left ventricular internal cavity size was the left ventricle is normal in size. There is no left ventricular hypertrophy. Left  ventricular diastolic parameters are consistent with Grade II diastolic dysfunction (pseudonormalization).  Right Ventricle: The right ventricular size is not well visualized. Right vetricular wall thickness was not assessed. Global RV systolic function is was not well visualized.  Left Atrium: Left atrial size was normal in size.  Right Atrium: Right atrial size was mildly dilated  Pericardium: There is no evidence of pericardial effusion.  Mitral Valve: The mitral valve is normal in structure. No evidence of mitral valve regurgitation.  Tricuspid Valve: The tricuspid valve is not well visualized. Tricuspid valve regurgitation is not demonstrated.  Aortic Valve: The aortic valve is tricuspid. Aortic valve regurgitation is not visualized. The aortic valve is structurally normal, with no evidence of sclerosis or stenosis.  Pulmonic Valve: The pulmonic valve was not well visualized. Pulmonic valve regurgitation is not visualized.  Aorta: The aortic root is normal in size and  structure.  Venous: The inferior vena cava is dilated in size with greater than 50% respiratory variability, suggesting right atrial pressure of 8 mmHg.  IAS/Shunts: No atrial level shunt detected by color flow Doppler.  History of Present Illness     Jeremy Guerrero is a 53 y.o. male with hx of HTN, HLD osa on CPAP morbid obesity, on Descovy w/ neg HIV testing presented to ER 08/05/19 with chest pain, an aching rated 7/10 and no SOB, with continued discomfort developed nausea then pain radiated to Jaw.   Went to [facility] and rec'd total of 4 baby asa, pain slowly eased, IV heparin started.    His initial ECG was abnormal, with mild ST elevation.  This improved on subsequent ECGs.  Labs in ER with Cr 0.77, K+ 4.1  Troponin 0.01 to 0.06 He was then transferred to [facility] for continued pain and need for cardiac cath.   Lisinopril hctz held and BB added.  Lipitor added.  Admitted to [facility].   Hospital Course     Consultants: none   08/05/19 cardiac cath with with ACS due to plaque rupture in the mid to distal RCA and subsequent distal embolization with residual 60 to 65% culprit lesion, 75% for the distal lesion with superimposed thrombus, and embolus noted in the ostium of the continuation of the right coronary beyond the PDA.  The PDA wraps around the left ventricular apex.  There is right coronary dominance.  With suspected heavy plaque and thrombus burden pt placed on Aggrastat and loaded with Brilinta.  Plans for PCI of distal RCA planned for the 18th.  pk hs troponin [id number]   Pt had PCI intervention, see above on the 18th and did well.   Plans for BP control < 130/80, LDL ideally in the 50s or below, wt reduction and continued treatment of OSA.   Today pt is doing well and has walked with cardiac rehab.  No angina.  He has been seen and evaluated by Dr. Irish Lack and found stable for discharge.  No ACE  Due to soft BP hld home lisinopril HCTZ and BB held due to some bradycardia.  Pt will  resume CPAP at home.  EF is normal.  Pt lives in [place] so would prefer follow up here.    Did the patient have an acute coronary syndrome (MI, NSTEMI, STEMI, etc) this admission?:  Yes                               AHA/ACC Clinical Performance & Quality Measures: 1. Aspirin prescribed? - Yes 2. ADP Receptor Inhibitor (Plavix/Clopidogrel, Brilinta/Ticagrelor or Effient/Prasugrel) prescribed (includes medically managed patients)? - Yes 3. Beta Blocker prescribed? - NO HR low at times.  4. High Intensity Statin (Lipitor 40-80mg  or Crestor 20-40mg ) prescribed? - Yes 5. EF assessed during THIS hospitalization? - Yes 6. For EF <40%, was ACEI/ARB prescribed? - --NO BP low normal EF 7. For EF <40%, Aldosterone Antagonist (Spironolactone or Eplerenone) prescribed? - Not Applicable (EF >/= [id number]) 8. Cardiac Rehab Phase II ordered (Included Medically managed Patients)? - Yes   _____________  Discharge Vitals Blood pressure 124/70, pulse 89, temperature 98.5 F (36.9 C), temperature source Oral, resp. rate 16, weight (!) 165.3 kg, SpO2 94 %.  Filed Weights   08/05/19 1527 08/05/19 1949 08/07/19 0345  Weight: (!) 154.2 kg (!) 165.8 kg (!) 165.3 kg    Labs & Radiologic Studies    CBC Recent Labs    08/06/19 1549 08/07/19 0341  WBC 8.6 9.3  HGB 12.6* 15.7  HCT 38.2* 46.6  MCV 101.1* 98.9  PLT 162 [id number]   Basic Metabolic Panel Recent Labs    08/06/19 0841 08/06/19 1015  08/06/19 1809 08/07/19 0341  NA 141 139  --   --  137  K 2.2* 3.7   < > 3.8 3.8  CL 117* 101  --   --  106  CO2 17*  --   --   --  25  GLUCOSE 74 95  --   --  110*  BUN 8 11  --   --  9  CREATININE 0.43* 0.70  --   --  0.73  CALCIUM 5.2*  --   --   --  8.4*  MG  --   --    < > 2.1 2.0   < > = values in this interval not displayed.   Liver Function Tests Recent Labs    08/06/19 0841  AST 27  ALT 16  ALKPHOS 31*  BILITOT 0.7  PROT 3.5*  ALBUMIN 1.9*   No results for input(s): LIPASE, AMYLASE in the  last 72 hours. High Sensitivity Troponin:   Recent Labs  Lab 08/05/19 1545 08/05/19 2003  TROPONINIHS 1,977* 4,674*    BNP Invalid input(s): POCBNP D-Dimer No results for input(s): DDIMER in the last 72 hours. Hemoglobin A1C Recent Labs    08/06/19 0841  HGBA1C 5.6   Fasting Lipid Panel No results for input(s): CHOL, HDL, LDLCALC, TRIG, CHOLHDL, LDLDIRECT in the last 72 hours. Thyroid Function Tests  No results for input(s): TSH, T4TOTAL, T3FREE, THYROIDAB in the last 72 hours.  Invalid input(s): FREET3 _____________  No results found. Disposition   Pt is being discharged home today in good condition.  Follow-up Plans & Appointments   Call [facility] at [phone] if any bleeding, swelling or drainage at cath site.  May shower, no tub baths for 48 hours for groin sticks. No lifting over 5 pounds for 5 days.  No Driving for 5 days  Take 1 NTG, under your tongue, while sitting.  If no relief of pain may repeat NTG, one tab every 5 minutes up to 3 tablets total over 15 minutes.  If no relief CALL 911.  If you have dizziness/lightheadness  while taking NTG, stop taking and call 911.         Heart healthy diet  Decrease carbs to help lose wt.   Cardiac rehab as instructed.   Call the office if any questions.   No work until after office visit.  We are holding your lisinopril HCTZ for now.  We may resume depending on BP on visit.   Follow-up Information    Jettie Booze, MD Follow up on 08/12/2019.   Specialties: Cardiology, Radiology, Interventional Cardiology Why: at 2:45 pm with his PA [employer] information: [id number] [address]. [address] [place] [address] [phone]          Discharge Instructions    Amb Referral to Cardiac Rehabilitation   Complete by: As directed    Diagnosis:  Coronary Stents NSTEMI     After initial evaluation and assessments completed: Virtual Based Care may be provided alone or in  conjunction with Phase 2 Cardiac Rehab based on patient barriers.: Yes      Discharge Medications   Allergies as of 08/07/2019   No Known Allergies     Medication List    STOP taking these medications   Descovy 200-25 MG tablet Generic drug: emtricitabine-tenofovir AF   ibuprofen 200 MG tablet Commonly known as: ADVIL   lisinopril-hydrochlorothiazide 20-25 MG tablet Commonly known as: ZESTORETIC   OVER THE COUNTER MEDICATION     TAKE these medications   acetaminophen 325 MG tablet Commonly known as: TYLENOL Take 2 tablets (650 mg total) by mouth every 4 (four) hours as needed for headache or mild pain.   aspirin 81 MG chewable tablet Chew 1 tablet (81 mg total) by mouth daily. Start taking on: August 08, 2019   atorvastatin 80 MG tablet Commonly known as: LIPITOR Take 1 tablet (80 mg total) by mouth daily at 6 PM.   nitroGLYCERIN 0.4 MG SL tablet Commonly known as: NITROSTAT Place 1 tablet (0.4 mg total) under the tongue every 5 (five) minutes x 3 doses as needed for chest pain.   ticagrelor 90 MG Tabs tablet Commonly known as: BRILINTA Take 1 tablet (90 mg total) by mouth 2 (two) times daily.          Outstanding Labs/Studies   Hepatic and lipid in 6 weeks.   Duration of Discharge Encounter   Greater than 30 minutes including physician time.  Signed, Cecilie Kicks, NP 08/07/2019, 11:46 AM  I have examined the patient and reviewed assessment and plan and discussed with patient.  Agree with above as stated.    1) COntinue DAPT along with aggressive secondary prevention.  Discharge later today.  HE will f/u in [place].  I stressed the importance of his Brilinta to prevent stent thrombosis.  2)  Normal LV function.  SOme bradycardia and low BPs noted.  WIll hold beta blocker and ACE-I at this time. Continue statin and DAPT.   3) Weight loss will be beneficial.   Larae Grooms

## 2019-08-07 NOTE — Telephone Encounter (Signed)
New message   Per Cecilie Kicks scheduled a TOC appt with Richardson Dopp on 08/12/2019 at 2:45 pm.

## 2019-08-07 NOTE — Progress Notes (Signed)
CARDIAC REHAB PHASE I   PRE:  Rate/Rhythm: 78 SR  BP:  Supine:   Sitting: 124/70  Standing:    SaO2: 97%RA  MODE:  Ambulation: 640 ft   POST:  Rate/Rhythm: 102 ST  BP:  Supine:   Sitting: 136/74  Standing:    SaO2: 98%RA 1023-1120 Pt walked 640 ft with steady gait and no CP. Tolerated well. MI education completed with pt and his partner. Understanding voiced. Stressed importance of brilinta with stent. Reviewed NTG use, MI restrictions, risk factors, walking for ex, heart healthy food choices, and CRP 2. Pt works in [place] but lives in [place]. Would like early CRP 2 class so he can go to work after exercising.  Pt is interested in participating in Virtual Cardiac and Pulmonary Rehab. Pt advised that Virtual Cardiac and Pulmonary Rehab is provided at no cost to the patient.  Checklist:  1. Pt has smart device  ie smartphone and/or ipad for downloading an app  Yes 2. Reliable internet/wifi service    Yes 3. Understands how to use their smartphone and navigate within an app.  Yes   Pt verbalized understanding and is in agreement.    Graylon Good, RN BSN  08/07/2019 11:13 AM

## 2019-08-07 NOTE — Discharge Instructions (Signed)
Call [facility] at [phone] if any bleeding, swelling or drainage at cath site.  May shower, no tub baths for 48 hours for groin sticks. No lifting over 5 pounds for 5 days.  No Driving for 5 days  Take 1 NTG, under your tongue, while sitting.  If no relief of pain may repeat NTG, one tab every 5 minutes up to 3 tablets total over 15 minutes.  If no relief CALL 911.  If you have dizziness/lightheadness  while taking NTG, stop taking and call 911.         Heart healthy diet  Decrease carbs to help lose wt.   Cardiac rehab as instructed.   Call the office if any questions.   No work until after office visit.  We are holding your lisinopril HCTZ for now.  We may resume depending on BP on visit.   Continue your CPAP   - - - - - - - - - - - - - - - - - - - - - - - - - - - - - - - - - - - - - - - - - - - - - - - - - - - - - - - - - - - - - - - - - - - - - - - - - - - - - - - - - - - - - - - - - - - - - - - -  Information about your medication: Brilinta (anti-platelet agent)  Generic Name (Brand): ticagrelor (Brilinta), twice daily medication  PURPOSE: You are taking this medication along with aspirin to lower your chance of having a heart attack, stroke, or blood clots in your heart stent. These can be fatal. Brilinta and aspirin help prevent platelets from sticking together and forming a clot that can block an artery or your stent.   Common SIDE EFFECTS you may experience include: bruising or bleeding more easily, shortness of breath  Do not stop taking BRILINTA without talking to the doctor who prescribes it for you. People who are treated with a stent and stop taking Brilinta too soon, have a higher risk of getting a blood clot in the stent, having a heart attack, or dying. If you stop Brilinta because of bleeding, or for other reasons, your risk of a heart attack or stroke may increase.   Tell all of your doctors and dentists that you are taking Brilinta. They  should talk to the doctor who prescribed Brilinta for you before you have any surgery or invasive procedure.   Contact your health care provider if you experience: severe or uncontrollable bleeding, pink/red/brown urine, vomiting blood or vomit that looks like "coffee grounds", red or black stools (looks like tar), coughing up blood or blood clots ----------------------------------------------------------------------------------------------------------------------

## 2019-08-07 NOTE — Progress Notes (Signed)
Discharge instructions given to patient and partner at bedside.  Addressed questions and concerns with teach back also done. PIVs x2 d/c'd with pressure dressings at sites. PharmD & Tech at bedside with medication instructions and delivery.  Ambulated in hallway x2 w/o difficulty.  All belongings, phone and cord taken by patient.  Awake, alert and oriented at time wheel out of department by RN.

## 2019-08-08 NOTE — Telephone Encounter (Signed)
**Note De-Identified Kenita Bines Obfuscation** Patient contacted regarding discharge from [facility] on 08/07/2019.  Patient understands to follow up with provider Richardson Dopp, PA-c on 08/12/2019 at 2:45 at [facility] in [place], [place] [address]. Patient understands discharge instructions? Yes Patient understands medications and regiment? Yes Patient understands to bring all medications to this visit? Yes

## 2019-08-08 NOTE — Telephone Encounter (Signed)
TCM Call 1st Attempt I left a message on the pts VM asking him to call me back.

## 2019-08-11 ENCOUNTER — Telehealth (HOSPITAL_COMMUNITY): Payer: Self-pay

## 2019-08-11 NOTE — Telephone Encounter (Signed)
Pt insurance is active and benefits verified through [place]. Co-pay $30.00, DED $1,000.00/$1,000.00 met, out of pocket $5,000.00/$2,421.34 met, co-insurance 0%. No pre-authorization required. Passport, 08/11/2019 @ 8:35AM , CHT#[id number]  Will contact patient to see if he is interested in the Cardiac Rehab Program. If interested, patient will need to complete follow up appt. Once completed, patient will be contacted for scheduling upon review by the RN Navigator.

## 2019-08-11 NOTE — Telephone Encounter (Signed)
Attempted to call patient in regards to Cardiac Rehab - LM on VM 

## 2019-08-11 NOTE — Telephone Encounter (Signed)
Pt returned CR phone call and stated he would like to participate in CR, explained scheduling process and went over insurance, patient verbalized understanding. Will contact patient for scheduling once f/u has been completed.

## 2019-08-11 NOTE — Progress Notes (Signed)
Cardiology Office Note:    Date:  08/12/2019   ID:  Jeremy Guerrero, DOB 09-Feb-1966, MRN [id number]  PCP:  Laurey Morale, MD  Cardiologist:  Larae Grooms, MD  Electrophysiologist:  None   Referring MD: Laurey Morale, MD   Chief Complaint  Patient presents with   Hospitalization Follow-up    s/p MI    History of Present Illness:    Jeremy Guerrero is a 53 y.o. male with:   Coronary artery disease  S/p NSTEMI 07/2019 >> PCI: DES to RCA  ACS 2? to plaque rupture in the RCA with distal embolization  Hypertension   Hyperlipidemia   OSA  Mr. Bass was admitted 11/17-11/19 with a NSTEMI.  Cardiac catheterization demonstrated mid RCA 65% stenosis and distal RCA 75% stenosis.  Acute coronary syndrome was felt to be due to plaque rupture with distal embolization in the ostium of the continuation of the RCA beyond the PDA.  Because of the suspected heavy plaque/thrombus burden in the distal RCA, the patient was kept on 12 to 24 hours of anticoagulation combined with 2b3a inhibitor and loaded with ticagrelor.  He was brought back 1 day later for intervention.  He underwent placement of a DES to the RCA.  Distal embolization remains at 60% stenosis.  The patient was not placed on beta-blocker therapy secondary to bradycardia. He was also not placed on ACE inhibitor due to soft BP.  EF was preserved on echocardiogram.  He returns for follow-up.  He is here alone.  Since discharge from the hospital, he has done well.  He has had some occasional tightness in his chest.  This is gradually gotten better since discharge.  He has not had any recurrent anginal symptoms.  He has not had significant shortness of breath.  He has not had syncope, orthopnea or significant leg swelling.  He has been somewhat anxious at times.  He is interested in cardiac rehabilitation.  He joined a Facebook group for patients who have had a heart attack prior to the age of 61.    Prior CV studies:   The  following studies were reviewed today:  Echocardiogram 08/06/2019 EF 55, grade 2 diastolic dysfunction, interventricular septum D-shaped suggesting a degree of RV pressure/volume overload, mild RAE  Percutaneous coronary intervention 08/06/2019 PCI: 3.5 x 34 mm resolute Onyx DES to the RCA Successful percutaneous coronary intervention to a large dominant distal RCA with residual 80 and 75% stenoses with residual thrombus and previously documented distal embolization to the continuation branch beyond the PDA takeoff.  The 80 and 75% stenoses were reduced to 0% with ultimate insertion of a 3.5 x 34 mm Resolute Onyx stent postdilated with stent taper from 4.06 to 3.9 mm.    RECOMMENDATION: DAPT therapy for minimum of 1 year. With residual thrombus in the continuation branch at completion of the procedure the patient will continue Aggrastat for an additional 18 to 24 hours.  Aggressive blood pressure control with target blood pressure less than 130/80, optimal LDL management with LDL cholesterol ideally in the 50s or below, weight reduction, continue treatment of obstructive sleep apnea, exercise and weight loss.   Cardiac catheterization 08/05/2019 LAD mid 58; D1 79 OM1 24 RCA mid 26, distal 59; RPAV 35 EF 60   Past Medical History:  Diagnosis Date   History of adenomatous polyp of colon 06/02/2016   Hyperlipidemia    Hypertension    NSTEMI (non-ST elevated myocardial infarction) ([place]) 08/05/2019   S/P angioplasty with stent 08/06/19  with DES to large dominant distal RCA  08/07/2019   Sciatica    Sleep apnea    wears cpap   Surgical Hx: The patient  has a past surgical history that includes Knee surgery (Left, 2015); Colonoscopy with propofol (N/A, 05/30/2016); LEFT HEART CATH AND CORONARY ANGIOGRAPHY (N/A, 08/05/2019); CORONARY STENT INTERVENTION (N/A, 08/06/2019); and Left Heart Cath (N/A, 08/06/2019).   Current Medications: Current Meds  Medication Sig   acetaminophen  (TYLENOL) 325 MG tablet Take 2 tablets (650 mg total) by mouth every 4 (four) hours as needed for headache or mild pain.   aspirin 81 MG chewable tablet Chew 1 tablet (81 mg total) by mouth daily.   atorvastatin (LIPITOR) 80 MG tablet Take 1 tablet (80 mg total) by mouth daily at 6 PM.   nitroGLYCERIN (NITROSTAT) 0.4 MG SL tablet Place 1 tablet (0.4 mg total) under the tongue every 5 (five) minutes x 3 doses as needed for chest pain.   ticagrelor (BRILINTA) 90 MG TABS tablet Take 1 tablet (90 mg total) by mouth 2 (two) times daily.     Allergies:   Patient has no known allergies.   Social History   Tobacco Use   Smoking status: Former Smoker    Packs/day: 1.00    Years: 30.00    Pack years: 30.00    Types: Cigarettes    Quit date: 11/27/2013    Years since quitting: 5.7   Smokeless tobacco: Never Used  Substance Use Topics   Alcohol use: Yes    Alcohol/week: 0.0 standard drinks    Comment: 6 beers a month   Drug use: No     Family Hx: The patient's family history includes Cancer in his maternal uncle; Diabetes in his father and maternal grandfather; Healthy in his brother, mother, sister, and sister; Heart attack in his maternal grandfather and maternal uncle; Ovarian cancer in his sister. There is no history of Colon cancer, Colon polyps, Esophageal cancer, Rectal cancer, or Stomach cancer.  ROS:   Please see the history of present illness.    ROS All other systems reviewed and are negative.   EKGs/Labs/Other Test Reviewed:    EKG:  EKG is  ordered today.  The ekg ordered today demonstrates normal sinus rhythm, heart rate 78, normal axis, nonspecific ST-T wave changes, QTC 430  Recent Labs: 07/15/2019: TSH 1.57 08/06/2019: ALT 16 08/07/2019: BUN 9; Creatinine, Ser 0.73; Hemoglobin 15.7; Magnesium 2.0; Platelets 191; Potassium 3.8; Sodium 137   Recent Lipid Panel Lab Results  Component Value Date/Time   CHOL 194 07/15/2019 08:41 AM   TRIG 160.0 (H) 07/15/2019  08:41 AM   HDL 29.40 (L) 07/15/2019 08:41 AM   CHOLHDL 7 07/15/2019 08:41 AM   LDLCALC 132 (H) 07/15/2019 08:41 AM   LDLDIRECT 143.2 10/28/2013 09:04 AM    Physical Exam:    VS:  BP 128/82    Pulse 90    Ht 6' (1.829 m)    Wt (!) 356 lb 1.9 oz (161.5 kg)    SpO2 95%    BMI 48.30 kg/m     Wt Readings from Last 3 Encounters:  08/12/19 (!) 356 lb 1.9 oz (161.5 kg)  08/07/19 (!) 364 lb 6.7 oz (165.3 kg)  07/15/19 (!) 362 lb 3.2 oz (164.3 kg)     Physical Exam  Constitutional: He is oriented to person, place, and time. He appears well-developed and well-nourished. No distress.  HENT:  Head: Normocephalic and atraumatic.  Eyes: No scleral icterus.  Neck: No  JVD present. No thyromegaly present.  Cardiovascular: Normal rate and regular rhythm.  No murmur heard. Pulmonary/Chest: Effort normal. He has no rales.  Abdominal: Soft.  Musculoskeletal:        General: No edema.     Comments: R wrist without hematoma; + ecchymosis noted   Lymphadenopathy:    He has no cervical adenopathy.  Neurological: He is alert and oriented to person, place, and time.  Skin: Skin is warm and dry.  Psychiatric: He has a normal mood and affect.    ASSESSMENT & PLAN:    1. NSTEMI (non-ST elevation myocardial infarction) ([place]) Status post recent Non-ST elevation myocardial infarction treated with drug-eluting stent to the RCA.  There was distal embolization to the distal branch beyond the RCA.  He is currently doing well without any significant angina.  He has had some chest tightness that seems to be resolving.  Question if this is related to ticagrelor.  ECG is without acute changes.  We discussed the importance of dual antiplatelet therapy for the next 12 months.  He was not placed on beta-blocker therapy in the hospital due to low heart rate and low blood pressure.  However, his blood pressure and heart rate should be able to tolerate low-dose beta-blocker at this time.  We discussed the mortality benefit  of beta-blockers post MI.  He is a Programmer, applications.    -Continue aspirin, Ticagrelor, atorvastatin  -Start metoprolol succinate 25 mg every evening  -Okay to start cardiac rehabilitation  -Okay to resume work next week   2. Essential hypertension The patient's blood pressure is controlled on his current regimen.  Continue current therapy.   3. Mixed hyperlipidemia Continue high-dose statin therapy.  Arrange fasting lipids LFTs in 6 to 8 weeks.     Dispo:  Return in about 3 months (around 11/12/2019) for Routine Follow Up w/ Dr. Irish Lack, (virtual or in-person).   Medication Adjustments/Labs and Tests Ordered: Current medicines are reviewed at length with the patient today.  Concerns regarding medicines are outlined above.  Tests Ordered: Orders Placed This Encounter  Procedures   Hepatic function panel   Lipid panel   EKG 12-Lead   Medication Changes: Meds ordered this encounter  Medications   metoprolol succinate (TOPROL XL) 25 MG 24 hr tablet    Sig: Take 1 tablet (25 mg total) by mouth daily.    Dispense:  90 tablet    Refill:  3    Signed, Richardson Dopp, PA-C  08/12/2019 3:34 PM    [facility] [address], [place], [place]  [address] Phone: [phone]; Fax: [phone]

## 2019-08-12 ENCOUNTER — Encounter: Payer: Self-pay | Admitting: Physician Assistant

## 2019-08-12 ENCOUNTER — Ambulatory Visit: Payer: BLUE CROSS/BLUE SHIELD | Admitting: Physician Assistant

## 2019-08-12 ENCOUNTER — Other Ambulatory Visit: Payer: Self-pay

## 2019-08-12 VITALS — BP 128/82 | HR 90 | Ht 72.0 in | Wt 356.1 lb

## 2019-08-12 DIAGNOSIS — I214 Non-ST elevation (NSTEMI) myocardial infarction: Secondary | ICD-10-CM

## 2019-08-12 DIAGNOSIS — E782 Mixed hyperlipidemia: Secondary | ICD-10-CM

## 2019-08-12 DIAGNOSIS — I10 Essential (primary) hypertension: Secondary | ICD-10-CM | POA: Diagnosis not present

## 2019-08-12 MED ORDER — METOPROLOL SUCCINATE ER 25 MG PO TB24: 25.0000 mg | ORAL_TABLET | Freq: Every day | ORAL | 3 refills | Status: DC

## 2019-08-12 MED FILL — METOPROLOL SUCCINATE ER 25: 25 | 90 days supply | Qty: 90 | Fill #0

## 2019-08-12 NOTE — Patient Instructions (Addendum)
Medication Instructions:  Your physician has recommended you make the following change in your medication:   1. START TOPROL XL 25 MG DAILY IN THE PM  *If you need a refill on your cardiac medications before your next appointment, please call your pharmacy*  Lab Work: TO BE DONE IN 6-8 WEEKS: LFTS, LIPIDS  If you have labs (blood work) drawn today and your tests are completely normal, you will receive your results only by: Marland Kitchen MyChart Message (if you have MyChart) OR . A paper copy in the mail If you have any lab test that is abnormal or we need to change your treatment, we will call you to review the results.  Testing/Procedures: NONE  Follow-Up:  Your next appointment:   3 month(s)  The format for your next appointment:   In Person  Provider:   DR. VARANASI

## 2019-08-13 ENCOUNTER — Telehealth: Payer: Self-pay | Admitting: Physician Assistant

## 2019-08-13 ENCOUNTER — Encounter (HOSPITAL_COMMUNITY): Payer: Self-pay

## 2019-08-13 ENCOUNTER — Telehealth (HOSPITAL_COMMUNITY): Payer: Self-pay

## 2019-08-13 ENCOUNTER — Inpatient Hospital Stay: Payer: BLUE CROSS/BLUE SHIELD | Admitting: Family Medicine

## 2019-08-13 ENCOUNTER — Telehealth: Payer: Self-pay | Admitting: Interventional Cardiology

## 2019-08-13 NOTE — Telephone Encounter (Signed)
Pt called stating that he has had blood in his urine three times today. He is not dizzy and no syncope. He is scheduled to see his PCP on Monday. In consultation with Dr. Ellyn Hack, he may hold his ASA and continue brilinta until he is evaluated. Of the bleeding gets worse, he will report to the ED.

## 2019-08-13 NOTE — Telephone Encounter (Signed)
New Message  Pt is calling and stating that he has a question to ask a Dr or nurse. Had a small trace of blood in urine and wants to know if that is normal or not.  Please call.

## 2019-08-13 NOTE — Telephone Encounter (Signed)
Follow Up  Patient returning call. Please give patient a call back.  

## 2019-08-13 NOTE — Telephone Encounter (Signed)
Attempted to call patient in regards to Cardiac Rehab - LM on VM Mailed letter 

## 2019-08-13 NOTE — Telephone Encounter (Signed)
Left message to call back  

## 2019-08-13 NOTE — Telephone Encounter (Signed)
Patient returning call.

## 2019-08-13 NOTE — Telephone Encounter (Signed)
Attempted to call patient but there was no answer and VM did not pick up.

## 2019-08-15 DIAGNOSIS — Z20828 Contact with and (suspected) exposure to other viral communicable diseases: Secondary | ICD-10-CM | POA: Diagnosis not present

## 2019-08-18 ENCOUNTER — Ambulatory Visit: Payer: BLUE CROSS/BLUE SHIELD | Admitting: Family Medicine

## 2019-08-18 ENCOUNTER — Other Ambulatory Visit: Payer: Self-pay

## 2019-08-18 ENCOUNTER — Encounter: Payer: Self-pay | Admitting: Family Medicine

## 2019-08-18 VITALS — BP 120/80 | HR 90 | Temp 98.2°F | Ht 72.0 in | Wt 348.0 lb

## 2019-08-18 DIAGNOSIS — I214 Non-ST elevation (NSTEMI) myocardial infarction: Secondary | ICD-10-CM

## 2019-08-18 DIAGNOSIS — I10 Essential (primary) hypertension: Secondary | ICD-10-CM

## 2019-08-18 DIAGNOSIS — Z95820 Peripheral vascular angioplasty status with implants and grafts: Secondary | ICD-10-CM | POA: Diagnosis not present

## 2019-08-18 DIAGNOSIS — E785 Hyperlipidemia, unspecified: Secondary | ICD-10-CM

## 2019-08-18 NOTE — Progress Notes (Signed)
   Subjective:    Patient ID: Jeremy Guerrero, male    DOB: Jul 01, 1966, 53 y.o.   MRN: [id number]  HPI Here to follow up a hospital stay from 08-05-19 to 08-07-19 for a NSTEMI. Angioplasty revealed a stenosis in the RCA and he had a drug eluting stent placed. He will be on DAPT for 12 months with ASA and Brilinta. He is on a low dose beta blocker and high dose statin. He had a follow up visit with Cardiology on 08-12-19 and he seemed to be doing well. He had a few instances of blood in the urine so he stopped taking the ASA. He has not seen any blood since then. He has some mild tightness in the chest when he exerts himself, but this quickly stops when he rests. He has not taken any NTG yet. He walks at the gym twice a day for short distances he is scheduled to begin cardiac rehab next month. He asks about returning to work tomorrow (which would be 14 days out from the event) and when he can resume sex. He is watching his diet closely and he has lost 16 lbs in the past 2 weeks .   Review of Systems  Constitutional: Negative.   Respiratory: Positive for chest tightness. Negative for cough, shortness of breath and wheezing.   Cardiovascular: Negative.   Neurological: Negative.        Objective:   Physical Exam Constitutional:      Appearance: He is obese.  Cardiovascular:     Rate and Rhythm: Normal rate and regular rhythm.     Pulses: Normal pulses.     Heart sounds: Normal heart sounds.  Pulmonary:     Effort: Pulmonary effort is normal.     Breath sounds: Normal breath sounds.  Musculoskeletal:     Right lower leg: No edema.     Left lower leg: No edema.  Neurological:     General: No focal deficit present.     Mental Status: He is alert and oriented to person, place, and time.           Assessment & Plan:  He is recovering from a recent NSTEMI and has a stent in place. His BP is well managed on low dose beta blocker. He has stopped ASA due to hematuria but will stay on  Brilinta. 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 He is cleared to resume sex in 2 more weeks. He will have his lipids checked in about a month. He will follow up with Dr. Irish Lack in February.  Alysia Penna, MD

## 2019-08-18 NOTE — Patient Instructions (Signed)
There are no preventive care reminders to display for this patient.  Depression screen [facility] 2/9 05/30/2016 04/15/2015 07/20/2014  Decreased Interest 0 0 0  Down, Depressed, Hopeless 0 0 0  PHQ - 2 Score 0 0 0

## 2019-08-20 ENCOUNTER — Ambulatory Visit: Payer: BLUE CROSS/BLUE SHIELD | Admitting: Pharmacist

## 2019-08-20 NOTE — Telephone Encounter (Signed)
Spoke with patient who states that he was having blood in his urine last week. He spoke with his PCP on Monday who suggested he come off of his Brilinta, however the patient did not feel comfortable doing so. Instead it was suggested that he discontinue ASA, which he agreed with. Since then he has not had any more blood in his urine.

## 2019-08-20 NOTE — Telephone Encounter (Signed)
Dr. Irish Lack, pt had hematuria and PCP stopped ASA , new stent with clot - I am not sure why this was sent to me but please address

## 2019-08-21 NOTE — Telephone Encounter (Signed)
Called and made patient aware that it is okay to stay off of the ASA and to continue Brilinta. Denies hematuria or any other S/Sx of bleeding at this time since stopping the ASA. Instructed patient to let us know if his Sx change or worsen. Patient verbalized understanding and thanked me for the call.

## 2019-08-21 NOTE — Telephone Encounter (Signed)
OK to stay on Brilinta monotherapy and see if hematuria improves.

## 2019-08-27 ENCOUNTER — Ambulatory Visit: Payer: BLUE CROSS/BLUE SHIELD | Admitting: Pharmacist

## 2019-09-04 ENCOUNTER — Other Ambulatory Visit: Payer: Self-pay

## 2019-09-04 ENCOUNTER — Telehealth: Payer: Self-pay | Admitting: Interventional Cardiology

## 2019-09-04 MED ORDER — ATORVASTATIN CALCIUM 80 MG PO TABS: 80.0000 mg | ORAL_TABLET | Freq: Every day | ORAL | 6 refills | Status: DC

## 2019-09-04 MED ORDER — TICAGRELOR 90 MG PO TABS: 90.0000 mg | ORAL_TABLET | Freq: Two times a day (BID) | ORAL | 11 refills | Status: DC

## 2019-09-04 MED FILL — BRILINTA 90 MG TABLET: 90 | 30 days supply | Qty: 60 | Fill #0

## 2019-09-04 MED FILL — ATORVASTATIN 80 MG TABLET: 80 | 30 days supply | Qty: 30 | Fill #0

## 2019-09-04 NOTE — Telephone Encounter (Signed)
New message   Patient is out of medication and needs today.    *STAT* If patient is at the pharmacy, call can be transferred to refill team.   1. Which medications need to be refilled? (please list name of each medication and dose if known) ticagrelor (BRILINTA) 90 MG TABS tablet   atorvastatin (LIPITOR) 80 MG tablet 2. Which pharmacy/location (including street and city if local pharmacy) is medication to be sent to? [place], [place]  3. Do they need a 30 day or 90 day supply? 90 day supply

## 2019-09-05 ENCOUNTER — Telehealth (HOSPITAL_COMMUNITY): Payer: Self-pay

## 2019-09-05 NOTE — Telephone Encounter (Signed)
Cardiac Rehab Medication Review by a Pharmacist  Does the patient  feel that his/her medications are working for him/her?  yes  Has the patient been experiencing any side effects to the medications prescribed?  no  Does the patient measure his/her own blood pressure or blood glucose at home?  no   Does the patient have any problems obtaining medications due to transportation or finances?   no  Understanding of regimen: good Understanding of indications: good Potential of compliance: excellent    Pharmacist comments: The patient has no complaints, no issues obtaining his medications, has a clear understanding of his medication indications, and states he has not missed any doses of his medications. No signs or symptoms of bleeding while taking Brilinta either.   Jeremy Guerrero, PharmD PGY1 Pharmacy Resident

## 2019-09-05 NOTE — Telephone Encounter (Signed)
Called to advise pt that given the surge/increase in the Covid-19 cases and hospitalizations, our [place] senior leadership has asked [place] to halt onsite Cardiac and Pulmonary rehab exercise sessions and move patients to the Virtual app we have with [employer]. The reason for this is so that department staff can be deployed to the inpatient nursing floors to assist those teams in need. Leadership anticipates this need will be 30-60 days however, it could be extended if needed. Advised pt to attend the Orientation assessment appt for 09/09/2019@7 :30am. At that time he will receive additional information about our [employer]. Pt verbalized understanding and is in agreement.

## 2019-09-08 ENCOUNTER — Telehealth (HOSPITAL_COMMUNITY): Payer: Self-pay

## 2019-09-08 NOTE — Telephone Encounter (Signed)
Unsuccessful telephone encounter to Mr. Jeremy Guerrero to confirm his Cardiac Rehab Orientation appointment for tomorrow 09/09/2019 at 0730. HIPAA compliant VM message left requesting call back at [phone]. Severiano Utsey E. Laray Anger, BSN

## 2019-09-08 NOTE — Telephone Encounter (Signed)
Successful telephone encounter to Jeremy Guerrero to confirm Cardiac Rehab orientation appointment for 09/09/2019 at 0730. Nursing assessment to be completed upon arrival. Instructions for appointment provided. Patient screening for Covid-19 negative. Quinlan Mcfall E. Laray Anger, BSN

## 2019-09-09 ENCOUNTER — Encounter (HOSPITAL_COMMUNITY)
Admission: RE | Admit: 2019-09-09 | Discharge: 2019-09-09 | Disposition: A | Discharge status: Home / Self Care | Payer: BLUE CROSS/BLUE SHIELD | Source: Ambulatory Visit | Attending: Interventional Cardiology | Admitting: Interventional Cardiology

## 2019-09-09 ENCOUNTER — Encounter (HOSPITAL_COMMUNITY): Payer: Self-pay

## 2019-09-09 ENCOUNTER — Other Ambulatory Visit: Payer: Self-pay

## 2019-09-09 VITALS — BP 116/64 | HR 83 | Temp 97.9°F | Resp 18 | Ht 70.25 in | Wt 343.3 lb

## 2019-09-09 DIAGNOSIS — Z95820 Peripheral vascular angioplasty status with implants and grafts: Secondary | ICD-10-CM

## 2019-09-09 DIAGNOSIS — I214 Non-ST elevation (NSTEMI) myocardial infarction: Secondary | ICD-10-CM

## 2019-09-09 NOTE — Progress Notes (Signed)
Spoke to pt regarding Virtual Cardiac  and Pulmonary Rehab.  Pt  was able to download the [employer] app on their smart device with no issues. Pt set up their account and received the following welcome message -"Welcome to the [facility] and Pulmonary Rehabilitation program. We hope that you will find the exercise program beneficial in your recovery process. Our staff is available to assist with any questions/concerns about your exercise routine. Best wishes". Brief orientation provided to with the advisement to watch the "Intro to Rehab" series located under the Resource tab. Pt verbalized understanding. Will continue to follow and monitor pt progress with feedback as needed. Alim Cattell E. Laray Anger, BSN

## 2019-09-09 NOTE — Progress Notes (Signed)
Cardiac Individual Treatment Plan  Patient Details  Name: Jeremy Guerrero MRN: [id number] Date of Birth: 10-13-1965 Referring Provider:     CARDIAC REHAB PHASE II ORIENTATION from 09/09/2019 in [place]  Referring Provider  Dr. Irish Lack      Initial Encounter Date:    CARDIAC REHAB PHASE II ORIENTATION from 09/09/2019 in [place]  Date  09/09/19      Visit Diagnosis: NSTEMI (non-ST elevated myocardial infarction) ([place])  S/P angioplasty with stent 08/06/19 with DES to large dominant distal RCA   Patient's Home Medications on Admission:  Current Outpatient Medications:  .  acetaminophen (TYLENOL) 325 MG tablet, Take 2 tablets (650 mg total) by mouth every 4 (four) hours as needed for headache or mild pain., Disp: , Rfl:  .  atorvastatin (LIPITOR) 80 MG tablet, Take 1 tablet (80 mg total) by mouth daily at 6 PM., Disp: 30 tablet, Rfl: 6 .  metoprolol succinate (TOPROL XL) 25 MG 24 hr tablet, Take 1 tablet (25 mg total) by mouth daily., Disp: 90 tablet, Rfl: 3 .  ticagrelor (BRILINTA) 90 MG TABS tablet, Take 1 tablet (90 mg total) by mouth 2 (two) times daily., Disp: 60 tablet, Rfl: 11 .  nitroGLYCERIN (NITROSTAT) 0.4 MG SL tablet, Place 1 tablet (0.4 mg total) under the tongue every 5 (five) minutes x 3 doses as needed for chest pain. (Patient not taking: Reported on 09/09/2019), Disp: 25 tablet, Rfl: 4  Past Medical History: Past Medical History:  Diagnosis Date  . History of adenomatous polyp of colon 06/02/2016  . Hyperlipidemia   . Hypertension   . NSTEMI (non-ST elevated myocardial infarction) ([place]) 08/05/2019  . S/P angioplasty with stent 08/06/19 with DES to large dominant distal RCA  08/07/2019  . Sciatica   . Sleep apnea    wears cpap    Tobacco Use: Social History   Tobacco Use  Smoking Status Former Smoker  . Packs/day: 1.00  . Years: 30.00  . Pack years: 30.00  . Types: Cigarettes  . Quit  date: 11/27/2013  . Years since quitting: 5.7  Smokeless Tobacco Never Used    Labs: Recent Review Scientist, physiological    Labs for ITP Cardiac and Pulmonary Rehab Latest Ref Rng & Units 06/14/2010 10/28/2013 01/26/2016 07/15/2019 08/06/2019   Cholestrol 0 - 200 mg/dL 262(H) 203(H) 234(H) 194 -   LDLCALC 0 - 99 mg/dL - - 163(H) 132(H) -   LDLDIRECT mg/dL 188.1 143.2 - - -   HDL >39.00 mg/dL 36.60(L) 29.00(L) 34.20(L) 29.40(L) -   Trlycerides 0.0 - 149.0 mg/dL 188.0(H) 202.0(H) 185.0(H) 160.0(H) -   Hemoglobin A1c 4.8 - 5.6 % - - - 5.7 5.6   TCO2 22 - 32 mmol/L - - - - 25      Capillary Blood Glucose: No results found for: GLUCAP   Exercise Target Goals: Exercise Program Goal: Individual exercise prescription set using results from initial 6 min walk test and THRR while considering  patient's activity barriers and safety.   Exercise Prescription Goal: Starting with aerobic activity 30 plus minutes a day, 3 days per week for initial exercise prescription. Provide home exercise prescription and guidelines that participant acknowledges understanding prior to discharge.  Activity Barriers & Risk Stratification: Activity Barriers & Cardiac Risk Stratification - 09/09/19 0933      Activity Barriers & Cardiac Risk Stratification   Activity Barriers  None    Cardiac Risk Stratification  High  6 Minute Walk: 6 Minute Walk    Row Name 09/09/19 0932         6 Minute Walk   Phase  Initial     Distance  1824 feet     Walk Time  6 minutes     # of Rest Breaks  0     MPH  3.4     METS  3.6     RPE  11     Perceived Dyspnea   0     VO2 Peak  12.7     Symptoms  No     Resting HR  83 bpm     Resting BP  116/64     Resting Oxygen Saturation   96 %     Exercise Oxygen Saturation  during 6 min walk  97 %     Max Ex. HR  123 bpm     Max Ex. BP  134/68     2 Minute Post BP  116/66        Oxygen Initial Assessment:   Oxygen Re-Evaluation:   Oxygen Discharge (Final Oxygen  Re-Evaluation):   Initial Exercise Prescription: Initial Exercise Prescription - 09/09/19 0900      Date of Initial Exercise RX and Referring Provider   Date  09/09/19    Referring Provider  Dr. Irish Lack    Expected Discharge Date  11/07/19      Treadmill   MPH  3    Grade  1    Minutes  15      NuStep   Level  3    SPM  85    Minutes  15    METs  3.2      Prescription Details   Frequency (times per week)  3    Duration  Progress to 30 minutes of continuous aerobic without signs/symptoms of physical distress      Intensity   THRR 40-80% of Max Heartrate  66-133    Ratings of Perceived Exertion  11-13      Progression   Progression  Continue to progress workloads to maintain intensity without signs/symptoms of physical distress.      Resistance Training   Training Prescription  Yes    Weight  5 lbs.     Reps  10-15       Perform Capillary Blood Glucose checks as needed.  Exercise Prescription Changes:   Exercise Comments:   Exercise Goals and Review:  Exercise Goals    Row Name 09/09/19 0935             Exercise Goals   Increase Physical Activity  Yes       Intervention  Provide advice, education, support and counseling about physical activity/exercise needs.;Develop an individualized exercise prescription for aerobic and resistive training based on initial evaluation findings, risk stratification, comorbidities and participant's personal goals.       Expected Outcomes  Short Term: Attend rehab on a regular basis to increase amount of physical activity.;Long Term: Add in home exercise to make exercise part of routine and to increase amount of physical activity.;Long Term: Exercising regularly at least 3-5 days a week.       Increase Strength and Stamina  Yes       Intervention  Provide advice, education, support and counseling about physical activity/exercise needs.;Develop an individualized exercise prescription for aerobic and resistive training based on  initial evaluation findings, risk stratification, comorbidities and participant's personal goals.  Expected Outcomes  Short Term: Increase workloads from initial exercise prescription for resistance, speed, and METs.;Short Term: Perform resistance training exercises routinely during rehab and add in resistance training at home;Long Term: Improve cardiorespiratory fitness, muscular endurance and strength as measured by increased METs and functional capacity (6MWT)       Able to understand and use rate of perceived exertion (RPE) scale  Yes       Intervention  Provide education and explanation on how to use RPE scale       Expected Outcomes  Short Term: Able to use RPE daily in rehab to express subjective intensity level;Long Term:  Able to use RPE to guide intensity level when exercising independently       Knowledge and understanding of Target Heart Rate Range (THRR)  Yes       Intervention  Provide education and explanation of THRR including how the numbers were predicted and where they are located for reference       Expected Outcomes  Short Term: Able to state/look up THRR;Long Term: Able to use THRR to govern intensity when exercising independently;Short Term: Able to use daily as guideline for intensity in rehab       Able to check pulse independently  Yes       Intervention  Provide education and demonstration on how to check pulse in carotid and radial arteries.;Review the importance of being able to check your own pulse for safety during independent exercise       Expected Outcomes  Short Term: Able to explain why pulse checking is important during independent exercise;Long Term: Able to check pulse independently and accurately       Understanding of Exercise Prescription  Yes       Intervention  Provide education, explanation, and written materials on patient's individual exercise prescription       Expected Outcomes  Short Term: Able to explain program exercise prescription;Long Term:  Able to explain home exercise prescription to exercise independently          Exercise Goals Re-Evaluation :    Discharge Exercise Prescription (Final Exercise Prescription Changes):   Nutrition:  Target Goals: Understanding of nutrition guidelines, daily intake of sodium 1500mg , cholesterol 200mg , calories 30% from fat and 7% or less from saturated fats, daily to have 5 or more servings of fruits and vegetables.  Biometrics: Pre Biometrics - 09/09/19 0936      Pre Biometrics   Height  5' 10.25" (1.784 m)    Weight  (!) 155.7 kg    Waist Circumference  51.5 inches    Hip Circumference  58.5 inches    Waist to Hip Ratio  0.88 %    BMI (Calculated)  48.92    Triceps Skinfold  28 mm    % Body Fat  42.8 %    Grip Strength  38 kg    Flexibility  9.5 in    Single Leg Stand  30 seconds        Nutrition Therapy Plan and Nutrition Goals:   Nutrition Assessments:   Nutrition Goals Re-Evaluation:   Nutrition Goals Discharge (Final Nutrition Goals Re-Evaluation):   Psychosocial: Target Goals: Acknowledge presence or absence of significant depression and/or stress, maximize coping skills, provide positive support system. Participant is able to verbalize types and ability to use techniques and skills needed for reducing stress and depression.  Initial Review & Psychosocial Screening: Initial Psych Review & Screening - 09/09/19 0935      Initial Review  Current issues with  None Identified      Family Dynamics   Good Support System?  Yes    Comments  Mr. Woulard denies barriers to participation in CR and self health management. He has a positive attitude and outlook. He acknolodges a strong support system including family, friends, and health care team. No psychosocial interventions needed at this time.      Barriers   Psychosocial barriers to participate in program  There are no identifiable barriers or psychosocial needs.      Screening Interventions    Interventions  Encouraged to exercise       Quality of Life Scores: Quality of Life - 09/09/19 0938      Quality of Life   Select  Quality of Life      Quality of Life Scores   Health/Function Pre  19.2 %    Socioeconomic Pre  22.14 %    Psych/Spiritual Pre  25.14 %    Family Pre  21.63 %    GLOBAL Pre  21.38 %      Scores of 19 and below usually indicate a poorer quality of life in these areas.  A difference of  2-3 points is a clinically meaningful difference.  A difference of 2-3 points in the total score of the Quality of Life Index has been associated with significant improvement in overall quality of life, self-image, physical symptoms, and general health in studies assessing change in quality of life.  PHQ-9: Recent Review Flowsheet Data    Depression screen [facility] 2/9 09/09/2019 05/30/2016 04/15/2015 07/20/2014 06/30/2014   Decreased Interest 0 0 0 0 0   Down, Depressed, Hopeless 0 0 0 0 0   PHQ - 2 Score 0 0 0 0 0     Interpretation of Total Score  Total Score Depression Severity:  1-4 = Minimal depression, 5-9 = Mild depression, 10-14 = Moderate depression, 15-19 = Moderately severe depression, 20-27 = Severe depression   Psychosocial Evaluation and Intervention:   Psychosocial Re-Evaluation:   Psychosocial Discharge (Final Psychosocial Re-Evaluation):   Vocational Rehabilitation: Provide vocational rehab assistance to qualifying candidates.   Vocational Rehab Evaluation & Intervention:   Education: Education Goals: Education classes will be provided on a weekly basis, covering required topics. Participant will state understanding/return demonstration of topics presented.  Learning Barriers/Preferences: Learning Barriers/Preferences - 09/09/19 0939      Learning Barriers/Preferences   Learning Barriers  None    Learning Preferences  Written Material       Education Topics: Hypertension, Hypertension Reduction -Define heart disease and high blood  pressure. Discus how high blood pressure affects the body and ways to reduce high blood pressure.   Exercise and Your Heart -Discuss why it is important to exercise, the FITT principles of exercise, normal and abnormal responses to exercise, and how to exercise safely.   Angina -Discuss definition of angina, causes of angina, treatment of angina, and how to decrease risk of having angina.   Cardiac Medications -Review what the following cardiac medications are used for, how they affect the body, and side effects that may occur when taking the medications.  Medications include Aspirin, Beta blockers, calcium channel blockers, ACE Inhibitors, angiotensin receptor blockers, diuretics, digoxin, and antihyperlipidemics.   Congestive Heart Failure -Discuss the definition of CHF, how to live with CHF, the signs and symptoms of CHF, and how keep track of weight and sodium intake.   Heart Disease and Intimacy -Discus the effect sexual activity has on the  heart, how changes occur during intimacy as we age, and safety during sexual activity.   Smoking Cessation / COPD -Discuss different methods to quit smoking, the health benefits of quitting smoking, and the definition of COPD.   Nutrition I: Fats -Discuss the types of cholesterol, what cholesterol does to the heart, and how cholesterol levels can be controlled.   Nutrition II: Labels -Discuss the different components of food labels and how to read food label   Heart Parts/Heart Disease and PAD -Discuss the anatomy of the heart, the pathway of blood circulation through the heart, and these are affected by heart disease.   Stress I: Signs and Symptoms -Discuss the causes of stress, how stress may lead to anxiety and depression, and ways to limit stress.   Stress II: Relaxation -Discuss different types of relaxation techniques to limit stress.   Warning Signs of Stroke / TIA -Discuss definition of a stroke, what the signs and  symptoms are of a stroke, and how to identify when someone is having stroke.   Knowledge Questionnaire Score: Knowledge Questionnaire Score - 09/09/19 0940      Knowledge Questionnaire Score   Pre Score  24/24       Core Components/Risk Factors/Patient Goals at Admission: Personal Goals and Risk Factors at Admission - 09/09/19 0939      Core Components/Risk Factors/Patient Goals on Admission    Weight Management  Yes;Obesity;Weight Maintenance;Weight Loss    Intervention  Weight Management: Develop a combined nutrition and exercise program designed to reach desired caloric intake, while maintaining appropriate intake of nutrient and fiber, sodium and fats, and appropriate energy expenditure required for the weight goal.;Weight Management: Provide education and appropriate resources to help participant work on and attain dietary goals.;Weight Management/Obesity: Establish reasonable short term and long term weight goals.;Obesity: Provide education and appropriate resources to help participant work on and attain dietary goals.    Admit Weight  343 lb 4.1 oz (155.7 kg)    Goal Weight: Short Term  293 lb (132.9 kg)    Goal Weight: Long Term  200 lb (90.7 kg)    Expected Outcomes  Short Term: Continue to assess and modify interventions until short term weight is achieved;Long Term: Adherence to nutrition and physical activity/exercise program aimed toward attainment of established weight goal;Weight Maintenance: Understanding of the daily nutrition guidelines, which includes 25-35% calories from fat, 7% or less cal from saturated fats, less than 200mg  cholesterol, less than 1.5gm of sodium, & 5 or more servings of fruits and vegetables daily;Weight Loss: Understanding of general recommendations for a balanced deficit meal plan, which promotes 1-2 lb weight loss per week and includes a negative energy balance of [phone] kcal/d;Understanding recommendations for meals to include 15-35% energy as  protein, 25-35% energy from fat, 35-60% energy from carbohydrates, less than 200mg  of dietary cholesterol, 20-35 gm of total fiber daily;Understanding of distribution of calorie intake throughout the day with the consumption of 4-5 meals/snacks    Hypertension  Yes    Intervention  Provide education on lifestyle modifcations including regular physical activity/exercise, weight management, moderate sodium restriction and increased consumption of fresh fruit, vegetables, and low fat dairy, alcohol moderation, and smoking cessation.;Monitor prescription use compliance.    Expected Outcomes  Short Term: Continued assessment and intervention until BP is < 140/19mm HG in hypertensive participants. < 130/53mm HG in hypertensive participants with diabetes, heart failure or chronic kidney disease.;Long Term: Maintenance of blood pressure at goal levels.    Lipids  Yes  Intervention  Provide education and support for participant on nutrition & aerobic/resistive exercise along with prescribed medications to achieve LDL 70mg , HDL >40mg .    Expected Outcomes  Short Term: Participant states understanding of desired cholesterol values and is compliant with medications prescribed. Participant is following exercise prescription and nutrition guidelines.;Long Term: Cholesterol controlled with medications as prescribed, with individualized exercise RX and with personalized nutrition plan. Value goals: LDL < 70mg , HDL > 40 mg.       Core Components/Risk Factors/Patient Goals Review:    Core Components/Risk Factors/Patient Goals at Discharge (Final Review):    ITP Comments: ITP Comments    Row Name 09/09/19 0810           ITP Comments  Dr. Fransico Him, Medical Director Cardiac Rehab [facility]          Comments: Patient attended orientation on 09/09/2019 to review rules and guidelines for program.  Completed 6 minute walk test, Intitial ITP, and exercise prescription.  VSS. Telemetry-NSR.  Asymptomatic.  Safety measures and social distancing in place per CDC guidelines.

## 2019-09-09 NOTE — Progress Notes (Signed)
         Confirm Consent - In the setting of the current Covid19 crisis, you are scheduled for a phone visit with your Cardiac or Pulmonary team member.  Just as we do with many in-gym visits, in order for you to participate in this visit, we must obtain consent.  If you'd like, I can send this to your mychart (if signed up) or email for you to review.  Otherwise, I can obtain your verbal consent now.  By agreeing to a telephone visit, we'd like you to understand that the technology does not allow for your Cardiac or Pulmonary Rehab team member to perform a physical assessment, and thus may limit their ability to fully assess your ability to perform exercise programs. If your provider identifies any concerns that need to be evaluated in person, we will make arrangements to do so.  Finally, though the technology is pretty good, we cannot assure that it will always work on either your or our end and we cannot ensure that we have a secure connection.  Cardiac and Pulmonary Rehab Telehealth visits and "At Home" cardiac and pulmonary rehab are provided at no cost to you.        Are you willing to proceed?"        STAFF: Did the patient verbally acknowledge consent to telehealth visit? Document YES/NO here: Yes     Tsuyako Jolley E. Laray Anger, BSN  Cardiac and Pulmonary Rehab Staff        Date 09/10/19  @ Time [phone]

## 2019-09-15 ENCOUNTER — Encounter (HOSPITAL_COMMUNITY)
Admission: RE | Admit: 2019-09-15 | Discharge: 2019-09-15 | Disposition: A | Discharge status: Home / Self Care | Payer: BLUE CROSS/BLUE SHIELD | Source: Ambulatory Visit | Attending: Interventional Cardiology | Admitting: Interventional Cardiology

## 2019-09-15 ENCOUNTER — Other Ambulatory Visit: Payer: Self-pay

## 2019-09-15 ENCOUNTER — Ambulatory Visit (HOSPITAL_COMMUNITY): Payer: BLUE CROSS/BLUE SHIELD

## 2019-09-15 DIAGNOSIS — Z95820 Peripheral vascular angioplasty status with implants and grafts: Secondary | ICD-10-CM | POA: Diagnosis not present

## 2019-09-15 DIAGNOSIS — I214 Non-ST elevation (NSTEMI) myocardial infarction: Secondary | ICD-10-CM | POA: Diagnosis not present

## 2019-09-15 NOTE — Progress Notes (Signed)
Daily Session Note  Patient Details  Name: Jeremy Guerrero MRN: [id number] Date of Birth: 14-Feb-1966 Referring Provider:     CARDIAC REHAB PHASE II ORIENTATION from 09/09/2019 in [place]  Referring Provider  Dr. Irish Lack      Encounter Date: 09/15/2019  Check In: Session Check In - 09/15/19 0715      Check-In   Supervising physician immediately available to respond to emergencies  [facility] immediately available    Physician(s)  Dr. Nevada Crane    Location  [facility]    Staff Present  Jiles Garter, RN, BSN;Dalton Kris Mouton, MS, Exercise Physiologist;Tyara Carol Ada, MS,ACSM CEP, Exercise Physiologist;Portia Rollene Rotunda, RN, BSN    Virtual Visit  No    Medication changes reported      No    Fall or balance concerns reported     No    Tobacco Cessation  No Change    Warm-up and Cool-down  Performed on first and last piece of equipment    Resistance Training Performed  Yes    VAD Patient?  No    PAD/SET Patient?  No      Pain Assessment   Currently in Pain?  No/denies    Multiple Pain Sites  No       Capillary Blood Glucose: No results found for this or any previous visit (from the past 24 hour(s)).  Exercise Prescription Changes - 09/15/19 0900      Response to Exercise   Blood Pressure (Admit)  100/66    Blood Pressure (Exercise)  130/64    Blood Pressure (Exit)  112/68    Heart Rate (Admit)  84 bpm    Heart Rate (Exercise)  106 bpm    Heart Rate (Exit)  94 bpm    Rating of Perceived Exertion (Exercise)  12    Perceived Dyspnea (Exercise)  0    Symptoms  None    Comments  Pt oriented to exercise equipment    Duration  Progress to 30 minutes of  aerobic without signs/symptoms of physical distress    Intensity  THRR unchanged      Progression   Progression  Continue to progress workloads to maintain intensity without signs/symptoms of physical distress.    Average METs  3.4      Resistance Training   Training  Prescription  Yes    Weight  6lbs    Reps  10-15    Time  10 Minutes      Interval Training   Interval Training  No      Treadmill   MPH  3.5    Grade  1    Minutes  15    METs  3.71      NuStep   Level  3    SPM  95    Minutes  15    METs  3      Home Exercise Plan   Plans to continue exercise at  [facility] (comment)    Frequency  Add 3 additional days to program exercise sessions.    Initial Home Exercises Provided  09/15/19       Social History   Tobacco Use  Smoking Status Former Smoker  . Packs/day: 1.00  . Years: 30.00  . Pack years: 30.00  . Types: Cigarettes  . Quit date: 11/27/2013  . Years since quitting: 5.8  Smokeless Tobacco Never Used    Goals Met:  Exercise tolerated well  Goals Unmet:  Not Applicable  Comments: Pt started cardiac rehab today.  Pt tolerated light exercise without difficulty. VSS, telemetry-SR, asymptomatic.  Medication list reconciled. Pt denies barriers to medicaiton compliance.  PSYCHOSOCIAL ASSESSMENT:  PHQ-0. Pt exhibits positive coping skills, hopeful outlook with supportive family. No psychosocial needs identified at this time, no psychosocial interventions necessary.  Pt oriented to exercise equipment and routine.    Understanding verbalized.    Dr. Fransico Him is Medical Director for Cardiac Rehab at [facility].

## 2019-09-17 ENCOUNTER — Ambulatory Visit (HOSPITAL_COMMUNITY): Payer: BLUE CROSS/BLUE SHIELD

## 2019-09-18 NOTE — Progress Notes (Signed)
Cardiac Individual Treatment Plan  Patient Details  Name: Oluwatoni Melendrez MRN: [id number] Date of Birth: [birth year] Referring Provider:     CARDIAC REHAB PHASE II ORIENTATION from 09/09/2019 in [place]  Referring Provider  Dr. Irish Lack      Initial Encounter Date:    CARDIAC REHAB PHASE II ORIENTATION from 09/09/2019 in [place]  Date  09/09/19      Visit Diagnosis: NSTEMI (non-ST elevated myocardial infarction) ([place])  S/P angioplasty with stent 08/06/19 with DES to large dominant distal RCA   Patient's Home Medications on Admission:  Current Outpatient Medications:  .  acetaminophen (TYLENOL) 325 MG tablet, Take 2 tablets (650 mg total) by mouth every 4 (four) hours as needed for headache or mild pain., Disp: , Rfl:  .  atorvastatin (LIPITOR) 80 MG tablet, Take 1 tablet (80 mg total) by mouth daily at 6 PM., Disp: 30 tablet, Rfl: 6 .  metoprolol succinate (TOPROL XL) 25 MG 24 hr tablet, Take 1 tablet (25 mg total) by mouth daily., Disp: 90 tablet, Rfl: 3 .  nitroGLYCERIN (NITROSTAT) 0.4 MG SL tablet, Place 1 tablet (0.4 mg total) under the tongue every 5 (five) minutes x 3 doses as needed for chest pain. (Patient not taking: Reported on 09/09/2019), Disp: 25 tablet, Rfl: 4 .  ticagrelor (BRILINTA) 90 MG TABS tablet, Take 1 tablet (90 mg total) by mouth 2 (two) times daily., Disp: 60 tablet, Rfl: 11  Past Medical History: Past Medical History:  Diagnosis Date  . History of adenomatous polyp of colon 06/02/2016  . Hyperlipidemia   . Hypertension   . NSTEMI (non-ST elevated myocardial infarction) ([place]) 08/05/2019  . S/P angioplasty with stent 08/06/19 with DES to large dominant distal RCA  08/07/2019  . Sciatica   . Sleep apnea    wears cpap    Tobacco Use: Social History   Tobacco Use  Smoking Status Former Smoker  . Packs/day: 1.00  . Years: 30.00  . Pack years: 30.00  . Types: Cigarettes  . Quit  date: 11/27/2013  . Years since quitting: 5.8  Smokeless Tobacco Never Used    Labs: Recent Review Scientist, physiological    Labs for ITP Cardiac and Pulmonary Rehab Latest Ref Rng & Units 06/14/2010 10/28/2013 01/26/2016 07/15/2019 08/06/2019   Cholestrol 0 - 200 mg/dL 262(H) 203(H) 234(H) 194 -   LDLCALC 0 - 99 mg/dL - - 163(H) 132(H) -   LDLDIRECT mg/dL 188.1 143.2 - - -   HDL >39.00 mg/dL 36.60(L) 29.00(L) 34.20(L) 29.40(L) -   Trlycerides 0.0 - 149.0 mg/dL 188.0(H) 202.0(H) 185.0(H) 160.0(H) -   Hemoglobin A1c 4.8 - 5.6 % - - - 5.7 5.6   TCO2 22 - 32 mmol/L - - - - 25      Capillary Blood Glucose: No results found for: GLUCAP   Exercise Target Goals: Exercise Program Goal: Individual exercise prescription set using results from initial 6 min walk test and THRR while considering  patient's activity barriers and safety.   Exercise Prescription Goal: Initial exercise prescription builds to 30-45 minutes a day of aerobic activity, 2-3 days per week.  Home exercise guidelines will be given to patient during program as part of exercise prescription that the participant will acknowledge.  Activity Barriers & Risk Stratification: Activity Barriers & Cardiac Risk Stratification - 09/09/19 0933      Activity Barriers & Cardiac Risk Stratification   Activity Barriers  None    Cardiac Risk Stratification  High       6 Minute Walk: 6 Minute Walk    Row Name 09/09/19 0932         6 Minute Walk   Phase  Initial     Distance  1824 feet     Walk Time  6 minutes     # of Rest Breaks  0     MPH  3.4     METS  3.6     RPE  11     Perceived Dyspnea   0     VO2 Peak  12.7     Symptoms  No     Resting HR  83 bpm     Resting BP  116/64     Resting Oxygen Saturation   96 %     Exercise Oxygen Saturation  during 6 min walk  97 %     Max Ex. HR  123 bpm     Max Ex. BP  134/68     2 Minute Post BP  116/66        Oxygen Initial Assessment:   Oxygen Re-Evaluation:   Oxygen Discharge  (Final Oxygen Re-Evaluation):   Initial Exercise Prescription: Initial Exercise Prescription - 09/09/19 0900      Date of Initial Exercise RX and Referring Provider   Date  09/09/19    Referring Provider  Dr. Irish Lack    Expected Discharge Date  11/07/19      Treadmill   MPH  3    Grade  1    Minutes  15      NuStep   Level  3    SPM  85    Minutes  15    METs  3.2      Prescription Details   Frequency (times per week)  3    Duration  Progress to 30 minutes of continuous aerobic without signs/symptoms of physical distress      Intensity   THRR 40-80% of Max Heartrate  66-133    Ratings of Perceived Exertion  11-13      Progression   Progression  Continue to progress workloads to maintain intensity without signs/symptoms of physical distress.      Resistance Training   Training Prescription  Yes    Weight  5 lbs.     Reps  10-15       Perform Capillary Blood Glucose checks as needed.  Exercise Prescription Changes: Exercise Prescription Changes    Row Name 09/15/19 0900             Response to Exercise   Blood Pressure (Admit)  100/66       Blood Pressure (Exercise)  130/64       Blood Pressure (Exit)  112/68       Heart Rate (Admit)  84 bpm       Heart Rate (Exercise)  106 bpm       Heart Rate (Exit)  94 bpm       Rating of Perceived Exertion (Exercise)  12       Perceived Dyspnea (Exercise)  0       Symptoms  None       Comments  Pt oriented to exercise equipment       Duration  Progress to 30 minutes of  aerobic without signs/symptoms of physical distress       Intensity  THRR unchanged         Progression   Progression  Continue to progress workloads to maintain intensity without signs/symptoms of physical distress.       Average METs  3.4         Resistance Training   Training Prescription  Yes       Weight  6lbs       Reps  10-15       Time  10 Minutes         Interval Training   Interval Training  No         Treadmill   MPH  3.5        Grade  1       Minutes  15       METs  3.71         NuStep   Level  3       SPM  95       Minutes  15       METs  3         Home Exercise Plan   Plans to continue exercise at  [facility] (comment)       Frequency  Add 3 additional days to program exercise sessions.       Initial Home Exercises Provided  09/15/19          Exercise Comments: Exercise Comments    Row Name 09/15/19 0959           Exercise Comments  Pt's first day of Cardiac Rehab. Pt oriented to exercise equipment. Reviewed exercise plans for departmental closure. Pt currently exercises at local gym 5-6 days a week for 45-90 minutes. Will follow up with pt next session to make sure answers/concerns are addressed regarding better hearts app.          Exercise Goals and Review: Exercise Goals    Row Name 09/09/19 0935             Exercise Goals   Increase Physical Activity  Yes       Intervention  Provide advice, education, support and counseling about physical activity/exercise needs.;Develop an individualized exercise prescription for aerobic and resistive training based on initial evaluation findings, risk stratification, comorbidities and participant's personal goals.       Expected Outcomes  Short Term: Attend rehab on a regular basis to increase amount of physical activity.;Long Term: Add in home exercise to make exercise part of routine and to increase amount of physical activity.;Long Term: Exercising regularly at least 3-5 days a week.       Increase Strength and Stamina  Yes       Intervention  Provide advice, education, support and counseling about physical activity/exercise needs.;Develop an individualized exercise prescription for aerobic and resistive training based on initial evaluation findings, risk stratification, comorbidities and participant's personal goals.       Expected Outcomes  Short Term: Increase workloads from initial exercise prescription for resistance, speed, and  METs.;Short Term: Perform resistance training exercises routinely during rehab and add in resistance training at home;Long Term: Improve cardiorespiratory fitness, muscular endurance and strength as measured by increased METs and functional capacity (6MWT)       Able to understand and use rate of perceived exertion (RPE) scale  Yes       Intervention  Provide education and explanation on how to use RPE scale       Expected Outcomes  Short Term: Able to use RPE daily in rehab to express subjective intensity level;Long Term:  Able to use RPE to  guide intensity level when exercising independently       Knowledge and understanding of Target Heart Rate Range (THRR)  Yes       Intervention  Provide education and explanation of THRR including how the numbers were predicted and where they are located for reference       Expected Outcomes  Short Term: Able to state/look up THRR;Long Term: Able to use THRR to govern intensity when exercising independently;Short Term: Able to use daily as guideline for intensity in rehab       Able to check pulse independently  Yes       Intervention  Provide education and demonstration on how to check pulse in carotid and radial arteries.;Review the importance of being able to check your own pulse for safety during independent exercise       Expected Outcomes  Short Term: Able to explain why pulse checking is important during independent exercise;Long Term: Able to check pulse independently and accurately       Understanding of Exercise Prescription  Yes       Intervention  Provide education, explanation, and written materials on patient's individual exercise prescription       Expected Outcomes  Short Term: Able to explain program exercise prescription;Long Term: Able to explain home exercise prescription to exercise independently          Exercise Goals Re-Evaluation : Exercise Goals Re-Evaluation    Row Name 09/15/19 1006             Exercise Goal Re-Evaluation    Exercise Goals Review  Increase Physical Activity;Able to understand and use rate of perceived exertion (RPE) scale       Comments  Pt oriented to exercise equipment. Pt responded well to exercise prescription.       Expected Outcomes  Pt will continue to exercise most days of the week for 60-90 minutes. Will discuss with pt regarding making workouts more focused to help cut down on time spent at gym.          Discharge Exercise Prescription (Final Exercise Prescription Changes): Exercise Prescription Changes - 09/15/19 0900      Response to Exercise   Blood Pressure (Admit)  100/66    Blood Pressure (Exercise)  130/64    Blood Pressure (Exit)  112/68    Heart Rate (Admit)  84 bpm    Heart Rate (Exercise)  106 bpm    Heart Rate (Exit)  94 bpm    Rating of Perceived Exertion (Exercise)  12    Perceived Dyspnea (Exercise)  0    Symptoms  None    Comments  Pt oriented to exercise equipment    Duration  Progress to 30 minutes of  aerobic without signs/symptoms of physical distress    Intensity  THRR unchanged      Progression   Progression  Continue to progress workloads to maintain intensity without signs/symptoms of physical distress.    Average METs  3.4      Resistance Training   Training Prescription  Yes    Weight  6lbs    Reps  10-15    Time  10 Minutes      Interval Training   Interval Training  No      Treadmill   MPH  3.5    Grade  1    Minutes  15    METs  3.71      NuStep   Level  3    SPM  95    Minutes  15    METs  3      Home Exercise Plan   Plans to continue exercise at  [facility] (comment)    Frequency  Add 3 additional days to program exercise sessions.    Initial Home Exercises Provided  09/15/19       Nutrition:  Target Goals: Understanding of nutrition guidelines, daily intake of sodium 1500mg , cholesterol 200mg , calories 30% from fat and 7% or less from saturated fats, daily to have 5 or more servings of fruits and  vegetables.  Biometrics: Pre Biometrics - 09/09/19 0936      Pre Biometrics   Height  5' 10.25" (1.784 m)    Weight  (!) 155.7 kg    Waist Circumference  51.5 inches    Hip Circumference  58.5 inches    Waist to Hip Ratio  0.88 %    BMI (Calculated)  48.92    Triceps Skinfold  28 mm    % Body Fat  42.8 %    Grip Strength  38 kg    Flexibility  9.5 in    Single Leg Stand  30 seconds        Nutrition Therapy Plan and Nutrition Goals:   Nutrition Assessments:   Nutrition Goals Re-Evaluation:   Nutrition Goals Re-Evaluation:   Nutrition Goals Discharge (Final Nutrition Goals Re-Evaluation):   Psychosocial: Target Goals: Acknowledge presence or absence of significant depression and/or stress, maximize coping skills, provide positive support system. Participant is able to verbalize types and ability to use techniques and skills needed for reducing stress and depression.  Initial Review & Psychosocial Screening: Initial Psych Review & Screening - 09/09/19 0935      Initial Review   Current issues with  None Identified      Family Dynamics   Good Support System?  Yes    Comments  Mr. Sia denies barriers to participation in CR and self health management. He has a positive attitude and outlook. He acknolodges a strong support system including family, friends, and health care team. No psychosocial interventions needed at this time.      Barriers   Psychosocial barriers to participate in program  There are no identifiable barriers or psychosocial needs.      Screening Interventions   Interventions  Encouraged to exercise       Quality of Life Scores: Quality of Life - 09/09/19 0938      Quality of Life   Select  Quality of Life      Quality of Life Scores   Health/Function Pre  19.2 %    Socioeconomic Pre  22.14 %    Psych/Spiritual Pre  25.14 %    Family Pre  21.63 %    GLOBAL Pre  21.38 %      Scores of 19 and below usually indicate a poorer quality of  life in these areas.  A difference of  2-3 points is a clinically meaningful difference.  A difference of 2-3 points in the total score of the Quality of Life Index has been associated with significant improvement in overall quality of life, self-image, physical symptoms, and general health in studies assessing change in quality of life.  PHQ-9: Recent Review Flowsheet Data    Depression screen [facility] 2/9 09/09/2019 05/30/2016 04/15/2015 07/20/2014 06/30/2014   Decreased Interest 0 0 0 0 0   Down, Depressed, Hopeless 0 0 0 0 0   PHQ - 2 Score 0 0  0 0 0     Interpretation of Total Score  Total Score Depression Severity:  1-4 = Minimal depression, 5-9 = Mild depression, 10-14 = Moderate depression, 15-19 = Moderately severe depression, 20-27 = Severe depression   Psychosocial Evaluation and Intervention: Psychosocial Evaluation - 09/15/19 0832      Psychosocial Evaluation & Interventions   Interventions  Encouraged to exercise with the program and follow exercise prescription    Comments  No psychosocial interventions necessary.  He enjoys yard working, fishing, camping, and Investment banker, operational.    Expected Outcomes  Butch will maintain a positive outlook with good coping skills.    Continue Psychosocial Services   No Follow up required       Psychosocial Re-Evaluation:   Psychosocial Discharge (Final Psychosocial Re-Evaluation):   Vocational Rehabilitation: Provide vocational rehab assistance to qualifying candidates.   Vocational Rehab Evaluation & Intervention:   Education: Education Goals: Education classes will be provided on a weekly basis, covering required topics. Participant will state understanding/return demonstration of topics presented.  Learning Barriers/Preferences: Learning Barriers/Preferences - 09/09/19 0939      Learning Barriers/Preferences   Learning Barriers  None    Learning Preferences  Written Material       Education Topics: Count Your Pulse:  -Group  instruction provided by verbal instruction, demonstration, patient participation and written materials to support subject.  Instructors address importance of being able to find your pulse and how to count your pulse when at home without a heart monitor.  Patients get hands on experience counting their pulse with staff help and individually.   Heart Attack, Angina, and Risk Factor Modification:  -Group instruction provided by verbal instruction, video, and written materials to support subject.  Instructors address signs and symptoms of angina and heart attacks.    Also discuss risk factors for heart disease and how to make changes to improve heart health risk factors.   Functional Fitness:  -Group instruction provided by verbal instruction, demonstration, patient participation, and written materials to support subject.  Instructors address safety measures for doing things around the house.  Discuss how to get up and down off the floor, how to pick things up properly, how to safely get out of a chair without assistance, and balance training.   Meditation and Mindfulness:  -Group instruction provided by verbal instruction, patient participation, and written materials to support subject.  Instructor addresses importance of mindfulness and meditation practice to help reduce stress and improve awareness.  Instructor also leads participants through a meditation exercise.    Stretching for Flexibility and Mobility:  -Group instruction provided by verbal instruction, patient participation, and written materials to support subject.  Instructors lead participants through series of stretches that are designed to increase flexibility thus improving mobility.  These stretches are additional exercise for major muscle groups that are typically performed during regular warm up and cool down.   Hands Only CPR:  -Group verbal, video, and participation provides a basic overview of AHA guidelines for community CPR.  Role-play of emergencies allow participants the opportunity to practice calling for help and chest compression technique with discussion of AED use.   Hypertension: -Group verbal and written instruction that provides a basic overview of hypertension including the most recent diagnostic guidelines, risk factor reduction with self-care instructions and medication management.    Nutrition I class: Heart Healthy Eating:  -Group instruction provided by PowerPoint slides, verbal discussion, and written materials to support subject matter. The instructor gives an explanation and review of  the Therapeutic Lifestyle Changes diet recommendations, which includes a discussion on lipid goals, dietary fat, sodium, fiber, plant stanol/sterol esters, sugar, and the components of a well-balanced, healthy diet.   Nutrition II class: Lifestyle Skills:  -Group instruction provided by PowerPoint slides, verbal discussion, and written materials to support subject matter. The instructor gives an explanation and review of label reading, grocery shopping for heart health, heart healthy recipe modifications, and ways to make healthier choices when eating out.   Diabetes Question & Answer:  -Group instruction provided by PowerPoint slides, verbal discussion, and written materials to support subject matter. The instructor gives an explanation and review of diabetes co-morbidities, pre- and post-prandial blood glucose goals, pre-exercise blood glucose goals, signs, symptoms, and treatment of hypoglycemia and hyperglycemia, and foot care basics.   Diabetes Blitz:  -Group instruction provided by PowerPoint slides, verbal discussion, and written materials to support subject matter. The instructor gives an explanation and review of the physiology behind type 1 and type 2 diabetes, diabetes medications and rational behind using different medications, pre- and post-prandial blood glucose recommendations and Hemoglobin A1c goals,  diabetes diet, and exercise including blood glucose guidelines for exercising safely.    Portion Distortion:  -Group instruction provided by PowerPoint slides, verbal discussion, written materials, and food models to support subject matter. The instructor gives an explanation of serving size versus portion size, changes in portions sizes over the last 20 years, and what consists of a serving from each food group.   Stress Management:  -Group instruction provided by verbal instruction, video, and written materials to support subject matter.  Instructors review role of stress in heart disease and how to cope with stress positively.     Exercising on Your Own:  -Group instruction provided by verbal instruction, power point, and written materials to support subject.  Instructors discuss benefits of exercise, components of exercise, frequency and intensity of exercise, and end points for exercise.  Also discuss use of nitroglycerin and activating EMS.  Review options of places to exercise outside of rehab.  Review guidelines for sex with heart disease.   Cardiac Drugs I:  -Group instruction provided by verbal instruction and written materials to support subject.  Instructor reviews cardiac drug classes: antiplatelets, anticoagulants, beta blockers, and statins.  Instructor discusses reasons, side effects, and lifestyle considerations for each drug class.   Cardiac Drugs II:  -Group instruction provided by verbal instruction and written materials to support subject.  Instructor reviews cardiac drug classes: angiotensin converting enzyme inhibitors (ACE-I), angiotensin II receptor blockers (ARBs), nitrates, and calcium channel blockers.  Instructor discusses reasons, side effects, and lifestyle considerations for each drug class.   Anatomy and Physiology of the Circulatory System:  Group verbal and written instruction and models provide basic cardiac anatomy and physiology, with the coronary  electrical and arterial systems. Review of: AMI, Angina, Valve disease, Heart Failure, Peripheral Artery Disease, Cardiac Arrhythmia, Pacemakers, and the ICD.   Other Education:  -Group or individual verbal, written, or video instructions that support the educational goals of the cardiac rehab program.   Holiday Eating Survival Tips:  -Group instruction provided by PowerPoint slides, verbal discussion, and written materials to support subject matter. The instructor gives patients tips, tricks, and techniques to help them not only survive but enjoy the holidays despite the onslaught of food that accompanies the holidays.   Knowledge Questionnaire Score: Knowledge Questionnaire Score - 09/09/19 0940      Knowledge Questionnaire Score   Pre Score  24/24  Core Components/Risk Factors/Patient Goals at Admission: Personal Goals and Risk Factors at Admission - 09/09/19 0939      Core Components/Risk Factors/Patient Goals on Admission    Weight Management  Yes;Obesity;Weight Maintenance;Weight Loss    Intervention  Weight Management: Develop a combined nutrition and exercise program designed to reach desired caloric intake, while maintaining appropriate intake of nutrient and fiber, sodium and fats, and appropriate energy expenditure required for the weight goal.;Weight Management: Provide education and appropriate resources to help participant work on and attain dietary goals.;Weight Management/Obesity: Establish reasonable short term and long term weight goals.;Obesity: Provide education and appropriate resources to help participant work on and attain dietary goals.    Admit Weight  343 lb 4.1 oz (155.7 kg)    Goal Weight: Short Term  293 lb (132.9 kg)    Goal Weight: Long Term  200 lb (90.7 kg)    Expected Outcomes  Short Term: Continue to assess and modify interventions until short term weight is achieved;Long Term: Adherence to nutrition and physical activity/exercise program aimed  toward attainment of established weight goal;Weight Maintenance: Understanding of the daily nutrition guidelines, which includes 25-35% calories from fat, 7% or less cal from saturated fats, less than 200mg  cholesterol, less than 1.5gm of sodium, & 5 or more servings of fruits and vegetables daily;Weight Loss: Understanding of general recommendations for a balanced deficit meal plan, which promotes 1-2 lb weight loss per week and includes a negative energy balance of [id number] kcal/d;Understanding recommendations for meals to include 15-35% energy as protein, 25-35% energy from fat, 35-60% energy from carbohydrates, less than 200mg  of dietary cholesterol, 20-35 gm of total fiber daily;Understanding of distribution of calorie intake throughout the day with the consumption of 4-5 meals/snacks    Hypertension  Yes    Intervention  Provide education on lifestyle modifcations including regular physical activity/exercise, weight management, moderate sodium restriction and increased consumption of fresh fruit, vegetables, and low fat dairy, alcohol moderation, and smoking cessation.;Monitor prescription use compliance.    Expected Outcomes  Short Term: Continued assessment and intervention until BP is < 140/29mm HG in hypertensive participants. < 130/21mm HG in hypertensive participants with diabetes, heart failure or chronic kidney disease.;Long Term: Maintenance of blood pressure at goal levels.    Lipids  Yes    Intervention  Provide education and support for participant on nutrition & aerobic/resistive exercise along with prescribed medications to achieve LDL 70mg , HDL >40mg .    Expected Outcomes  Short Term: Participant states understanding of desired cholesterol values and is compliant with medications prescribed. Participant is following exercise prescription and nutrition guidelines.;Long Term: Cholesterol controlled with medications as prescribed, with individualized exercise RX and with personalized  nutrition plan. Value goals: LDL < 70mg , HDL > 40 mg.       Core Components/Risk Factors/Patient Goals Review:  Goals and Risk Factor Review    Row Name 09/15/19 0835             Core Components/Risk Factors/Patient Goals Review   Personal Goals Review  Weight Management/Obesity;Lipids;Hypertension       Review  Pt with multiple CAD RFs eager to articipate in CR exercise.  [name] is currently working out at a [employer].  Butch would like to continue to lose weight and change his lifestyle to healthy.       Expected Outcomes  Butch will continue to participate in CR exercise, nutrition, and lifestyle modification opportunities.          Core Components/Risk Factors/Patient Goals at  Discharge (Final Review):  Goals and Risk Factor Review - 09/15/19 0835      Core Components/Risk Factors/Patient Goals Review   Personal Goals Review  Weight Management/Obesity;Lipids;Hypertension    Review  Pt with multiple CAD RFs eager to articipate in CR exercise.  [name] is currently working out at a [employer].  Butch would like to continue to lose weight and change his lifestyle to healthy.    Expected Outcomes  Butch will continue to participate in CR exercise, nutrition, and lifestyle modification opportunities.       ITP Comments: ITP Comments    Row Name 09/09/19 0810 09/15/19 0826         ITP Comments  Dr. Fransico Him, Medical Director [facility] [place] started exercise today and tolerated it well.         Comments: See ITP Comments.

## 2019-09-22 ENCOUNTER — Other Ambulatory Visit: Payer: BLUE CROSS/BLUE SHIELD | Admitting: *Deleted

## 2019-09-22 ENCOUNTER — Other Ambulatory Visit: Payer: Self-pay

## 2019-09-22 ENCOUNTER — Ambulatory Visit (HOSPITAL_COMMUNITY): Payer: BLUE CROSS/BLUE SHIELD

## 2019-09-22 DIAGNOSIS — I10 Essential (primary) hypertension: Secondary | ICD-10-CM

## 2019-09-22 DIAGNOSIS — E782 Mixed hyperlipidemia: Secondary | ICD-10-CM | POA: Diagnosis not present

## 2019-09-22 DIAGNOSIS — I214 Non-ST elevation (NSTEMI) myocardial infarction: Secondary | ICD-10-CM

## 2019-09-22 LAB — HEPATIC FUNCTION PANEL
ALT: 29 IU/L (ref 0–44)
AST: 24 IU/L (ref 0–40)
Albumin: 4.6 g/dL (ref 3.8–4.9)
Alkaline Phosphatase: 97 IU/L (ref 39–117)
Bilirubin Total: 0.9 mg/dL (ref 0.0–1.2)
Bilirubin, Direct: 0.28 mg/dL (ref 0.00–0.40)
Total Protein: 6.8 g/dL (ref 6.0–8.5)

## 2019-09-22 LAB — LIPID PANEL
Chol/HDL Ratio: 2.7 ratio (ref 0.0–5.0)
Cholesterol, Total: 81 mg/dL — ABNORMAL LOW (ref 100–199)
HDL: 30 mg/dL — ABNORMAL LOW (ref >39)
LDL Chol Calc (NIH): 33 mg/dL (ref 0–99)
Triglycerides: 86 mg/dL (ref 0–149)
VLDL Cholesterol Cal: 18 mg/dL (ref 5–40)

## 2019-09-23 ENCOUNTER — Other Ambulatory Visit: Payer: Self-pay | Admitting: Pharmacist

## 2019-09-23 DIAGNOSIS — Z7252 High risk homosexual behavior: Secondary | ICD-10-CM

## 2019-09-24 ENCOUNTER — Ambulatory Visit (HOSPITAL_COMMUNITY): Payer: BLUE CROSS/BLUE SHIELD

## 2019-09-24 ENCOUNTER — Ambulatory Visit (INDEPENDENT_AMBULATORY_CARE_PROVIDER_SITE_OTHER): Payer: BC Managed Care – PPO | Admitting: Pharmacist

## 2019-09-24 ENCOUNTER — Other Ambulatory Visit: Payer: Self-pay

## 2019-09-24 DIAGNOSIS — Z79899 Other long term (current) drug therapy: Secondary | ICD-10-CM

## 2019-09-24 DIAGNOSIS — Z7252 High risk homosexual behavior: Secondary | ICD-10-CM | POA: Diagnosis not present

## 2019-09-25 LAB — HIV ANTIBODY (ROUTINE TESTING W REFLEX): HIV 1&2 Ab, 4th Generation: NONREACTIVE

## 2019-09-26 ENCOUNTER — Ambulatory Visit (HOSPITAL_COMMUNITY): Payer: BLUE CROSS/BLUE SHIELD

## 2019-09-26 ENCOUNTER — Other Ambulatory Visit: Payer: Self-pay

## 2019-09-26 ENCOUNTER — Telehealth (HOSPITAL_COMMUNITY): Payer: Self-pay

## 2019-09-26 ENCOUNTER — Encounter: Payer: Self-pay | Admitting: Pharmacist

## 2019-09-26 ENCOUNTER — Encounter (HOSPITAL_COMMUNITY)
Admission: RE | Admit: 2019-09-26 | Discharge: 2019-09-26 | Disposition: A | Discharge status: Home / Self Care | Payer: Self-pay | Source: Ambulatory Visit | Attending: Interventional Cardiology | Admitting: Interventional Cardiology

## 2019-09-26 ENCOUNTER — Telehealth: Payer: Self-pay | Admitting: Pharmacy Technician

## 2019-09-26 ENCOUNTER — Other Ambulatory Visit: Payer: Self-pay | Admitting: Pharmacist

## 2019-09-26 DIAGNOSIS — Z7252 High risk homosexual behavior: Secondary | ICD-10-CM

## 2019-09-26 DIAGNOSIS — Z79899 Other long term (current) drug therapy: Secondary | ICD-10-CM

## 2019-09-26 MED ORDER — DESCOVY 200-25 MG PO TABS: 1.0000 | ORAL_TABLET | Freq: Every day | ORAL | 2 refills | Status: AC

## 2019-09-26 NOTE — Telephone Encounter (Signed)
[facility] Patient Advocate Encounter   Was successful in obtaining a [employer] copay card for decovy.  This copay card will make the patients copay $0.  I have spoken with the patient.    The billing information is as follows and has been shared with Basye. He will pick up there until his insurance deems otherwise.   RxBin: [id number] PCN: ACCESS Member ID: [id number] Group ID: [id number]  Bartholomew Crews, CPhT Specialty Pharmacy Patient [facility] for Infectious Disease Phone: [phone] Fax: [phone] 09/26/2019 3:32 PM

## 2019-09-26 NOTE — Progress Notes (Signed)
Virtual Cardiac Rehab Note: Incoming call from Mr. Jeremy Guerrero in response to left VM earlier today. Mr. Jeremy Guerrero states he is doing well. 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 He continues to do cardiovascular exercise daily at a community gym. He utilizes the elliptical and treadmill. He also continues his weight loss journey by adhering to a heart healthy, low carb diet. He states he is checking his BP and pulse twice daily and with exercise. He is encouraged to use the app to log exercise and vitals 2-3 times a week so Virtual CR staff can provide feedback. Patient agreeable.  Plan: Will follow up with patient within 1 week to assess for needs or concerns and to provide support.  Haydon Kalmar E. Laray Anger, BSN

## 2019-09-26 NOTE — Telephone Encounter (Signed)
Cardiac Rehab Note: Unsuccessful telephone encounter to Jeremy Guerrero to follow up on cardiac rehab plan of care, home exercise, and barriers to utilizing the [facility] Virtual CR app. HIPAA compliant VM message left requesting call back to [phone]  Plan: Will await call back. Follow up in 1 week  Rachyl Wuebker E. Laray Anger, BSN

## 2019-09-26 NOTE — Progress Notes (Signed)
Date:  09/26/2019   HPI: Jeremy Guerrero is a 54 y.o. male who presents to the [facility] for 3 month PrEP follow-up.  Insured   [x]    Uninsured  []    Patient Active Problem List   Diagnosis Date Noted  . S/P angioplasty with stent 08/06/19 with DES to large dominant distal RCA  08/07/2019  . NSTEMI (non-ST elevated myocardial infarction) ([place]) 08/05/2019  . CAD (coronary artery disease), native coronary artery 08/05/2019  . History of adenomatous polyp of colon 06/02/2016  . High risk sexual behavior 06/02/2016  . Hyperglycemia 02/09/2016  . HTN (hypertension) 01/12/2016  . Morbid obesity ([place]) 01/12/2016  . Knee pain, bilateral 03/24/2014  . OSA (obstructive sleep apnea) 10/30/2013  . Eczema 10/28/2013  . BACK PAIN 06/22/2010  . Hyperlipidemia with target LDL less than 70 06/14/2010  . SCIATICA, RIGHT 07/23/2007  . LIBIDO, DECREASED 04/26/2007    Patient's Medications  New Prescriptions   EMTRICITABINE-TENOFOVIR AF (DESCOVY) 200-25 MG TABLET    Take 1 tablet by mouth daily.  Previous Medications   ACETAMINOPHEN (TYLENOL) 325 MG TABLET    Take 2 tablets (650 mg total) by mouth every 4 (four) hours as needed for headache or mild pain.   ATORVASTATIN (LIPITOR) 80 MG TABLET    Take 1 tablet (80 mg total) by mouth daily at 6 PM.   METOPROLOL SUCCINATE (TOPROL XL) 25 MG 24 HR TABLET    Take 1 tablet (25 mg total) by mouth daily.   NITROGLYCERIN (NITROSTAT) 0.4 MG SL TABLET    Place 1 tablet (0.4 mg total) under the tongue every 5 (five) minutes x 3 doses as needed for chest pain.   TICAGRELOR (BRILINTA) 90 MG TABS TABLET    Take 1 tablet (90 mg total) by mouth 2 (two) times daily.  Modified Medications   No medications on file  Discontinued Medications   No medications on file    Allergies: No Known Allergies  Past Medical History: Past Medical History:  Diagnosis Date  . History of adenomatous polyp of colon 06/02/2016  . Hyperlipidemia   . Hypertension   .  NSTEMI (non-ST elevated myocardial infarction) ([place]) 08/05/2019  . S/P angioplasty with stent 08/06/19 with DES to large dominant distal RCA  08/07/2019  . Sciatica   . Sleep apnea    wears cpap    Social History: Social History   Socioeconomic History  . Marital status: Soil scientist    Spouse name: Not on file  . Number of children: Not on file  . Years of education: Not on file  . Highest education level: Not on file  Occupational History  . Occupation: Best boy: [employer]  Tobacco Use  . Smoking status: Former Smoker    Packs/day: 1.00    Years: 30.00    Pack years: 30.00    Types: Cigarettes    Quit date: 11/27/2013    Years since quitting: 5.8  . Smokeless tobacco: Never Used  Substance and Sexual Activity  . Alcohol use: Yes    Alcohol/week: 0.0 standard drinks    Comment: 6 beers a month  . Drug use: No  . Sexual activity: Yes    Partners: Male    Birth control/protection: None  Other Topics Concern  . Not on file  Social History Narrative  . Not on file   Social Determinants of Health   Financial Resource Strain: Low Risk   . Difficulty of Paying Living Expenses:  Not hard at all  Food Insecurity: No Food Insecurity  . Worried About Charity fundraiser in the Last Year: Never true  . Ran Out of Food in the Last Year: Never true  Transportation Needs: No Transportation Needs  . Lack of Transportation (Medical): No  . Lack of Transportation (Non-Medical): No  Physical Activity: Sufficiently Active  . Days of Exercise per Week: 6 days  . Minutes of Exercise per Session: 90 min  Stress: No Stress Concern Present  . Feeling of Stress : Only a little  Social Connections: Unknown  . Frequency of Communication with Friends and Family: More than three times a week  . Frequency of Social Gatherings with Friends and Family: Not on file  . Attends Religious Services: Not on file  . Active Member of Clubs or Organizations: Yes  .  Attends Archivist Meetings: More than 4 times per year  . Marital Status: Living with partner    CHL HIV PREP FLOWSHEET RESULTS 05/21/2019 02/21/2019 10/31/2018 07/31/2018 04/23/2018 01/14/2018 10/10/2017  Insurance Status Insured Insured Insured Insured Insured Insured Insured  How did you hear? - - - - - Doctor referral -  Gender at birth Male Male Male Male Male Male Male  Gender identity cis-Male cis-Male cis-Male cis-Male cis-Male cis-Male -  Risk for HIV - - Condomless vaginal or anal intercourse - - Condomless vaginal or anal intercourse;Hx of STI Condomless vaginal or anal intercourse;Hx of STI  Sex Partners Men only Men only Men only - Men only Men only Men only  # sex partners past 3-6 mos 4-6 - 1-3 - 1-3 1-3 1-3  Sex activity preferences Insertive - Insertive - Insertive Insertive Insertive  Condom use No - No - No No No  Partners genders and ages - - - - - M 60-49 M 51+  Treated for STI? No - No - - No No  HIV symptoms? N/A - N/A N/A N/A N/A N/A  PrEP Eligibility Substantial risk for HIV - Substantial risk for HIV HIV negative;CrCl >60 ml/min Substantial risk for HIV;HIV negative HIV negative;Substantial risk for HIV -  Paper work received? No - - - - No No    Labs:  SCr: Lab Results  Component Value Date   CREATININE 0.73 08/07/2019   CREATININE 0.70 08/06/2019   CREATININE 0.43 (L) 08/06/2019   CREATININE 0.82 08/06/2019   CREATININE 0.77 08/05/2019   HIV Lab Results  Component Value Date   HIV NON-REACTIVE 09/24/2019   HIV NON-REACTIVE 05/21/2019   HIV NON-REACTIVE 02/19/2019   HIV NON-REACTIVE 10/31/2018   HIV NON-REACTIVE 07/31/2018   Hepatitis B Lab Results  Component Value Date   HEPBSAB NON-REACTIVE 01/14/2018   HEPBSAG NEGATIVE 05/19/2014   Hepatitis C No results found for: HEPCAB, HCVRNAPCRQN Hepatitis A Lab Results  Component Value Date   HAV NON-REACTIVE 01/14/2018   RPR and STI Lab Results  Component Value Date   LABRPR NON-REACTIVE  05/21/2019   LABRPR NON-REACTIVE 10/31/2018   LABRPR NON-REACTIVE 10/10/2017   LABRPR NON REAC 03/26/2017   LABRPR NON REAC 11/27/2016    STI Results GC CT  05/21/2019 Negative Negative  10/31/2018 Negative Negative  01/14/2018 Negative Negative  10/10/2017 Negative Negative  03/26/2017 Negative Negative  03/26/2017 Negative Negative  03/26/2017 Negative Negative  12/12/2016 Negative Negative  11/27/2016 Negative Negative  05/30/2016 *Negative *Negative  05/16/2016 Negative Negative  03/24/2015 Negative Negative    Assessment: Jeremy Guerrero is here today to follow up for PrEP. He has been doing  well with no complaints. He was hospitalized back in November for a heart attack and missed 7 days of his Descovy. His insurance plan changed and he is able to fill at [facility] now.  He has had no new partners and is doing well. Will check labs and see him back in 3 months.  Plan: - HIV antibody today - Descovy x 3 months if HIV negative - F/u with me again 4/6 at 230pm  Carter Kaman L. Tabias Swayze, PharmD, BCIDP, AAHIVP, [place] for Infectious Disease 09/26/2019, 2:44 PM

## 2019-09-29 ENCOUNTER — Ambulatory Visit (HOSPITAL_COMMUNITY): Payer: BLUE CROSS/BLUE SHIELD

## 2019-09-29 MED FILL — DESCOVY 200-25 MG TABS: 200-25 | 30 days supply | Qty: 30 | Fill #0

## 2019-09-29 NOTE — Telephone Encounter (Signed)
[facility] Patient Advocate Encounter  Patient left a voicemail over the weekend, that has been responded to and coordinated with the patient to have all three of his medications filled at [facility]. Per insurance, the patient will have to wait until 09/30/2019 to have them processed. He is aware and will pick up Wednesday morning.

## 2019-09-30 MED FILL — BRILINTA 90 MG TABLET: 90 | 30 days supply | Qty: 60 | Fill #1

## 2019-09-30 MED FILL — ATORVASTATIN 80 MG TABLET: 80 | 30 days supply | Qty: 30 | Fill #1

## 2019-10-01 ENCOUNTER — Other Ambulatory Visit: Payer: Self-pay

## 2019-10-01 ENCOUNTER — Ambulatory Visit (HOSPITAL_COMMUNITY): Payer: BLUE CROSS/BLUE SHIELD

## 2019-10-01 ENCOUNTER — Encounter (HOSPITAL_COMMUNITY)
Admission: RE | Admit: 2019-10-01 | Discharge: 2019-10-01 | Disposition: A | Discharge status: Home / Self Care | Payer: Self-pay | Source: Ambulatory Visit | Attending: Interventional Cardiology | Admitting: Interventional Cardiology

## 2019-10-01 NOTE — Progress Notes (Signed)
Virtual Cardiac Rehab Note: Successful telephone encounter to Mr. Jeremy Guerrero to follow up on cardiac symptoms (chest discomfort) Mr. Jeremy Guerrero logged in the virtual app yesterday pm. Per Mr. Jeremy Guerrero he had mid-sternal chest "pain" 2/10 most of the day. He states he had a very stressful day at work and feels his BP was elevated. He is unable to recall his BP but states it was "higher than is should be". It is not recorded in the app. Jeremy Guerrero states the pain was at rest and with exertion and did not change. He proceeded to exercise at the gym doing 60 min of cardio with moderate intensity. He feels the pain did subside briefly with exercise however returned to its original level of 2/10. The pain continued until he went to bed and he awoke pain free this morning. He did not take nitro as he was unsure when and how to use.  Intervention: this CR RN discuss with Mr. Jeremy Guerrero the importance of following a "chest pain protocol" as previously educated during hospitalization. Mr. Jeremy Guerrero is provided instructions of how and when to use his sublingual nitro. He is instructed to call his cardiologist if he has to use his nitro and 911 if no relief after 2 doses. Mr Jeremy Guerrero understanding.   Plan: Will forward note to Dr. Irish Lack. Follow up with patient via app/telephone in 1 week  Jeremy Guerrero E. Laray Anger, BSN

## 2019-10-03 ENCOUNTER — Other Ambulatory Visit: Payer: Self-pay

## 2019-10-03 ENCOUNTER — Encounter: Payer: Self-pay | Admitting: Pulmonary Disease

## 2019-10-03 ENCOUNTER — Ambulatory Visit (HOSPITAL_COMMUNITY): Payer: BLUE CROSS/BLUE SHIELD

## 2019-10-03 ENCOUNTER — Ambulatory Visit (INDEPENDENT_AMBULATORY_CARE_PROVIDER_SITE_OTHER): Payer: BC Managed Care – PPO | Admitting: Pulmonary Disease

## 2019-10-03 DIAGNOSIS — G4733 Obstructive sleep apnea (adult) (pediatric): Secondary | ICD-10-CM | POA: Diagnosis not present

## 2019-10-03 NOTE — Assessment & Plan Note (Signed)
Since his weight has fluctuated and is possible that has pressure requirements have changed Please bring your CPAP machine next week so that we can get a report from it and decide if pressure needs to be adjusted. This will allow [place] also to objectively check compliance Trial of AirFit F30 full facemask -we will obtain this online rather than from DME. He will contact us if he needs prescriptions  Weight loss encouraged, compliance with goal of at least 4-6 hrs every night is the expectation. Advised against medications with sedative side effects Cautioned against driving when sleepy - understanding that sleepiness will vary on a day to day basis

## 2019-10-03 NOTE — Patient Instructions (Signed)
  Please bring your CPAP machine next week so that we can get a report from it and decide if pressure needs to be adjusted. Trial of AirFit F30 full facemask

## 2019-10-03 NOTE — Progress Notes (Signed)
Subjective:    Patient ID: Jeremy Guerrero, male    DOB: 10/26/1965, 54 y.o.   MRN: [id number]  HPI  Chief Complaint  Patient presents with  . Consult    Patient is here for sleep apnea. Last OV and sleep study was 2015. Patient is here because he needs to machine and supplies   54 year old morbidly obese man presents to reestablish care for OSA. OSA was diagnosed in 2015 when he had an AHI of 21/hour suggesting moderate disease. He was lost to follow-up since then, he obtained his supplies online, initially got his machine from [place]. He settled down with a full facemask but wonders if he can switch to a nasal mask. His weight has fluctuated from 343 all the way up to 370 pounds and now down to 338 pounds. He wonders if his pressure needs to be adjusted since lately he has been feeling little sleepy especially when he drives to see his family up in [place] He also had an MI in 07/2019, required a stent and is now on metoprolol and Plavix.  Epworth sleepiness score is 13 and reports sleepiness in various social situations such as a passenger in a car, lying down in the afternoon or watching TV Bedtime is around 9 PM, sleep latency is minimal, he sleeps on his left side with 2 pillows reports 1-2 nocturnal awakenings and is out of bed by 5 AM feeling tired without dryness of mouth or headaches. There is no history suggestive of cataplexy, sleep paralysis or parasomnias  He smoked about 30 pack years before he quit in 2015   Significant tests/ events reviewed  11/2013 HST >> AHI 21/hour    Past Medical History:  Diagnosis Date  . History of adenomatous polyp of colon 06/02/2016  . Hyperlipidemia   . Hypertension   . NSTEMI (non-ST elevated myocardial infarction) ([place]) 08/05/2019  . S/P angioplasty with stent 08/06/19 with DES to large dominant distal RCA  08/07/2019  . Sciatica   . Sleep apnea    wears cpap     Past Surgical History:  Procedure Laterality Date  .  COLONOSCOPY WITH PROPOFOL N/A 05/30/2016   per Dr. Carlean Purl, adenomatous polyps, repeat in 7 yrs   . CORONARY STENT INTERVENTION N/A 08/06/2019   Procedure: CORONARY STENT INTERVENTION;  Surgeon: Troy Sine, MD;  Location: [place] [facility];  Service: Cardiovascular;  Laterality: N/A;  . KNEE SURGERY Left 2015   torn meniscus  . LEFT HEART CATH N/A 08/06/2019   Procedure: Left Heart Cath;  Surgeon: Troy Sine, MD;  Location: [place] [facility];  Service: Cardiovascular;  Laterality: N/A;  . LEFT HEART CATH AND CORONARY ANGIOGRAPHY N/A 08/05/2019   Procedure: LEFT HEART CATH AND CORONARY ANGIOGRAPHY;  Surgeon: Belva Crome, MD;  Location: [facility];  Service: Cardiovascular;  Laterality: N/A;    No Known Allergies  Social History   Socioeconomic History  . Marital status: Soil scientist    Spouse name: Not on file  . Number of children: Not on file  . Years of education: Not on file  . Highest education level: Not on file  Occupational History  . Occupation: Best boy: [employer]  Tobacco Use  . Smoking status: Former Smoker    Packs/day: 1.00    Years: 30.00    Pack years: 30.00    Types: Cigarettes    Quit date: 11/27/2013    Years since quitting: 5.8  .  Smokeless tobacco: Never Used  Substance and Sexual Activity  . Alcohol use: Yes    Alcohol/week: 0.0 standard drinks    Comment: 6 beers a month  . Drug use: No  . Sexual activity: Yes    Partners: Male    Birth control/protection: None  Other Topics Concern  . Not on file  Social History Narrative  . Not on file   Social Determinants of Health   Financial Resource Strain: Low Risk   . Difficulty of Paying Living Expenses: Not hard at all  Food Insecurity: No Food Insecurity  . Worried About Charity fundraiser in the Last Year: Never true  . Ran Out of Food in the Last Year: Never true  Transportation Needs: No Transportation Needs  . Lack of Transportation  (Medical): No  . Lack of Transportation (Non-Medical): No  Physical Activity: Sufficiently Active  . Days of Exercise per Week: 6 days  . Minutes of Exercise per Session: 90 min  Stress: No Stress Concern Present  . Feeling of Stress : Only a little  Social Connections: Unknown  . Frequency of Communication with Friends and Family: More than three times a week  . Frequency of Social Gatherings with Friends and Family: Not on file  . Attends Religious Services: Not on file  . Active Member of Clubs or Organizations: Yes  . Attends Archivist Meetings: More than 4 times per year  . Marital Status: Living with partner  Intimate Partner Violence:   . Fear of Current or Ex-Partner: Not on file  . Emotionally Abused: Not on file  . Physically Abused: Not on file  . Sexually Abused: Not on file     Family History  Problem Relation Age of Onset  . Healthy Sister   . Healthy Brother   . Ovarian cancer Sister   . Healthy Sister   . Healthy Mother   . Diabetes Father        Died at 74  . Diabetes Maternal Grandfather   . Heart attack Maternal Grandfather        Died in his early 57s  . Heart attack Maternal Uncle   . Cancer Maternal Uncle        throat  . Colon cancer Neg Hx   . Colon polyps Neg Hx   . Esophageal cancer Neg Hx   . Rectal cancer Neg Hx   . Stomach cancer Neg Hx     Review of Systems Constitutional: negative for anorexia, fevers and sweats  Eyes: negative for irritation, redness and visual disturbance  Ears, nose, mouth, throat, and face: negative for earaches, epistaxis, nasal congestion and sore throat  Respiratory: negative for cough, dyspnea on exertion, sputum and wheezing  Cardiovascular: negative for  dyspnea, lower extremity edema, orthopnea, palpitations and syncope  Gastrointestinal: negative for abdominal pain, constipation, diarrhea, melena, nausea and vomiting  Genitourinary:negative for dysuria, frequency and hematuria    Hematologic/lymphatic: negative for bleeding, easy bruising and lymphadenopathy  Musculoskeletal:negative for arthralgias, muscle weakness and stiff joints  Neurological: negative for coordination problems, gait problems, headaches and weakness  Endocrine: negative for diabetic symptoms including polydipsia, polyuria and weight loss     Objective:   Physical Exam  Gen. Pleasant, obese, in no distress, normal affect ENT - no pallor,icterus, no post nasal drip, class 2-3 airway Neck: No JVD, no thyromegaly, no carotid bruits Lungs: no use of accessory muscles, no dullness to percussion, decreased without rales or rhonchi  Cardiovascular: Rhythm regular,  heart sounds  normal, no murmurs or gallops, no peripheral edema Abdomen: soft and non-tender, no hepatosplenomegaly, BS normal. Musculoskeletal: No deformities, no cyanosis or clubbing Neuro:  alert, non focal, no tremors       Assessment & Plan:

## 2019-10-06 ENCOUNTER — Ambulatory Visit (HOSPITAL_COMMUNITY): Payer: BLUE CROSS/BLUE SHIELD

## 2019-10-08 ENCOUNTER — Ambulatory Visit (HOSPITAL_COMMUNITY): Payer: BLUE CROSS/BLUE SHIELD

## 2019-10-10 ENCOUNTER — Telehealth: Payer: Self-pay | Admitting: Interventional Cardiology

## 2019-10-10 ENCOUNTER — Ambulatory Visit (HOSPITAL_COMMUNITY): Payer: BLUE CROSS/BLUE SHIELD

## 2019-10-10 NOTE — Telephone Encounter (Signed)
Call returned to Pt.  Pt was interested in his fasting lipid results.  Lipid results looked good.  His total cholesterol is actually pretty low.  Will forward to SW to review and advise if Pt should make any cholesterol medication changes.  Advised Pt this nurse would call him back if any changes advised.

## 2019-10-10 NOTE — Telephone Encounter (Signed)
Lipids were reviewed on 09/23/2019. LDL is optimal.  Goal is < 70. HDL should be higher.  Goal is > 40.  Continue to work on diet and exercise to increase the HDL. Continue current dose of Atorvastatin. Richardson Dopp, PA-C    10/10/2019 1:23 PM

## 2019-10-10 NOTE — Telephone Encounter (Signed)
We are recommending the COVID-19 vaccine to all of our patients. Cardiac medications (including blood thinners) should not deter anyone from being vaccinated and there is no need to hold any of those medications prior to vaccine administration.     Currently, there is a hotline to call (active 09/26/19) to schedule vaccination appointments as no walk-ins will be accepted.   Number: [phone].    If an appointment is not available please go to [email or web address] to sign up for notification when additional vaccine appointments are available.   If you have further questions or concerns about the vaccine process, please visit [email or web address] or contact your primary care physician.   Spoke to the patient

## 2019-10-10 NOTE — Telephone Encounter (Signed)
New message:      Patient calling to get some lab results. Please call patient back.

## 2019-10-13 ENCOUNTER — Ambulatory Visit (HOSPITAL_COMMUNITY): Payer: BLUE CROSS/BLUE SHIELD

## 2019-10-15 ENCOUNTER — Ambulatory Visit (HOSPITAL_COMMUNITY): Payer: BLUE CROSS/BLUE SHIELD

## 2019-10-17 ENCOUNTER — Ambulatory Visit (HOSPITAL_COMMUNITY): Payer: BLUE CROSS/BLUE SHIELD

## 2019-10-20 ENCOUNTER — Ambulatory Visit (HOSPITAL_COMMUNITY): Payer: BLUE CROSS/BLUE SHIELD

## 2019-10-22 ENCOUNTER — Ambulatory Visit (HOSPITAL_COMMUNITY): Payer: BLUE CROSS/BLUE SHIELD

## 2019-10-23 ENCOUNTER — Telehealth (HOSPITAL_COMMUNITY): Payer: Self-pay

## 2019-10-23 NOTE — Telephone Encounter (Signed)
Called and left a message for pt to call back about scheduling his remaining sessions with cardiac rehab.

## 2019-10-23 NOTE — Telephone Encounter (Signed)
Pt insurance is active and benefits verified through BCBS Co-pay 0, DED $2,500/$2,461.90 met, out of pocket $5,000/$2,461.90 met, co-insurance 20%. no pre-authorization required. Passport, 10/23/2019_0 :15am, REF# [id number]  Will contact patient to see if he is interested in the Cardiac Rehab Program. If interested, patient will need to complete follow up appt. Once completed, patient will be contacted for scheduling upon review by the RN Navigator.

## 2019-10-23 NOTE — Addendum Note (Signed)
Encounter addended by: Sol Passer on: 10/23/2019 3:10 PM  Actions taken: Flowsheet data copied forward, Flowsheet accepted

## 2019-10-24 ENCOUNTER — Ambulatory Visit (HOSPITAL_COMMUNITY): Payer: BLUE CROSS/BLUE SHIELD

## 2019-10-27 ENCOUNTER — Ambulatory Visit (HOSPITAL_COMMUNITY): Payer: BLUE CROSS/BLUE SHIELD

## 2019-10-27 ENCOUNTER — Telehealth (HOSPITAL_COMMUNITY): Payer: Self-pay

## 2019-10-27 ENCOUNTER — Encounter (HOSPITAL_COMMUNITY)
Admission: RE | Admit: 2019-10-27 | Discharge: 2019-10-27 | Disposition: A | Discharge status: Home / Self Care | Payer: Self-pay | Source: Ambulatory Visit | Attending: Interventional Cardiology | Admitting: Interventional Cardiology

## 2019-10-27 ENCOUNTER — Encounter (HOSPITAL_COMMUNITY): Payer: Self-pay

## 2019-10-27 ENCOUNTER — Other Ambulatory Visit: Payer: Self-pay

## 2019-10-27 DIAGNOSIS — Z95820 Peripheral vascular angioplasty status with implants and grafts: Secondary | ICD-10-CM | POA: Insufficient documentation

## 2019-10-27 DIAGNOSIS — I214 Non-ST elevation (NSTEMI) myocardial infarction: Secondary | ICD-10-CM

## 2019-10-27 NOTE — Progress Notes (Signed)
Virtual Cardiac Rehab Note;  Incoming call from Mr. Jeremy Guerrero in response to RN left message. Mr. Jeremy Guerrero continues to do well with his exercise. He is exercising daily at his community gym. States he does 60 min of aerobic exercise including treadmill and/or stairmaster. He is also incorporating intermittent weight training into his sessions. He reports significant weight loss through dietary changes and exercise. He has reduced his waist size from 54 (08/04/20) to 48 at present. States he will soon be able to "fit into my 46 jeans" within the next month. RN commended patient on his weight loss success and his commitment to improve his health.  Plan: Will continue to follow patient via [facility] app  Solomon Skowronek E. Rollene Rotunda RN, BSN [place]. [facility]  Cardiac and Pulmonary Rehabilitation [place] Direct: [phone]

## 2019-10-27 NOTE — Telephone Encounter (Signed)
Virtual Cardiac Rehab Note:  Unsuccessful telephone encounter to Dallie Piles to follow up on compliance with cardiac rehab plan of care. [employer] compliant VM message left requesting call back to [phone].  Karriem Muench E. Rollene Rotunda RN, BSN [place]. [facility]  Cardiac and Pul[facility] [place] Direct: [phone]

## 2019-10-27 NOTE — Progress Notes (Signed)
Cardiac Individual Treatment Plan  Patient Details  Name: Jeremy Guerrero MRN: [id number] Date of Birth: Jan 23, 1966 Referring Provider:     CARDIAC REHAB PHASE II ORIENTATION from 09/09/2019 in [facility]  Referring Provider  Dr. Irish Lack      Initial Encounter Date:    CARDIAC REHAB PHASE II ORIENTATION from 09/09/2019 in [facility]  Date  09/09/19      Visit Diagnosis: NSTEMI (non-ST elevated myocardial infarction) ([place])  S/P angioplasty with stent 08/06/19 with DES to large dominant distal RCA   Patient's Home Medications on Admission:  Current Outpatient Medications:  .  acetaminophen (TYLENOL) 325 MG tablet, Take 2 tablets (650 mg total) by mouth every 4 (four) hours as needed for headache or mild pain., Disp: , Rfl:  .  atorvastatin (LIPITOR) 80 MG tablet, Take 1 tablet (80 mg total) by mouth daily at 6 PM., Disp: 30 tablet, Rfl: 6 .  emtricitabine-tenofovir AF (DESCOVY) 200-25 MG tablet, Take 1 tablet by mouth daily., Disp: 30 tablet, Rfl: 2 .  metoprolol succinate (TOPROL XL) 25 MG 24 hr tablet, Take 1 tablet (25 mg total) by mouth daily., Disp: 90 tablet, Rfl: 3 .  nitroGLYCERIN (NITROSTAT) 0.4 MG SL tablet, Place 1 tablet (0.4 mg total) under the tongue every 5 (five) minutes x 3 doses as needed for chest pain., Disp: 25 tablet, Rfl: 4 .  ticagrelor (BRILINTA) 90 MG TABS tablet, Take 1 tablet (90 mg total) by mouth 2 (two) times daily., Disp: 60 tablet, Rfl: 11  Past Medical History: Past Medical History:  Diagnosis Date  . History of adenomatous polyp of colon 06/02/2016  . Hyperlipidemia   . Hypertension   . NSTEMI (non-ST elevated myocardial infarction) ([place]) 08/05/2019  . S/P angioplasty with stent 08/06/19 with DES to large dominant distal RCA  08/07/2019  . Sciatica   . Sleep apnea    wears cpap    Tobacco Use: Social History   Tobacco Use  Smoking Status Former Smoker  . Packs/day: 1.00  .  Years: 30.00  . Pack years: 30.00  . Types: Cigarettes  . Quit date: 11/27/2013  . Years since quitting: 5.9  Smokeless Tobacco Never Used    Labs: Recent Review Flowsheet Data    Labs for ITP Cardiac and Pulmonary Rehab Latest Ref Rng & Units 10/28/2013 01/26/2016 07/15/2019 08/06/2019 09/22/2019   Cholestrol 100 - 199 mg/dL 203(H) 234(H) 194 - 81(L)   LDLCALC 0 - 99 mg/dL - 163(H) 132(H) - 33   LDLDIRECT mg/dL 143.2 - - - -   HDL >39 mg/dL 29.00(L) 34.20(L) 29.40(L) - 30(L)   Trlycerides 0 - 149 mg/dL 202.0(H) 185.0(H) 160.0(H) - 86   Hemoglobin A1c 4.8 - 5.6 % - - 5.7 5.6 -   TCO2 22 - 32 mmol/L - - - 25 -      Capillary Blood Glucose: No results found for: GLUCAP   Exercise Target Goals: Exercise Program Goal: Individual exercise prescription set using results from initial 6 min walk test and THRR while considering  patient's activity barriers and safety.   Exercise Prescription Goal: Initial exercise prescription builds to 30-45 minutes a day of aerobic activity, 2-3 days per week.  Home exercise guidelines will be given to patient during program as part of exercise prescription that the participant will acknowledge.  Activity Barriers & Risk Stratification: Activity Barriers & Cardiac Risk Stratification - 09/09/19 0933      Activity Barriers & Cardiac Risk  Stratification   Activity Barriers  None    Cardiac Risk Stratification  High       6 Minute Walk: 6 Minute Walk    Row Name 09/09/19 0932         6 Minute Walk   Phase  Initial     Distance  1824 feet     Walk Time  6 minutes     # of Rest Breaks  0     MPH  3.4     METS  3.6     RPE  11     Perceived Dyspnea   0     VO2 Peak  12.7     Symptoms  No     Resting HR  83 bpm     Resting BP  116/64     Resting Oxygen Saturation   96 %     Exercise Oxygen Saturation  during 6 min walk  97 %     Max Ex. HR  123 bpm     Max Ex. BP  134/68     2 Minute Post BP  116/66        Oxygen Initial  Assessment:   Oxygen Re-Evaluation:   Oxygen Discharge (Final Oxygen Re-Evaluation):   Initial Exercise Prescription: Initial Exercise Prescription - 09/09/19 0900      Date of Initial Exercise RX and Referring Provider   Date  09/09/19    Referring Provider  Dr. Irish Lack    Expected Discharge Date  11/07/19      Treadmill   MPH  3    Grade  1    Minutes  15      NuStep   Level  3    SPM  85    Minutes  15    METs  3.2      Prescription Details   Frequency (times per week)  3    Duration  Progress to 30 minutes of continuous aerobic without signs/symptoms of physical distress      Intensity   THRR 40-80% of Max Heartrate  66-133    Ratings of Perceived Exertion  11-13      Progression   Progression  Continue to progress workloads to maintain intensity without signs/symptoms of physical distress.      Resistance Training   Training Prescription  Yes    Weight  5 lbs.     Reps  10-15       Perform Capillary Blood Glucose checks as needed.  Exercise Prescription Changes:  Exercise Prescription Changes    Row Name 09/15/19 0900 10/19/19 1508           Response to Exercise   Blood Pressure (Admit)  100/66  --      Blood Pressure (Exercise)  130/64  --      Blood Pressure (Exit)  112/68  --      Heart Rate (Admit)  84 bpm  70 bpm      Heart Rate (Exercise)  106 bpm  120 bpm      Heart Rate (Exit)  94 bpm  --      Rating of Perceived Exertion (Exercise)  12  13      Perceived Dyspnea (Exercise)  0  --      Symptoms  None  None      Comments  Pt oriented to exercise equipment  Virtual cardiac rehab session      Duration  Progress to 30 minutes of  aerobic without signs/symptoms of physical distress  Progress to 30 minutes of  aerobic without signs/symptoms of physical distress      Intensity  THRR unchanged  THRR unchanged        Progression   Progression  Continue to progress workloads to maintain intensity without signs/symptoms of physical distress.   Continue to progress workloads to maintain intensity without signs/symptoms of physical distress.      Average METs  3.4  4.1        Resistance Training   Training Prescription  Yes  --      Weight  6lbs  --      Reps  10-15  --      Time  10 Minutes  --        Interval Training   Interval Training  No  --        Treadmill   MPH  3.5  4      Grade  1  --      Minutes  15  60      METs  3.71  4.06        NuStep   Level  3  --      SPM  95  --      Minutes  15  --      METs  3  --        Home Exercise Plan   Plans to continue exercise at  [employer] (comment)  Forensic scientist (comment)      Frequency  Add 3 additional days to program exercise sessions.  Add 3 additional days to program exercise sessions.      Initial Home Exercises Provided  09/15/19  09/15/19         Exercise Comments:  Exercise Comments    Row Name 09/15/19 0959 10/23/19 1506         Exercise Comments  Pt's first day of Cardiac Rehab. Pt oriented to exercise equipment. Reviewed exercise plans for departmental closure. Pt currently exercises at local gym 5-6 days a week for 45-90 minutes. Will follow up with pt next session to make sure answers/concerns are addressed regarding better hearts app.  Patient will be contacted to resume participation in the onsite cardiac rehab program after temporary closure due to COVID-19 pandemic.         Exercise Goals and Review:  Exercise Goals    Row Name 09/09/19 0935             Exercise Goals   Increase Physical Activity  Yes       Intervention  Provide advice, education, support and counseling about physical activity/exercise needs.;Develop an individualized exercise prescription for aerobic and resistive training based on initial evaluation findings, risk stratification, comorbidities and participant's personal goals.       Expected Outcomes  Short Term: Attend rehab on a regular basis to increase amount of physical activity.;Long Term: Add in  home exercise to make exercise part of routine and to increase amount of physical activity.;Long Term: Exercising regularly at least 3-5 days a week.       Increase Strength and Stamina  Yes       Intervention  Provide advice, education, support and counseling about physical activity/exercise needs.;Develop an individualized exercise prescription for aerobic and resistive training based on initial evaluation findings, risk stratification, comorbidities and participant's personal goals.       Expected Outcomes  Short Term: Increase workloads from initial exercise prescription for  resistance, speed, and METs.;Short Term: Perform resistance training exercises routinely during rehab and add in resistance training at home;Long Term: Improve cardiorespiratory fitness, muscular endurance and strength as measured by increased METs and functional capacity (6MWT)       Able to understand and use rate of perceived exertion (RPE) scale  Yes       Intervention  Provide education and explanation on how to use RPE scale       Expected Outcomes  Short Term: Able to use RPE daily in rehab to express subjective intensity level;Long Term:  Able to use RPE to guide intensity level when exercising independently       Knowledge and understanding of Target Heart Rate Range (THRR)  Yes       Intervention  Provide education and explanation of THRR including how the numbers were predicted and where they are located for reference       Expected Outcomes  Short Term: Able to state/look up THRR;Long Term: Able to use THRR to govern intensity when exercising independently;Short Term: Able to use daily as guideline for intensity in rehab       Able to check pulse independently  Yes       Intervention  Provide education and demonstration on how to check pulse in carotid and radial arteries.;Review the importance of being able to check your own pulse for safety during independent exercise       Expected Outcomes  Short Term: Able to  explain why pulse checking is important during independent exercise;Long Term: Able to check pulse independently and accurately       Understanding of Exercise Prescription  Yes       Intervention  Provide education, explanation, and written materials on patient's individual exercise prescription       Expected Outcomes  Short Term: Able to explain program exercise prescription;Long Term: Able to explain home exercise prescription to exercise independently          Exercise Goals Re-Evaluation : Exercise Goals Re-Evaluation    [place] Name 09/15/19 1006 10/23/19 1504           Exercise Goal Re-Evaluation   Exercise Goals Review  Increase Physical Activity;Able to understand and use rate of perceived exertion (RPE) scale  Increase Physical Activity;Able to understand and use rate of perceived exertion (RPE) scale      Comments  Pt oriented to exercise equipment. Pt responded well to exercise prescription.  Patient has been actively participating in the virtual cardiac rehab program via the [facility] app and has been progressing well. Patient is exercising at a community gym using the treadmill and elliptical daily. Patient does ~60 minutes of cardio and 30 minutes of weight training. Patient doesn' tlog in the app daily because he has a Fitbit that automatically logs his exercise.Patient has lost ~10lbs.      Expected Outcomes  Pt will continue to exercise most days of the week for 60-90 minutes. Will discuss with pt regarding making workouts more focused to help cut down on time spent at gym.  Patient will transition back to the onsite cardiac rehab program.         Discharge Exercise Prescription (Final Exercise Prescription Changes): Exercise Prescription Changes - 10/19/19 1508      Response to Exercise   Heart Rate (Admit)  70 bpm    Heart Rate (Exercise)  120 bpm    Rating of Perceived Exertion (Exercise)  13    Symptoms  None  Comments  Virtual cardiac rehab session    Duration   Progress to 30 minutes of  aerobic without signs/symptoms of physical distress    Intensity  THRR unchanged      Progression   Progression  Continue to progress workloads to maintain intensity without signs/symptoms of physical distress.    Average METs  4.1      Treadmill   MPH  4    Minutes  60    METs  4.06      Home Exercise Plan   Plans to continue exercise at  [facility] (comment)    Frequency  Add 3 additional days to program exercise sessions.    Initial Home Exercises Provided  09/15/19       Nutrition:  Target Goals: Understanding of nutrition guidelines, daily intake of sodium 1500mg , cholesterol 200mg , calories 30% from fat and 7% or less from saturated fats, daily to have 5 or more servings of fruits and vegetables.  Biometrics: Pre Biometrics - 09/09/19 0936      Pre Biometrics   Height  5' 10.25" (1.784 m)    Weight  (!) 343 lb 4.1 oz (155.7 kg)    Waist Circumference  51.5 inches    Hip Circumference  58.5 inches    Waist to Hip Ratio  0.88 %    BMI (Calculated)  48.92    Triceps Skinfold  28 mm    % Body Fat  42.8 %    Grip Strength  38 kg    Flexibility  9.5 in    Single Leg Stand  30 seconds        Nutrition Therapy Plan and Nutrition Goals:   Nutrition Assessments:   Nutrition Goals Re-Evaluation:   Nutrition Goals Re-Evaluation:   Nutrition Goals Discharge (Final Nutrition Goals Re-Evaluation):   Psychosocial: Target Goals: Acknowledge presence or absence of significant depression and/or stress, maximize coping skills, provide positive support system. Participant is able to verbalize types and ability to use techniques and skills needed for reducing stress and depression.  Initial Review & Psychosocial Screening: Initial Psych Review & Screening - 09/09/19 0935      Initial Review   Current issues with  None Identified      Family Dynamics   Good Support System?  Yes    Comments  Mr. Mcghie denies barriers to  participation in CR and self health management. He has a positive attitude and outlook. He acknolodges a strong support system including family, friends, and health care team. No psychosocial interventions needed at this time.      Barriers   Psychosocial barriers to participate in program  There are no identifiable barriers or psychosocial needs.      Screening Interventions   Interventions  Encouraged to exercise       Quality of Life Scores: Quality of Life - 09/09/19 0938      Quality of Life   Select  Quality of Life      Quality of Life Scores   Health/Function Pre  19.2 %    Socioeconomic Pre  22.14 %    Psych/Spiritual Pre  25.14 %    Family Pre  21.63 %    GLOBAL Pre  21.38 %      Scores of 19 and below usually indicate a poorer quality of life in these areas.  A difference of  2-3 points is a clinically meaningful difference.  A difference of 2-3 points in the total score of the Quality  of Life Index has been associated with significant improvement in overall quality of life, self-image, physical symptoms, and general health in studies assessing change in quality of life.  PHQ-9: Recent Review Flowsheet Data    Depression screen [facility] 2/9 09/09/2019 05/30/2016 04/15/2015 07/20/2014 06/30/2014   Decreased Interest 0 0 0 0 0   Down, Depressed, Hopeless 0 0 0 0 0   PHQ - 2 Score 0 0 0 0 0     Interpretation of Total Score  Total Score Depression Severity:  1-4 = Minimal depression, 5-9 = Mild depression, 10-14 = Moderate depression, 15-19 = Moderately severe depression, 20-27 = Severe depression   Psychosocial Evaluation and Intervention: Psychosocial Evaluation - 09/15/19 [id number]      Psychosocial Evaluation & Interventions   Interventions  Encouraged to exercise with the program and follow exercise prescription    Comments  No psychosocial interventions necessary.  He enjoys yard working, fishing, camping, and Investment banker, operational.    Expected Outcomes  Butch will maintain a  positive outlook with good coping skills.    Continue Psychosocial Services   No Follow up required       Psychosocial Re-Evaluation: Psychosocial Re-Evaluation    [place] Name 10/27/19 1345             Psychosocial Re-Evaluation   Current issues with  None Identified       Comments  Per last conversation with patient via telephone, he continues to do well managing any stressors that may occurr. He has a very supportive partner who is encouraging Divon to make needed lifestyle changes to better his health.       Expected Outcomes  Patient will continue to have a positive attitude and outlook. He will utilize support from his partner to deal with any stressors that may arise.       Continue Psychosocial Services   No Follow up required          Psychosocial Discharge (Final Psychosocial Re-Evaluation): Psychosocial Re-Evaluation - 10/27/19 1345      Psychosocial Re-Evaluation   Current issues with  None Identified    Comments  Per last conversation with patient via telephone, he continues to do well managing any stressors that may occurr. He has a very supportive partner who is encouraging Basheer to make needed lifestyle changes to better his health.    Expected Outcomes  Patient will continue to have a positive attitude and outlook. He will utilize support from his partner to deal with any stressors that may arise.    Continue Psychosocial Services   No Follow up required       Vocational Rehabilitation: Provide vocational rehab assistance to qualifying candidates.   Vocational Rehab Evaluation & Intervention:   Education: Education Goals: Education classes will be provided on a weekly basis, covering required topics. Participant will state understanding/return demonstration of topics presented.  Learning Barriers/Preferences: Learning Barriers/Preferences - 09/09/19 0939      Learning Barriers/Preferences   Learning Barriers  None    Learning Preferences  Written  Material       Education Topics: Count Your Pulse:  -Group instruction provided by verbal instruction, demonstration, patient participation and written materials to support subject.  Instructors address importance of being able to find your pulse and how to count your pulse when at home without a heart monitor.  Patients get hands on experience counting their pulse with staff help and individually.   Heart Attack, Angina, and Risk Factor Modification:  -Group instruction provided  by verbal instruction, video, and written materials to support subject.  Instructors address signs and symptoms of angina and heart attacks.    Also discuss risk factors for heart disease and how to make changes to improve heart health risk factors.   Functional Fitness:  -Group instruction provided by verbal instruction, demonstration, patient participation, and written materials to support subject.  Instructors address safety measures for doing things around the house.  Discuss how to get up and down off the floor, how to pick things up properly, how to safely get out of a chair without assistance, and balance training.   Meditation and Mindfulness:  -Group instruction provided by verbal instruction, patient participation, and written materials to support subject.  Instructor addresses importance of mindfulness and meditation practice to help reduce stress and improve awareness.  Instructor also leads participants through a meditation exercise.    Stretching for Flexibility and Mobility:  -Group instruction provided by verbal instruction, patient participation, and written materials to support subject.  Instructors lead participants through series of stretches that are designed to increase flexibility thus improving mobility.  These stretches are additional exercise for major muscle groups that are typically performed during regular warm up and cool down.   Hands Only CPR:  -Group verbal, video, and participation  provides a basic overview of AHA guidelines for community CPR. Role-play of emergencies allow participants the opportunity to practice calling for help and chest compression technique with discussion of AED use.   Hypertension: -Group verbal and written instruction that provides a basic overview of hypertension including the most recent diagnostic guidelines, risk factor reduction with self-care instructions and medication management.    Nutrition I class: Heart Healthy Eating:  -Group instruction provided by PowerPoint slides, verbal discussion, and written materials to support subject matter. The instructor gives an explanation and review of the Therapeutic Lifestyle Changes diet recommendations, which includes a discussion on lipid goals, dietary fat, sodium, fiber, plant stanol/sterol esters, sugar, and the components of a well-balanced, healthy diet.   Nutrition II class: Lifestyle Skills:  -Group instruction provided by PowerPoint slides, verbal discussion, and written materials to support subject matter. The instructor gives an explanation and review of label reading, grocery shopping for heart health, heart healthy recipe modifications, and ways to make healthier choices when eating out.   Diabetes Question & Answer:  -Group instruction provided by PowerPoint slides, verbal discussion, and written materials to support subject matter. The instructor gives an explanation and review of diabetes co-morbidities, pre- and post-prandial blood glucose goals, pre-exercise blood glucose goals, signs, symptoms, and treatment of hypoglycemia and hyperglycemia, and foot care basics.   Diabetes Blitz:  -Group instruction provided by PowerPoint slides, verbal discussion, and written materials to support subject matter. The instructor gives an explanation and review of the physiology behind type 1 and type 2 diabetes, diabetes medications and rational behind using different medications, pre- and  post-prandial blood glucose recommendations and Hemoglobin A1c goals, diabetes diet, and exercise including blood glucose guidelines for exercising safely.    Portion Distortion:  -Group instruction provided by PowerPoint slides, verbal discussion, written materials, and food models to support subject matter. The instructor gives an explanation of serving size versus portion size, changes in portions sizes over the last 20 years, and what consists of a serving from each food group.   Stress Management:  -Group instruction provided by verbal instruction, video, and written materials to support subject matter.  Instructors review role of stress in heart disease and how to  cope with stress positively.     Exercising on Your Own:  -Group instruction provided by verbal instruction, power point, and written materials to support subject.  Instructors discuss benefits of exercise, components of exercise, frequency and intensity of exercise, and end points for exercise.  Also discuss use of nitroglycerin and activating EMS.  Review options of places to exercise outside of rehab.  Review guidelines for sex with heart disease.   Cardiac Drugs I:  -Group instruction provided by verbal instruction and written materials to support subject.  Instructor reviews cardiac drug classes: antiplatelets, anticoagulants, beta blockers, and statins.  Instructor discusses reasons, side effects, and lifestyle considerations for each drug class.   Cardiac Drugs II:  -Group instruction provided by verbal instruction and written materials to support subject.  Instructor reviews cardiac drug classes: angiotensin converting enzyme inhibitors (ACE-I), angiotensin II receptor blockers (ARBs), nitrates, and calcium channel blockers.  Instructor discusses reasons, side effects, and lifestyle considerations for each drug class.   Anatomy and Physiology of the Circulatory System:  Group verbal and written instruction and models  provide basic cardiac anatomy and physiology, with the coronary electrical and arterial systems. Review of: AMI, Angina, Valve disease, Heart Failure, Peripheral Artery Disease, Cardiac Arrhythmia, Pacemakers, and the ICD.   Other Education:  -Group or individual verbal, written, or video instructions that support the educational goals of the cardiac rehab program.   Holiday Eating Survival Tips:  -Group instruction provided by PowerPoint slides, verbal discussion, and written materials to support subject matter. The instructor gives patients tips, tricks, and techniques to help them not only survive but enjoy the holidays despite the onslaught of food that accompanies the holidays.   Knowledge Questionnaire Score: Knowledge Questionnaire Score - 09/09/19 0940      Knowledge Questionnaire Score   Pre Score  24/24       Core Components/Risk Factors/Patient Goals at Admission: Personal Goals and Risk Factors at Admission - 09/09/19 0939      Core Components/Risk Factors/Patient Goals on Admission    Weight Management  Yes;Obesity;Weight Maintenance;Weight Loss    Intervention  Weight Management: Develop a combined nutrition and exercise program designed to reach desired caloric intake, while maintaining appropriate intake of nutrient and fiber, sodium and fats, and appropriate energy expenditure required for the weight goal.;Weight Management: Provide education and appropriate resources to help participant work on and attain dietary goals.;Weight Management/Obesity: Establish reasonable short term and long term weight goals.;Obesity: Provide education and appropriate resources to help participant work on and attain dietary goals.    Admit Weight  343 lb 4.1 oz (155.7 kg)    Goal Weight: Short Term  293 lb (132.9 kg)    Goal Weight: Long Term  200 lb (90.7 kg)    Expected Outcomes  Short Term: Continue to assess and modify interventions until short term weight is achieved;Long Term:  Adherence to nutrition and physical activity/exercise program aimed toward attainment of established weight goal;Weight Maintenance: Understanding of the daily nutrition guidelines, which includes 25-35% calories from fat, 7% or less cal from saturated fats, less than 200mg  cholesterol, less than 1.5gm of sodium, & 5 or more servings of fruits and vegetables daily;Weight Loss: Understanding of general recommendations for a balanced deficit meal plan, which promotes 1-2 lb weight loss per week and includes a negative energy balance of [id number] kcal/d;Understanding recommendations for meals to include 15-35% energy as protein, 25-35% energy from fat, 35-60% energy from carbohydrates, less than 200mg  of dietary cholesterol, 20-35 gm of total  fiber daily;Understanding of distribution of calorie intake throughout the day with the consumption of 4-5 meals/snacks    Hypertension  Yes    Intervention  Provide education on lifestyle modifcations including regular physical activity/exercise, weight management, moderate sodium restriction and increased consumption of fresh fruit, vegetables, and low fat dairy, alcohol moderation, and smoking cessation.;Monitor prescription use compliance.    Expected Outcomes  Short Term: Continued assessment and intervention until BP is < 140/24mm HG in hypertensive participants. < 130/27mm HG in hypertensive participants with diabetes, heart failure or chronic kidney disease.;Long Term: Maintenance of blood pressure at goal levels.    Lipids  Yes    Intervention  Provide education and support for participant on nutrition & aerobic/resistive exercise along with prescribed medications to achieve LDL 70mg , HDL >40mg .    Expected Outcomes  Short Term: Participant states understanding of desired cholesterol values and is compliant with medications prescribed. Participant is following exercise prescription and nutrition guidelines.;Long Term: Cholesterol controlled with medications as  prescribed, with individualized exercise RX and with personalized nutrition plan. Value goals: LDL < 70mg , HDL > 40 mg.       Core Components/Risk Factors/Patient Goals Review:  Goals and Risk Factor Review    Row Name 09/15/19 0835 10/27/19 1347           Core Components/Risk Factors/Patient Goals Review   Personal Goals Review  Weight Management/Obesity;Lipids;Hypertension  Weight Management/Obesity;Lipids;Hypertension      Review  Pt with multiple CAD RFs eager to articipate in CR exercise.  [name] is currently working out at a [employer].  Butch would like to continue to lose weight and change his lifestyle to healthy.  Patient with multiple CAD risk factors.  Continues to participate in cardiac rehab plan of care/exercise utilizing the [facility] virtual cardiac rehab app.      Expected Outcomes  Butch will continue to participate in CR exercise, nutrition, and lifestyle modification opportunities.  Butch will continue to participate in CR exercise, nutrition, and lifestyle modification opportunities.         Core Components/Risk Factors/Patient Goals at Discharge (Final Review):  Goals and Risk Factor Review - 10/27/19 1347      Core Components/Risk Factors/Patient Goals Review   Personal Goals Review  Weight Management/Obesity;Lipids;Hypertension    Review  Patient with multiple CAD risk factors.  Continues to participate in cardiac rehab plan of care/exercise utilizing the [facility] virtual cardiac rehab app.    Expected Outcomes  Butch will continue to participate in CR exercise, nutrition, and lifestyle modification opportunities.       ITP Comments: ITP Comments    Row Name 09/09/19 0810 09/15/19 0826 10/27/19 1343       ITP Comments  Dr. Fransico Him, Medical Director Cardiac [place] started exercise today and tolerated it well.  In-person Cardiac Rehab exercise session will resume. Patient will be contacted to see if interested. Currently patient  is following their cardiac rehab plan of care utilizing the [facility] app.        Comments: see ITP comments

## 2019-10-29 ENCOUNTER — Ambulatory Visit (HOSPITAL_COMMUNITY): Payer: BLUE CROSS/BLUE SHIELD

## 2019-10-31 ENCOUNTER — Ambulatory Visit (HOSPITAL_COMMUNITY): Payer: BLUE CROSS/BLUE SHIELD

## 2019-11-03 ENCOUNTER — Other Ambulatory Visit: Payer: Self-pay

## 2019-11-03 ENCOUNTER — Ambulatory Visit (HOSPITAL_COMMUNITY): Payer: BLUE CROSS/BLUE SHIELD

## 2019-11-04 ENCOUNTER — Encounter: Payer: Self-pay | Admitting: Family Medicine

## 2019-11-04 ENCOUNTER — Other Ambulatory Visit (HOSPITAL_COMMUNITY): Payer: Self-pay | Admitting: Family Medicine

## 2019-11-04 ENCOUNTER — Ambulatory Visit (INDEPENDENT_AMBULATORY_CARE_PROVIDER_SITE_OTHER): Payer: BC Managed Care – PPO | Admitting: Family Medicine

## 2019-11-04 VITALS — BP 130/70 | HR 79 | Temp 98.0°F | Wt 324.4 lb

## 2019-11-04 DIAGNOSIS — N529 Male erectile dysfunction, unspecified: Secondary | ICD-10-CM | POA: Diagnosis not present

## 2019-11-04 MED ORDER — SILDENAFIL CITRATE 100 MG PO TABS: 100.0000 mg | ORAL_TABLET | Freq: Every day | ORAL | 11 refills | Status: DC | PRN

## 2019-11-04 MED FILL — SILDENAFIL CITRATE 100 MG T: 100 | 10 days supply | Qty: 10 | Fill #0

## 2019-11-04 NOTE — Progress Notes (Signed)
   Subjective:    Patient ID: Jeremy Guerrero, male    DOB: 1966/06/27, 54 y.o.   MRN: [id number]  HPI Here for help with erections. He says that in the past few months he has had trouble both getting and maintaining erections, and this is a new issue for him. His desire is strong. He feels well in general since his heart attack. He works out at [employer] 6-7 days a week and he is losing weight.  He was given some SL NTG to use as needed, but he has never used this. He is not on daily nitrates.   Review of Systems  Constitutional: Negative.   Respiratory: Negative.   Cardiovascular: Negative.        Objective:   Physical Exam Constitutional:      Appearance: Normal appearance.  Cardiovascular:     Rate and Rhythm: Normal rate and regular rhythm.     Pulses: Normal pulses.     Heart sounds: Normal heart sounds.  Pulmonary:     Effort: Pulmonary effort is normal.     Breath sounds: Normal breath sounds.  Neurological:     Mental Status: He is alert.           Assessment & Plan:  ED, try Sildenafil 100 mg as needed.  Alysia Penna, MD

## 2019-11-05 ENCOUNTER — Ambulatory Visit (HOSPITAL_COMMUNITY): Payer: BLUE CROSS/BLUE SHIELD

## 2019-11-06 MED FILL — BRILINTA 90 MG TABLET: 90 | 30 days supply | Qty: 60 | Fill #2

## 2019-11-07 ENCOUNTER — Ambulatory Visit (HOSPITAL_COMMUNITY): Payer: BLUE CROSS/BLUE SHIELD

## 2019-11-11 NOTE — Progress Notes (Signed)
Cardiology Office Note   Date:  11/12/2019   ID:  Jeremy Guerrero, DOB Mar 13, 1966, MRN [id number]  PCP:  Laurey Morale, MD    No chief complaint on file.  CAD/Old MI  Wt Readings from Last 3 Encounters:  11/12/19 (!) 337 lb 12.8 oz (153.2 kg)  11/04/19 (!) 324 lb 6.4 oz (147.1 kg)  10/03/19 (!) 338 lb (153.3 kg)       History of Present Illness: Jeremy Guerrero is a 54 y.o. male   with:   Coronary artery disease ? S/p NSTEMI 07/2019 >> PCI: DES to RCA ? ACS 2? to plaque rupture in the RCA with distal embolization  Hypertension   Hyperlipidemia   OSA  [name] was admitted 11/17-11/19 with a NSTEMI.  Cardiac catheterization demonstrated mid RCA 65% stenosis and distal RCA 75% stenosis.  Acute coronary syndrome was felt to be due to plaque rupture with distal embolization in the ostium of the continuation of the RCA beyond the PDA.  Because of the suspected heavy plaque/thrombus burden in the distal RCA, the patient was kept on 12 to 24 hours of anticoagulation combined with 2b3a inhibitor and loaded with ticagrelor.  He was brought back 1 day later for intervention.  He underwent placement of a DES to the RCA.  Distal embolization remains at 60% stenosis.  The patient was not placed on beta-blocker therapy secondary to bradycardia. He was also not placed on ACE inhibitor due to soft BP.  EF was preserved on echocardiogram.  Since his MI, he lost 40 lbs over 3 months.  Denies : Chest pain. Dizziness. Leg edema. Nitroglycerin use. Orthopnea. Palpitations. Paroxysmal nocturnal dyspnea. Shortness of breath. Syncope.   Uses treadmill for 30-60 minutes. Part of that is a run, 5-10 minutes.  Lifts some weight as well.   No bleeding problems. Improved off of aspirin.   He is trying to eat healthy.  He is a Biomedical scientist.  He minimizes carbs in the diet.      Past Medical History:  Diagnosis Date  . History of adenomatous polyp of colon 06/02/2016  . Hyperlipidemia   .  Hypertension   . NSTEMI (non-ST elevated myocardial infarction) ([place]) 08/05/2019  . S/P angioplasty with stent 08/06/19 with DES to large dominant distal RCA  08/07/2019  . Sciatica   . Sleep apnea    wears cpap    Past Surgical History:  Procedure Laterality Date  . COLONOSCOPY WITH PROPOFOL N/A 05/30/2016   per Dr. Carlean Purl, adenomatous polyps, repeat in 7 yrs   . CORONARY STENT INTERVENTION N/A 08/06/2019   Procedure: CORONARY STENT INTERVENTION;  Surgeon: Troy Sine, MD;  Location: [facility];  Service: Cardiovascular;  Laterality: N/A;  . KNEE SURGERY Left 2015   torn meniscus  . LEFT HEART CATH N/A 08/06/2019   Procedure: Left Heart Cath;  Surgeon: Troy Sine, MD;  Location: [facility];  Service: Cardiovascular;  Laterality: N/A;  . LEFT HEART CATH AND CORONARY ANGIOGRAPHY N/A 08/05/2019   Procedure: LEFT HEART CATH AND CORONARY ANGIOGRAPHY;  Surgeon: Belva Crome, MD;  Location: [facility];  Service: Cardiovascular;  Laterality: N/A;     Current Outpatient Medications  Medication Sig Dispense Refill  . acetaminophen (TYLENOL) 325 MG tablet Take 2 tablets (650 mg total) by mouth every 4 (four) hours as needed for headache or mild pain.    Marland Kitchen atorvastatin (LIPITOR) 80 MG tablet Take 1 tablet (80 mg total) by  mouth daily at 6 PM. 30 tablet 6  . emtricitabine-tenofovir AF (DESCOVY) 200-25 MG tablet Take 1 tablet by mouth daily. 30 tablet 2  . metoprolol succinate (TOPROL XL) 25 MG 24 hr tablet Take 1 tablet (25 mg total) by mouth daily. 90 tablet 3  . nitroGLYCERIN (NITROSTAT) 0.4 MG SL tablet Place 1 tablet (0.4 mg total) under the tongue every 5 (five) minutes x 3 doses as needed for chest pain. 25 tablet 4  . sildenafil (VIAGRA) 100 MG tablet Take 1 tablet (100 mg total) by mouth daily as needed for erectile dysfunction. 10 tablet 11  . ticagrelor (BRILINTA) 90 MG TABS tablet Take 1 tablet (90 mg total) by mouth 2 (two) times daily. 60 tablet 11    No current facility-administered medications for this visit.    Allergies:   Patient has no known allergies.    Social History:  The patient  reports that he quit smoking about 5 years ago. His smoking use included cigarettes. He has a 30.00 pack-year smoking history. He has never used smokeless tobacco. He reports current alcohol use. He reports that he does not use drugs.   Family History:  The patient's family history includes Cancer in his maternal uncle; Diabetes in his father and maternal grandfather; Healthy in his brother, mother, sister, and sister; Heart attack in his maternal grandfather and maternal uncle; Ovarian cancer in his sister.    ROS:  Please see the history of present illness.   Otherwise, review of systems are positive for intentional weight loss.   All other systems are reviewed and negative.    PHYSICAL EXAM: VS:  BP 130/74   Pulse 67   Ht 6\' 1"  (1.854 m)   Wt (!) 337 lb 12.8 oz (153.2 kg)   SpO2 95%   BMI 44.57 kg/m  , BMI Body mass index is 44.57 kg/m. GEN: Well nourished, well developed, in no acute distress  HEENT: normal  Neck: no JVD, carotid bruits, or masses Cardiac: RRR; no murmurs, rubs, or gallops,no edema  Respiratory:  clear to auscultation bilaterally, normal work of breathing GI: soft, nontender, nondistended, + BS MS: no deformity or atrophy  Skin: warm and dry, no rash Neuro:  Strength and sensation are intact Psych: euthymic mood, full affect   EKG:   The ekg ordered 07/2019 demonstrates NSR, nonspecific inferior ST changes   Recent Labs: 07/15/2019: TSH 1.57 08/07/2019: BUN 9; Creatinine, Ser 0.73; Hemoglobin 15.7; Magnesium 2.0; Platelets 191; Potassium 3.8; Sodium 137 09/22/2019: ALT 29   Lipid Panel    Component Value Date/Time   CHOL 81 (L) 09/22/2019 0826   TRIG 86 09/22/2019 0826   HDL 30 (L) 09/22/2019 0826   CHOLHDL 2.7 09/22/2019 0826   CHOLHDL 7 07/15/2019 0841   VLDL 32.0 07/15/2019 0841   LDLCALC 33  09/22/2019 0826   LDLDIRECT 143.2 10/28/2013 0904     Other studies Reviewed: Additional studies/ records that were reviewed today with results demonstrating: hospital records and labs reviewed.   ASSESSMENT AND PLAN:  1.   CAD: On Brilinta monotherapy.  Change  To clopidogrel in 11/21. 2.   Old MI: No CHF.  3.   Morbid obesity: COntinue to lose weight. Continue regular exercise.  4.   Hyperlipidemia: TC 81 in Jan 2021.  Continue statin. Whole food plat based diet recommended.    Current medicines are reviewed at length with the patient today.  The patient concerns regarding his medicines were addressed.  The following changes  have been made:  No change  Labs/ tests ordered today include:  No orders of the defined types were placed in this encounter.   Recommend 150 minutes/week of aerobic exercise Low fat, low carb, high fiber diet recommended  Disposition:   FU in 9 months   Signed, Larae Grooms, MD  11/12/2019 3:33 PM    [facility] [place], [place], [place]  [address] Phone: [phone]; Fax: [phone]

## 2019-11-12 ENCOUNTER — Other Ambulatory Visit: Payer: Self-pay

## 2019-11-12 ENCOUNTER — Encounter: Payer: Self-pay | Admitting: Interventional Cardiology

## 2019-11-12 ENCOUNTER — Ambulatory Visit (INDEPENDENT_AMBULATORY_CARE_PROVIDER_SITE_OTHER): Payer: BC Managed Care – PPO | Admitting: Interventional Cardiology

## 2019-11-12 VITALS — BP 130/74 | HR 67 | Ht 73.0 in | Wt 337.8 lb

## 2019-11-12 DIAGNOSIS — I25118 Atherosclerotic heart disease of native coronary artery with other forms of angina pectoris: Secondary | ICD-10-CM

## 2019-11-12 DIAGNOSIS — E782 Mixed hyperlipidemia: Secondary | ICD-10-CM

## 2019-11-12 DIAGNOSIS — I252 Old myocardial infarction: Secondary | ICD-10-CM

## 2019-11-12 DIAGNOSIS — I10 Essential (primary) hypertension: Secondary | ICD-10-CM | POA: Diagnosis not present

## 2019-11-12 MED FILL — METOPROLOL SUCCINATE ER 25: 25 | 90 days supply | Qty: 90 | Fill #1

## 2019-11-12 MED FILL — ATORVASTATIN 80 MG TABLET: 80 | 30 days supply | Qty: 30 | Fill #2

## 2019-11-12 NOTE — Patient Instructions (Signed)
Medication Instructions:  Your physician recommends that you continue on your current medications as directed. Please refer to the Current Medication list given to you today.  *If you need a refill on your cardiac medications before your next appointment, please call your pharmacy*  Lab Work: None ordered  If you have labs (blood work) drawn today and your tests are completely normal, you will receive your results only by: Marland Kitchen MyChart Message (if you have MyChart) OR . A paper copy in the mail If you have any lab test that is abnormal or we need to change your treatment, we will call you to review the results.  Testing/Procedures: None ordered  Follow-Up: At [facility], you and your health needs are our priority.  As part of our continuing mission to provide you with exceptional heart care, we have created designated Provider Care Teams.  These Care Teams include your primary Cardiologist (physician) and Advanced Practice Providers (APPs -  Physician Assistants and Nurse Practitioners) who all work together to provide you with the care you need, when you need it.  Your next appointment:   9 month(s)  The format for your next appointment:   In Person  Provider:   You may see Larae Grooms, MD or one of the following Advanced Practice Providers on your designated Care Team:    Melina Copa, PA-C  Ermalinda Barrios, PA-C   Other Instructions

## 2019-12-01 ENCOUNTER — Telehealth (HOSPITAL_COMMUNITY): Payer: Self-pay

## 2019-12-01 NOTE — Telephone Encounter (Signed)
Virtual Cardiac Rehab Note:  Unsuccessful telephone encounter to Gleen Nortz to follow up on exercise plan post graduation from the [employer] VCR app 12/08/19 and to schedule post 6 min walktest. Hip[employer] compliant VM message left requesting call back to [phone].  Plan: Will await call back. Follow up by 12/05/19 if no return call  Jerrad Mendibles E. Rollene Rotunda RN, BSN [place]  Cardiac and Pulmonary Rehabilitation Phone: [phone] Fax: [phone]

## 2019-12-03 MED FILL — SILDENAFIL CITRATE 100 MG T: 100 | 10 days supply | Qty: 10 | Fill #1

## 2019-12-03 MED FILL — BRILINTA 90 MG TABLET: 90 | 30 days supply | Qty: 60 | Fill #3

## 2019-12-10 ENCOUNTER — Telehealth (HOSPITAL_COMMUNITY): Payer: Self-pay

## 2019-12-10 ENCOUNTER — Encounter (HOSPITAL_COMMUNITY): Payer: Self-pay

## 2019-12-10 NOTE — Telephone Encounter (Signed)
Virtual Cardiac Rehab Note:  Unsuccessful telephone encounter to Jeremy Guerrero to discuss his recent completion of the [facility] Virtual Cardiac Rehab App/Program. RN CM has attempted to contact patient multiple times to schedule follow up 6 min walk test without success. Hip[employer] compliant VM message left requesting call back however will continue with removal from app and program completion documentation.  Nekisha Mcdiarmid E. Rollene Rotunda RN, BSN [place]  Cardiac and Pulmonary Rehabilitation Phone: [phone] Fax: [phone]

## 2019-12-10 NOTE — Progress Notes (Signed)
Discharge Progress Report  Patient Details  Name: Jeremy Guerrero MRN: [id number] Date of Birth: 10-16-1965 Referring Provider:     CARDIAC REHAB PHASE II ORIENTATION from 09/09/2019 in [place]  Referring Provider  Dr. Irish Lack       Number of Visits: 13 weeks  Reason for Discharge: Patient has completed VCR program however unable to contact for wrap up appointment and post program 6 min walk test. No post assessments completed.   Smoking History:  Social History   Tobacco Use  Smoking Status Former Smoker  . Packs/day: 1.00  . Years: 30.00  . Pack years: 30.00  . Types: Cigarettes  . Quit date: 11/27/2013  . Years since quitting: 6.0  Smokeless Tobacco Never Used    Diagnosis:  No diagnosis found.  ADL UCSD:   Initial Exercise Prescription: Initial Exercise Prescription - 09/09/19 0900      Date of Initial Exercise RX and Referring Provider   Date  09/09/19    Referring Provider  Dr. Irish Lack    Expected Discharge Date  11/07/19      Treadmill   MPH  3    Grade  1    Minutes  15      NuStep   Level  3    SPM  85    Minutes  15    METs  3.2      Prescription Details   Frequency (times per week)  3    Duration  Progress to 30 minutes of continuous aerobic without signs/symptoms of physical distress      Intensity   THRR 40-80% of Max Heartrate  66-133    Ratings of Perceived Exertion  11-13      Progression   Progression  Continue to progress workloads to maintain intensity without signs/symptoms of physical distress.      Resistance Training   Training Prescription  Yes    Weight  5 lbs.     Reps  10-15       Discharge Exercise Prescription (Final Exercise Prescription Changes): Exercise Prescription Changes - 10/19/19 1508      Response to Exercise   Heart Rate (Admit)  70 bpm    Heart Rate (Exercise)  120 bpm    Rating of Perceived Exertion (Exercise)  13    Symptoms  None    Comments  Virtual cardiac  rehab session    Duration  Progress to 30 minutes of  aerobic without signs/symptoms of physical distress    Intensity  THRR unchanged      Progression   Progression  Continue to progress workloads to maintain intensity without signs/symptoms of physical distress.    Average METs  4.1      Treadmill   MPH  4    Minutes  60    METs  4.06      Home Exercise Plan   Plans to continue exercise at  [facility] (comment)    Frequency  Add 3 additional days to program exercise sessions.    Initial Home Exercises Provided  09/15/19       Functional Capacity: 6 Minute Walk    Row Name 09/09/19 0932         6 Minute Walk   Phase  Initial     Distance  1824 feet     Walk Time  6 minutes     # of Rest Breaks  0     MPH  3.4  METS  3.6     RPE  11     Perceived Dyspnea   0     VO2 Peak  12.7     Symptoms  No     Resting HR  83 bpm     Resting BP  116/64     Resting Oxygen Saturation   96 %     Exercise Oxygen Saturation  during 6 min walk  97 %     Max Ex. HR  123 bpm     Max Ex. BP  134/68     2 Minute Post BP  116/66        Psychological, QOL, Others - Outcomes: PHQ 2/9: Depression screen [facility] 2/9 09/09/2019 05/30/2016 04/15/2015 07/20/2014 06/30/2014  Decreased Interest 0 0 0 0 0  Down, Depressed, Hopeless 0 0 0 0 0  PHQ - 2 Score 0 0 0 0 0    Quality of Life: Quality of Life - 09/09/19 [id number]      Quality of Life   Select  Quality of Life      Quality of Life Scores   Health/Function Pre  19.2 %    Socioeconomic Pre  22.14 %    Psych/Spiritual Pre  25.14 %    Family Pre  21.63 %    GLOBAL Pre  21.38 %       Personal Goals: Goals established at orientation with interventions provided to work toward goal. Personal Goals and Risk Factors at Admission - 09/09/19 0939      Core Components/Risk Factors/Patient Goals on Admission    Weight Management  Yes;Obesity;Weight Maintenance;Weight Loss    Intervention  Weight Management: Develop a combined  nutrition and exercise program designed to reach desired caloric intake, while maintaining appropriate intake of nutrient and fiber, sodium and fats, and appropriate energy expenditure required for the weight goal.;Weight Management: Provide education and appropriate resources to help participant work on and attain dietary goals.;Weight Management/Obesity: Establish reasonable short term and long term weight goals.;Obesity: Provide education and appropriate resources to help participant work on and attain dietary goals.    Admit Weight  343 lb 4.1 oz (155.7 kg)    Goal Weight: Short Term  293 lb (132.9 kg)    Goal Weight: Long Term  200 lb (90.7 kg)    Expected Outcomes  Short Term: Continue to assess and modify interventions until short term weight is achieved;Long Term: Adherence to nutrition and physical activity/exercise program aimed toward attainment of established weight goal;Weight Maintenance: Understanding of the daily nutrition guidelines, which includes 25-35% calories from fat, 7% or less cal from saturated fats, less than 200mg  cholesterol, less than 1.5gm of sodium, & 5 or more servings of fruits and vegetables daily;Weight Loss: Understanding of general recommendations for a balanced deficit meal plan, which promotes 1-2 lb weight loss per week and includes a negative energy balance of [phone] kcal/d;Understanding recommendations for meals to include 15-35% energy as protein, 25-35% energy from fat, 35-60% energy from carbohydrates, less than 200mg  of dietary cholesterol, 20-35 gm of total fiber daily;Understanding of distribution of calorie intake throughout the day with the consumption of 4-5 meals/snacks    Hypertension  Yes    Intervention  Provide education on lifestyle modifcations including regular physical activity/exercise, weight management, moderate sodium restriction and increased consumption of fresh fruit, vegetables, and low fat dairy, alcohol moderation, and smoking  cessation.;Monitor prescription use compliance.    Expected Outcomes  Short Term: Continued assessment and intervention until BP  is < 140/85mm HG in hypertensive participants. < 130/54mm HG in hypertensive participants with diabetes, heart failure or chronic kidney disease.;Long Term: Maintenance of blood pressure at goal levels.    Lipids  Yes    Intervention  Provide education and support for participant on nutrition & aerobic/resistive exercise along with prescribed medications to achieve LDL 70mg , HDL >40mg .    Expected Outcomes  Short Term: Participant states understanding of desired cholesterol values and is compliant with medications prescribed. Participant is following exercise prescription and nutrition guidelines.;Long Term: Cholesterol controlled with medications as prescribed, with individualized exercise RX and with personalized nutrition plan. Value goals: LDL < 70mg , HDL > 40 mg.        Personal Goals Discharge: Goals and Risk Factor Review    Row Name 09/15/19 0835 10/27/19 1347           Core Components/Risk Factors/Patient Goals Review   Personal Goals Review  Weight Management/Obesity;Lipids;Hypertension  Weight Management/Obesity;Lipids;Hypertension      Review  Pt with multiple CAD RFs eager to articipate in CR exercise.  [name] is currently working out at a [employer].  Butch would like to continue to lose weight and change his lifestyle to healthy.  Patient with multiple CAD risk factors.  Continues to participate in cardiac rehab plan of care/exercise utilizing the [employer] virtual cardiac rehab app.      Expected Outcomes  Butch will continue to participate in CR exercise, nutrition, and lifestyle modification opportunities.  Butch will continue to participate in CR exercise, nutrition, and lifestyle modification opportunities.         Exercise Goals and Review: Exercise Goals    Row Name 09/09/19 0935             Exercise Goals   Increase Physical  Activity  Yes       Intervention  Provide advice, education, support and counseling about physical activity/exercise needs.;Develop an individualized exercise prescription for aerobic and resistive training based on initial evaluation findings, risk stratification, comorbidities and participant's personal goals.       Expected Outcomes  Short Term: Attend rehab on a regular basis to increase amount of physical activity.;Long Term: Add in home exercise to make exercise part of routine and to increase amount of physical activity.;Long Term: Exercising regularly at least 3-5 days a week.       Increase Strength and Stamina  Yes       Intervention  Provide advice, education, support and counseling about physical activity/exercise needs.;Develop an individualized exercise prescription for aerobic and resistive training based on initial evaluation findings, risk stratification, comorbidities and participant's personal goals.       Expected Outcomes  Short Term: Increase workloads from initial exercise prescription for resistance, speed, and METs.;Short Term: Perform resistance training exercises routinely during rehab and add in resistance training at home;Long Term: Improve cardiorespiratory fitness, muscular endurance and strength as measured by increased METs and functional capacity (6MWT)       Able to understand and use rate of perceived exertion (RPE) scale  Yes       Intervention  Provide education and explanation on how to use RPE scale       Expected Outcomes  Short Term: Able to use RPE daily in rehab to express subjective intensity level;Long Term:  Able to use RPE to guide intensity level when exercising independently       Knowledge and understanding of Target Heart Rate Range (THRR)  Yes  Intervention  Provide education and explanation of THRR including how the numbers were predicted and where they are located for reference       Expected Outcomes  Short Term: Able to state/look up THRR;Long  Term: Able to use THRR to govern intensity when exercising independently;Short Term: Able to use daily as guideline for intensity in rehab       Able to check pulse independently  Yes       Intervention  Provide education and demonstration on how to check pulse in carotid and radial arteries.;Review the importance of being able to check your own pulse for safety during independent exercise       Expected Outcomes  Short Term: Able to explain why pulse checking is important during independent exercise;Long Term: Able to check pulse independently and accurately       Understanding of Exercise Prescription  Yes       Intervention  Provide education, explanation, and written materials on patient's individual exercise prescription       Expected Outcomes  Short Term: Able to explain program exercise prescription;Long Term: Able to explain home exercise prescription to exercise independently          Exercise Goals Re-Evaluation: Exercise Goals Re-Evaluation    Mona Name 09/15/19 1006 10/23/19 1504           Exercise Goal Re-Evaluation   Exercise Goals Review  Increase Physical Activity;Able to understand and use rate of perceived exertion (RPE) scale  Increase Physical Activity;Able to understand and use rate of perceived exertion (RPE) scale      Comments  Pt oriented to exercise equipment. Pt responded well to exercise prescription.  Patient has been actively participating in the virtual cardiac rehab program via the [employer] app and has been progressing well. Patient is exercising at a community gym using the treadmill and elliptical daily. Patient does ~60 minutes of cardio and 30 minutes of weight training. Patient doesn' tlog in the app daily because he has a Fitbit that automatically logs his exercise.Patient has lost ~10lbs.      Expected Outcomes  Pt will continue to exercise most days of the week for 60-90 minutes. Will discuss with pt regarding making workouts more focused to help cut  down on time spent at gym.  Patient will transition back to the onsite cardiac rehab program.         Nutrition & Weight - Outcomes: Pre Biometrics - 09/09/19 0936      Pre Biometrics   Height  5' 10.25" (1.784 m)    Weight  (!) 343 lb 4.1 oz (155.7 kg)    Waist Circumference  51.5 inches    Hip Circumference  58.5 inches    Waist to Hip Ratio  0.88 %    BMI (Calculated)  48.92    Triceps Skinfold  28 mm    % Body Fat  42.8 %    Grip Strength  38 kg    Flexibility  9.5 in    Single Leg Stand  30 seconds        Nutrition:   Nutrition Discharge: Nutrition Assessments - 11/10/19 1146      MEDFICTS Scores   Pre Score  9       Education Questionnaire Score: Knowledge Questionnaire Score - 09/09/19 0940      Knowledge Questionnaire Score   Pre Score  24/24

## 2019-12-16 MED FILL — ATORVASTATIN 80 MG TABLET: 80 | 30 days supply | Qty: 30 | Fill #3

## 2019-12-23 ENCOUNTER — Ambulatory Visit: Payer: BC Managed Care – PPO | Admitting: Pharmacist

## 2019-12-29 MED FILL — SILDENAFIL CITRATE 100 MG T: 100 | 10 days supply | Qty: 10 | Fill #2

## 2020-01-08 ENCOUNTER — Encounter: Payer: Self-pay | Admitting: Family Medicine

## 2020-01-08 MED FILL — BRILINTA 90 MG TABLET: 90 | 30 days supply | Qty: 60 | Fill #4

## 2020-01-12 NOTE — Telephone Encounter (Signed)
I think that would be okay. The main thing that would stop the bleeding is direct pressure with the hands for 20 minutes each

## 2020-01-18 ENCOUNTER — Telehealth: Payer: Self-pay | Admitting: Student

## 2020-01-18 NOTE — Telephone Encounter (Signed)
     The patient called the after-hours line reporting blood in his urine yesterday evening. He reports this happened twice and he did have some pooling of blood along the tip of his penis. He applied pressure and the bleeding eventually stopped. Denies any trauma to the site but had been traveling to [place], [place] throughout the day. He has urinated multiple times this morning and denies any recurrent hematuria.  I recommended that he continue on Brilinta for now and not miss any doses given recent stent placement. He is on monotherapy with Brilinta by review of most recent office notes and is no longer on ASA. If he has recurrent hematuria, I recommended he reach out to his PCP as he would likely need a referral to Urology for further evaluation.  He voiced understanding of this and was appreciative of the return call. Will forward to Dr. Irish Lack as an Juluis Rainier.  Signed, Erma Heritage, PA-C 01/18/2020, 11:44 AM Pager: [phone]

## 2020-01-19 NOTE — Telephone Encounter (Signed)
If hematuria persists, could consider switching to clopidogrel 75 mg daily.    JV

## 2020-01-19 NOTE — Telephone Encounter (Signed)
Patient notified. He has had no more bleeding.  He will let us know if bleeding returns.

## 2020-01-20 MED FILL — SILDENAFIL CITRATE 100 MG T: 100 | 10 days supply | Qty: 10 | Fill #3

## 2020-01-20 MED FILL — ATORVASTATIN 80 MG TABLET: 80 | 30 days supply | Qty: 30 | Fill #4

## 2020-02-04 MED FILL — DESCOVY 200-25 MG TABS: 200-25 | 30 days supply | Qty: 30 | Fill #1

## 2020-02-09 MED FILL — METOPROLOL SUCCINATE ER 25: 25 | 90 days supply | Qty: 90 | Fill #2

## 2020-02-09 MED FILL — BRILINTA 90 MG TABLET: 90 | 30 days supply | Qty: 60 | Fill #5

## 2020-03-11 MED FILL — SILDENAFIL CITRATE 100 MG T: 100 | 10 days supply | Qty: 10 | Fill #5

## 2020-03-11 MED FILL — BRILINTA 90 MG TABLET: 90 | 30 days supply | Qty: 60 | Fill #6

## 2020-03-30 MED FILL — DESCOVY 200-25 MG TABS: 200-25 | 30 days supply | Qty: 30 | Fill #2

## 2020-03-30 MED FILL — ATORVASTATIN 80 MG TABLET: 80 | 30 days supply | Qty: 30 | Fill #6

## 2020-03-31 MED FILL — SILDENAFIL CITRATE 100 MG T: 100 | 10 days supply | Qty: 10 | Fill #6

## 2020-04-06 ENCOUNTER — Other Ambulatory Visit: Payer: Self-pay

## 2020-04-06 ENCOUNTER — Other Ambulatory Visit: Payer: Self-pay | Admitting: Interventional Cardiology

## 2020-04-06 ENCOUNTER — Other Ambulatory Visit (HOSPITAL_COMMUNITY): Payer: Self-pay | Admitting: Cardiovascular Disease

## 2020-04-06 MED ORDER — ATORVASTATIN CALCIUM 80 MG PO TABS: 80.0000 mg | ORAL_TABLET | Freq: Every day | ORAL | 1 refills | Status: DC

## 2020-04-06 MED ORDER — TICAGRELOR 90 MG PO TABS: 90.0000 mg | ORAL_TABLET | Freq: Two times a day (BID) | ORAL | 1 refills | Status: DC

## 2020-04-06 NOTE — Telephone Encounter (Signed)
*  STAT* If patient is at the pharmacy, call can be transferred to refill team.   1. Which medications need to be refilled? (please list name of each medication and dose if known) atorvastatin (LIPITOR) 80 MG tablet ticagrelor (BRILINTA) 90 MG TABS tablet 2. Which pharmacy/location (including street and city if local pharmacy) is medication to be sent to? [facility] [place], [place] - [place]  3. Do they need a 30 day or 90 day supply? 90 day supply

## 2020-04-07 ENCOUNTER — Other Ambulatory Visit (HOSPITAL_COMMUNITY): Payer: Self-pay | Admitting: Interventional Cardiology

## 2020-04-07 ENCOUNTER — Other Ambulatory Visit: Payer: Self-pay | Admitting: *Deleted

## 2020-04-07 MED ORDER — METOPROLOL SUCCINATE ER 25 MG PO TB24: 25.0000 mg | ORAL_TABLET | Freq: Every day | ORAL | 0 refills | Status: DC

## 2020-04-07 MED FILL — BRILINTA 90 MG TABLET: 90 | 90 days supply | Qty: 180 | Fill #0

## 2020-04-07 NOTE — Addendum Note (Signed)
Addended by: Juventino Slovak on: 04/07/2020 10:35 AM   Modules accepted: Orders

## 2020-04-08 MED FILL — SILDENAFIL CITRATE 100 MG T: 100 | 10 days supply | Qty: 10 | Fill #7

## 2020-04-12 ENCOUNTER — Telehealth: Payer: Self-pay | Admitting: Interventional Cardiology

## 2020-04-12 MED ORDER — TICAGRELOR 90 MG PO TABS: 90.0000 mg | ORAL_TABLET | Freq: Two times a day (BID) | ORAL | 0 refills | Status: DC

## 2020-04-12 NOTE — Telephone Encounter (Signed)
*  STAT* If patient is at the pharmacy, call can be transferred to refill team.   1. Which medications need to be refilled? (please list name of each medication and dose if known)  ticagrelor (BRILINTA) 90 MG TABS tablet  2. Which pharmacy/location (including street and city if local pharmacy) is medication to be sent to? [facility] [address], [place] [address]  3. Do they need a 30 day or 90 day supply? 3 day supply    Jeremy Guerrero is currently in [place] and is waiting on his mail order that won't get to him for another 3 more days and he is completely out of medication. He is requesting a 3 day supply to hold him until it arrives.

## 2020-04-12 NOTE — Telephone Encounter (Signed)
Pt's medication was sent to pt's pharmacy as requested for a short supply until mail order arrives. Confirmation received.

## 2020-04-13 ENCOUNTER — Other Ambulatory Visit (HOSPITAL_COMMUNITY): Payer: Self-pay | Admitting: Interventional Cardiology

## 2020-04-13 MED ORDER — TICAGRELOR 90 MG PO TABS: 90.0000 mg | ORAL_TABLET | Freq: Two times a day (BID) | ORAL | 1 refills | Status: DC

## 2020-04-13 MED ORDER — ATORVASTATIN CALCIUM 80 MG PO TABS: 80.0000 mg | ORAL_TABLET | Freq: Every day | ORAL | 0 refills | Status: DC

## 2020-04-13 MED ORDER — ATORVASTATIN CALCIUM 80 MG PO TABS: 80.0000 mg | ORAL_TABLET | Freq: Every day | ORAL | 1 refills | Status: DC

## 2020-04-13 NOTE — Telephone Encounter (Signed)
Pt's medications were sent to pt's preferred pharmacy as requested. Confirmation received.

## 2020-04-13 NOTE — Addendum Note (Signed)
Addended by: Carter Kitten D on: 04/13/2020 02:44 PM   Modules accepted: Orders

## 2020-04-13 NOTE — Telephone Encounter (Signed)
*  STAT* If patient is at the pharmacy, call can be transferred to refill team.   1. Which medications need to be refilled? (please list name of each medication and dose if known)  Brilinta- need enough until his arrives- pt is out of town- completely out of it  2. Which pharmacy/location (including street and city if local pharmacy) is medication to be sent to? [facility] [address] [phone]  3. Do they need a 30 day or 90 day supply? 10 please

## 2020-04-15 ENCOUNTER — Other Ambulatory Visit (HOSPITAL_COMMUNITY): Payer: Self-pay | Admitting: Family Medicine

## 2020-04-15 ENCOUNTER — Telehealth: Payer: Self-pay | Admitting: Family Medicine

## 2020-04-15 MED ORDER — SILDENAFIL CITRATE 100 MG PO TABS: 100.0000 mg | ORAL_TABLET | Freq: Every day | ORAL | 11 refills | Status: DC | PRN

## 2020-04-15 MED FILL — SILDENAFIL CITRATE 100 MG T: 100 | 10 days supply | Qty: 10 | Fill #0

## 2020-04-15 NOTE — Telephone Encounter (Signed)
Rx sent in. Left a detailed message on verified voice mail informing the patient that his medication has been sent in.

## 2020-04-15 NOTE — Telephone Encounter (Signed)
Patient needs Rx for 3 months supply because his insurance is getting ready to end.  sildenafil (VIAGRA) 100 MG tablet  [place], [place] Phone:  [phone]  Fax:  [phone]

## 2020-04-15 NOTE — Addendum Note (Signed)
Addended by: Rebecca Eaton on: 04/15/2020 09:22 AM   Modules accepted: Orders

## 2020-05-10 MED FILL — ATORVASTATIN 80 MG TABLET: 80 | 90 days supply | Qty: 90 | Fill #0

## 2020-05-10 MED FILL — METOPROLOL SUCCINATE ER 25: 25 | 90 days supply | Qty: 90 | Fill #3

## 2020-07-13 MED FILL — SILDENAFIL CITRATE 100 MG T: 100 | 10 days supply | Qty: 10 | Fill #8

## 2020-07-27 ENCOUNTER — Other Ambulatory Visit: Payer: Self-pay

## 2020-07-27 MED ORDER — TICAGRELOR 90 MG PO TABS: 90.0000 mg | ORAL_TABLET | Freq: Two times a day (BID) | ORAL | 0 refills | Status: DC

## 2020-07-27 MED FILL — BRILINTA 90 MG TABLET: 90 | 90 days supply | Qty: 180 | Fill #0

## 2020-08-05 MED FILL — SILDENAFIL CITRATE 100 MG T: 100 | 10 days supply | Qty: 10 | Fill #9

## 2020-08-23 MED FILL — SILDENAFIL CITRATE 100 MG T: 100 | 10 days supply | Qty: 10 | Fill #10

## 2020-08-24 MED FILL — METOPROLOL SUCCINATE ER 25: 25 | 30 days supply | Qty: 30 | Fill #0

## 2020-08-24 MED FILL — ATORVASTATIN 80 MG TABLET: 80 | 30 days supply | Qty: 30 | Fill #1

## 2020-09-25 MED FILL — ATORVASTATIN 80 MG TABLET: 80 | 30 days supply | Qty: 30 | Fill #2

## 2020-09-25 MED FILL — METOPROLOL SUCCINATE ER 25: 25 | 30 days supply | Qty: 30 | Fill #1

## 2020-09-25 MED FILL — SILDENAFIL CITRATE 100 MG T: 100 | 10 days supply | Qty: 10 | Fill #11

## 2020-10-26 MED FILL — ATORVASTATIN 80 MG TABLET: 80 | 30 days supply | Qty: 30 | Fill #3

## 2020-10-26 MED FILL — METOPROLOL SUCCINATE ER 25: 25 | 30 days supply | Qty: 30 | Fill #2

## 2020-10-26 MED FILL — BRILINTA 90 MG TABLET: 90 | 90 days supply | Qty: 180 | Fill #1

## 2020-10-26 MED FILL — SILDENAFIL CITRATE 100 MG T: 100 | 10 days supply | Qty: 10 | Fill #1

## 2020-12-07 ENCOUNTER — Other Ambulatory Visit (HOSPITAL_BASED_OUTPATIENT_CLINIC_OR_DEPARTMENT_OTHER): Payer: Self-pay

## 2020-12-30 MED FILL — Sildenafil Citrate Tab 100 MG: ORAL | 30 days supply | Qty: 10 | Fill #0 | Status: AC

## 2020-12-31 ENCOUNTER — Other Ambulatory Visit (HOSPITAL_COMMUNITY): Payer: Self-pay

## 2021-01-05 ENCOUNTER — Other Ambulatory Visit (HOSPITAL_COMMUNITY): Payer: Self-pay

## 2021-01-19 ENCOUNTER — Other Ambulatory Visit (HOSPITAL_COMMUNITY): Payer: Self-pay

## 2021-01-19 MED FILL — Sildenafil Citrate Tab 100 MG: ORAL | 30 days supply | Qty: 10 | Fill #1 | Status: AC

## 2021-01-19 MED FILL — Atorvastatin Calcium Tab 80 MG (Base Equivalent): ORAL | 30 days supply | Qty: 30 | Fill #0 | Status: AC

## 2021-01-19 MED FILL — Metoprolol Succinate Tab ER 24HR 25 MG (Tartrate Equiv): ORAL | 30 days supply | Qty: 30 | Fill #0 | Status: AC

## 2021-01-21 ENCOUNTER — Other Ambulatory Visit (HOSPITAL_COMMUNITY): Payer: Self-pay

## 2021-02-04 ENCOUNTER — Other Ambulatory Visit (HOSPITAL_BASED_OUTPATIENT_CLINIC_OR_DEPARTMENT_OTHER): Payer: Self-pay

## 2021-02-24 ENCOUNTER — Other Ambulatory Visit (HOSPITAL_COMMUNITY): Payer: Self-pay

## 2021-02-24 ENCOUNTER — Other Ambulatory Visit: Payer: Self-pay | Admitting: Interventional Cardiology

## 2021-02-24 MED ORDER — TICAGRELOR 90 MG PO TABS
90.0000 mg | ORAL_TABLET | Freq: Two times a day (BID) | ORAL | 3 refills | Status: DC
  Filled 2021-02-24: qty 90, 45d supply, fill #0
  Filled 2021-03-17: qty 90, 45d supply, fill #1

## 2021-02-24 MED FILL — Sildenafil Citrate Tab 100 MG: ORAL | 30 days supply | Qty: 10 | Fill #2 | Status: AC

## 2021-02-24 MED FILL — Metoprolol Succinate Tab ER 24HR 25 MG (Tartrate Equiv): ORAL | 30 days supply | Qty: 30 | Fill #1 | Status: AC

## 2021-02-24 MED FILL — Atorvastatin Calcium Tab 80 MG (Base Equivalent): ORAL | 30 days supply | Qty: 30 | Fill #1 | Status: AC

## 2021-02-26 ENCOUNTER — Other Ambulatory Visit (HOSPITAL_COMMUNITY): Payer: Self-pay

## 2021-02-28 ENCOUNTER — Other Ambulatory Visit (HOSPITAL_COMMUNITY): Payer: Self-pay

## 2021-03-17 ENCOUNTER — Other Ambulatory Visit (HOSPITAL_COMMUNITY): Payer: Self-pay

## 2021-04-27 ENCOUNTER — Other Ambulatory Visit: Payer: Self-pay | Admitting: *Deleted

## 2021-04-27 MED ORDER — ATORVASTATIN CALCIUM 80 MG PO TABS: 80.0000 mg | ORAL_TABLET | Freq: Every day | ORAL | 0 refills | Status: DC

## 2021-05-02 ENCOUNTER — Telehealth: Payer: Self-pay | Admitting: Family Medicine

## 2021-05-02 NOTE — Telephone Encounter (Signed)
Patient called stating that he needs a refill of sildenafil (VIAGRA) 100 MG tablet to be sent to the pharmacy.  Patient says that he called the pharmacy and they stated that a refill request would be sent over.  Patient also stated that pharmacy says that the prescription is expired.  Please advise.

## 2021-05-03 NOTE — Telephone Encounter (Signed)
Patient was last seen 11/04/19.  Lvm for patient to call office to schedule an appointment.   Appointment needed before refill can be sent.

## 2021-05-09 ENCOUNTER — Telehealth (INDEPENDENT_AMBULATORY_CARE_PROVIDER_SITE_OTHER): Payer: No Typology Code available for payment source | Admitting: Family Medicine

## 2021-05-09 ENCOUNTER — Encounter: Payer: Self-pay | Admitting: Family Medicine

## 2021-05-09 DIAGNOSIS — G4700 Insomnia, unspecified: Secondary | ICD-10-CM

## 2021-05-09 MED ORDER — ZOLPIDEM TARTRATE 10 MG PO TABS
10.0000 mg | ORAL_TABLET | Freq: Every evening | ORAL | 5 refills | Status: AC | PRN
Start: 2021-05-09 — End: ?

## 2021-05-09 NOTE — Progress Notes (Signed)
Subjective:    Patient ID: Jeremy Guerrero, male    DOB: 16-Oct-1965, 55 y.o.   MRN: [id number]  HPI Virtual Visit via Video Note  I connected with the patient on 05/09/21 at  3:45 PM EDT by a video enabled telemedicine application and verified that I am speaking with the correct person using two identifiers.  Location patient: home Location provider:work or home office Persons participating in the virtual visit: patient, provider  I discussed the limitations of evaluation and management by telemedicine and the availability of in person appointments. The patient expressed understanding and agreed to proceed.   HPI: Here to follow up on insomnia. He asks for refills on Zolpidem. He finds that he cannot sleep with it. His job currently has him living in [place], [place] on Mondays through Friday, and then he comes home to [place] on the weekends.    ROS: See pertinent positives and negatives per HPI.  Past Medical History:  Diagnosis Date   History of adenomatous polyp of colon 06/02/2016   Hyperlipidemia    Hypertension    NSTEMI (non-ST elevated myocardial infarction) ([facility]) 08/05/2019   S/P angioplasty with stent 08/06/19 with DES to large dominant distal RCA  08/07/2019   Sciatica    Sleep apnea    wears cpap    Past Surgical History:  Procedure Laterality Date   COLONOSCOPY WITH PROPOFOL N/A 05/30/2016   per Dr. Carlean Purl, adenomatous polyps, repeat in 7 yrs    CORONARY STENT INTERVENTION N/A 08/06/2019   Procedure: CORONARY STENT INTERVENTION;  Surgeon: Troy Sine, MD;  Location: [facility];  Service: Cardiovascular;  Laterality: N/A;   KNEE SURGERY Left 2015   torn meniscus   LEFT HEART CATH N/A 08/06/2019   Procedure: Left Heart Cath;  Surgeon: Troy Sine, MD;  Location: [facility];  Service: Cardiovascular;  Laterality: N/A;   LEFT HEART CATH AND CORONARY ANGIOGRAPHY N/A 08/05/2019   Procedure: LEFT HEART CATH AND CORONARY ANGIOGRAPHY;  Surgeon:  Belva Crome, MD;  Location: [facility];  Service: Cardiovascular;  Laterality: N/A;    Family History  Problem Relation Age of Onset   Healthy Sister    Healthy Brother    Ovarian cancer Sister    Healthy Sister    Healthy Mother    Diabetes Father        Died at 19   Diabetes Maternal Grandfather    Heart attack Maternal Grandfather        Died in his early 67s   Heart attack Maternal Uncle    Cancer Maternal Uncle        throat   Colon cancer Neg Hx    Colon polyps Neg Hx    Esophageal cancer Neg Hx    Rectal cancer Neg Hx    Stomach cancer Neg Hx      Current Outpatient Medications:    acetaminophen (TYLENOL) 325 MG tablet, Take 2 tablets (650 mg total) by mouth every 4 (four) hours as needed for headache or mild pain., Disp: , Rfl:    atorvastatin (LIPITOR) 80 MG tablet, Take 1 tablet (80 mg total) by mouth daily at 6 PM., Disp: 30 tablet, Rfl: 0   emtricitabine-tenofovir AF (DESCOVY) 200-25 MG tablet, Take 1 tablet by mouth daily., Disp: 30 tablet, Rfl: 2   metoprolol succinate (TOPROL XL) 25 MG 24 hr tablet, Take 1 tablet (25 mg total) by mouth daily., Disp: 90 tablet, Rfl: 0   nitroGLYCERIN (  NITROSTAT) 0.4 MG SL tablet, Place 1 tablet (0.4 mg total) under the tongue every 5 (five) minutes x 3 doses as needed for chest pain., Disp: 25 tablet, Rfl: 4   sildenafil (VIAGRA) 100 MG tablet, Take 1 tablet (100 mg total) by mouth daily as needed for erectile dysfunction., Disp: 10 tablet, Rfl: 11   ticagrelor (BRILINTA) 90 MG TABS tablet, Take 1 tablet (90 mg total) by mouth 2 (two) times daily. Please make yearly appt with Dr. Irish Lack for February 2022 for future anymore refills. Thank you 1st attempt, Disp: 180 tablet, Rfl: 0   ticagrelor (BRILINTA) 90 MG TABS tablet, Take 1 tablet (90 mg total) by mouth 2 (two) times daily. PATIENT NEEDS TO SCHEDULE AN APPOINTMENT FOR FUTURE REFILLS., Disp: 90 tablet, Rfl: 3   zolpidem (AMBIEN) 10 MG tablet, Take 1 tablet (10 mg  total) by mouth at bedtime as needed for sleep., Disp: 30 tablet, Rfl: 5   metoprolol succinate (TOPROL-XL) 25 MG 24 hr tablet, TAKE 1 TABLET (25 MG TOTAL) BY MOUTH DAILY., Disp: 90 tablet, Rfl: 0   metoprolol succinate (TOPROL-XL) 25 MG 24 hr tablet, TAKE 1 TABLET BY MOUTH ONCE DAILY, Disp: 90 tablet, Rfl: 0   sildenafil (VIAGRA) 100 MG tablet, TAKE 1 TABLET BY MOUTH ONCE DAILY AS NEEDED FOR ERECTILE DYSFUNCTION, Disp: 10 tablet, Rfl: 11   sildenafil (VIAGRA) 100 MG tablet, TAKE 1 TABLET BY MOUTH ONCE DAILY AS NEEDED FOR ERECTILE DYSFUNCTION, Disp: 10 tablet, Rfl: 11  EXAM:  VITALS per patient if applicable:  GENERAL: alert, oriented, appears well and in no acute distress  HEENT: atraumatic, conjunttiva clear, no obvious abnormalities on inspection of external nose and ears  NECK: normal movements of the head and neck  LUNGS: on inspection no signs of respiratory distress, breathing rate appears normal, no obvious gross SOB, gasping or wheezing  CV: no obvious cyanosis  MS: moves all visible extremities without noticeable abnormality  PSYCH/NEURO: pleasant and cooperative, no obvious depression or anxiety, speech and thought processing grossly intact  ASSESSMENT AND PLAN: Insomnia, stable. We refilled Zolpidem 10 mg qhs.  Alysia Penna, MD  Discussed the following assessment and plan:  No diagnosis found.     I discussed the assessment and treatment plan with the patient. The patient was provided an opportunity to ask questions and all were answered. The patient agreed with the plan and demonstrated an understanding of the instructions.   The patient was advised to call back or seek an in-person evaluation if the symptoms worsen or if the condition fails to improve as anticipated.      Review of Systems     Objective:   Physical Exam        Assessment & Plan:

## 2021-05-20 ENCOUNTER — Telehealth: Payer: Self-pay | Admitting: Family Medicine

## 2021-05-20 MED ORDER — SILDENAFIL CITRATE 100 MG PO TABS: 50.0000 mg | ORAL_TABLET | Freq: Every day | ORAL | 0 refills | Status: DC | PRN

## 2021-05-20 NOTE — Telephone Encounter (Signed)
[facility] call: pt says sildenafil was supposed to have been refilled but states ambien was sent instead. Reviewed chart. I sent in sildenafil 100 mg, #10, no RF. Signed:  Crissie Sickles, MD           05/20/2021

## 2021-06-07 ENCOUNTER — Other Ambulatory Visit: Payer: Self-pay

## 2021-06-07 MED ORDER — METOPROLOL SUCCINATE ER 25 MG PO TB24: 25.0000 mg | ORAL_TABLET | Freq: Every day | ORAL | 0 refills | Status: DC

## 2021-07-16 ENCOUNTER — Other Ambulatory Visit: Payer: Self-pay | Admitting: Interventional Cardiology

## 2021-10-12 ENCOUNTER — Other Ambulatory Visit: Payer: Self-pay | Admitting: Interventional Cardiology

## 2021-10-12 ENCOUNTER — Telehealth: Payer: Self-pay | Admitting: Family Medicine

## 2021-10-12 ENCOUNTER — Other Ambulatory Visit: Payer: Self-pay | Admitting: Family Medicine

## 2021-10-12 NOTE — Telephone Encounter (Signed)
Patient called in to get a medication refill for sildenafil (VIAGRA) 100 MG tablet [[id number]] to be sent to his pharmacy.  Patient could be contacted at [phone].  Please advise.

## 2021-10-13 NOTE — Telephone Encounter (Signed)
I defer this RF request to Dr. Sarajane Jews since he is pt's PCP.

## 2021-10-13 NOTE — Telephone Encounter (Signed)
Pt request for refill on Sildenafil 100 mg, Please advise

## 2021-10-14 ENCOUNTER — Telehealth: Payer: Self-pay | Admitting: Family Medicine

## 2021-10-14 ENCOUNTER — Other Ambulatory Visit: Payer: Self-pay

## 2021-10-14 ENCOUNTER — Other Ambulatory Visit (HOSPITAL_COMMUNITY): Payer: Self-pay

## 2021-10-14 MED ORDER — SILDENAFIL CITRATE 100 MG PO TABS: ORAL_TABLET | Freq: Every day | ORAL | 11 refills | Status: AC | PRN

## 2021-10-14 MED ORDER — SILDENAFIL CITRATE 100 MG PO TABS
ORAL_TABLET | Freq: Every day | ORAL | 11 refills | Status: DC | PRN
  Filled 2021-10-14: qty 10, 10d supply, fill #0

## 2021-10-14 NOTE — Telephone Encounter (Signed)
Sildenofil was sent to wrong pharmacy--[facility] [address] [facility] [place], [place]

## 2021-10-14 NOTE — Telephone Encounter (Signed)
Done

## 2021-10-14 NOTE — Telephone Encounter (Signed)
Already done, pt is aware

## 2021-10-14 NOTE — Telephone Encounter (Signed)
See messages

## 2021-10-14 NOTE — Telephone Encounter (Signed)
Lvm for pt that prescription has been sent to the pharmacy

## 2021-10-17 ENCOUNTER — Other Ambulatory Visit (HOSPITAL_COMMUNITY): Payer: Self-pay

## 2021-10-19 ENCOUNTER — Other Ambulatory Visit (HOSPITAL_COMMUNITY): Payer: Self-pay

## 2021-10-24 ENCOUNTER — Other Ambulatory Visit: Payer: Self-pay | Admitting: Family Medicine

## 2021-10-25 ENCOUNTER — Encounter: Payer: Self-pay | Admitting: Family Medicine

## 2021-10-25 NOTE — Telephone Encounter (Addendum)
PA complete Electrically faxed through Cover my Meds.  Your PA has been faxed to the plan as a paper copy. Please contact the plan directly if you haven't received a determination in a typical timeframe.  You will be notified of the determination via fax.  Contact plan to follow up on [id number]

## 2021-10-27 NOTE — Telephone Encounter (Signed)
Processed through old insurance information.   New 2023 insurance information needed.   Called patient lvm to scan new card through my chart, or call and give updated information.

## 2021-10-28 ENCOUNTER — Telehealth: Payer: Self-pay

## 2021-10-28 NOTE — Telephone Encounter (Signed)
Pt PA for Sildenafil was approved thru 10/27/2022

## 2021-10-31 ENCOUNTER — Telehealth: Payer: Self-pay

## 2021-10-31 ENCOUNTER — Other Ambulatory Visit: Payer: Self-pay | Admitting: Interventional Cardiology

## 2021-10-31 NOTE — Telephone Encounter (Signed)
Pt PA for Sildenafil was approved on 09/27/2021 through 10/27/2022

## 2021-11-10 ENCOUNTER — Encounter: Payer: Self-pay | Admitting: Family Medicine

## 2021-11-10 DIAGNOSIS — N529 Male erectile dysfunction, unspecified: Secondary | ICD-10-CM | POA: Insufficient documentation

## 2021-11-13 ENCOUNTER — Other Ambulatory Visit: Payer: Self-pay | Admitting: Interventional Cardiology

## 2021-11-26 ENCOUNTER — Other Ambulatory Visit: Payer: Self-pay | Admitting: Interventional Cardiology

## 2021-11-28 ENCOUNTER — Other Ambulatory Visit: Payer: Self-pay | Admitting: Interventional Cardiology

## 2021-11-28 MED ORDER — TICAGRELOR 90 MG PO TABS: 90.0000 mg | ORAL_TABLET | Freq: Two times a day (BID) | ORAL | 0 refills | Status: AC

## 2022-01-02 ENCOUNTER — Other Ambulatory Visit: Payer: Self-pay | Admitting: Interventional Cardiology

## 2022-01-04 ENCOUNTER — Other Ambulatory Visit: Payer: Self-pay | Admitting: Interventional Cardiology

## 2022-04-19 ENCOUNTER — Telehealth: Payer: Self-pay | Admitting: Family Medicine

## 2022-04-19 NOTE — Telephone Encounter (Signed)
Patient will be receiving care from [facility] and they would like to know if he has had a sleep study and if so would like the results, fax [phone]

## 2022-04-20 NOTE — Telephone Encounter (Signed)
Left detailed message on Mercedes regarding pt last sleep study, advised to call the office back

## 2022-04-21 ENCOUNTER — Telehealth: Payer: Self-pay | Admitting: Family Medicine

## 2022-04-21 NOTE — Telephone Encounter (Signed)
Kia called to see if Izora Gala can fax over pt sleep records from 2015. Kia stated she already provided nancy the fax #.   Please advise.

## 2022-04-21 NOTE — Telephone Encounter (Signed)
Done

## 2022-04-21 NOTE — Telephone Encounter (Signed)
Pt Sleep study OV notes and Results was faxed to [facility], as requested

## 2022-05-03 ENCOUNTER — Other Ambulatory Visit: Payer: Self-pay | Admitting: Interventional Cardiology

## 2023-08-01 ENCOUNTER — Encounter: Payer: Self-pay | Admitting: Internal Medicine
